# Patient Record
Sex: Female | Born: 1950 | Race: White | Hispanic: No | Smoking: Never smoker
Health system: Southern US, Community
[De-identification: ages and names within clinical notes are randomized; demographics above are authoritative.]

## PROBLEM LIST (undated history)

## (undated) ENCOUNTER — Emergency Department (HOSPITAL_COMMUNITY): Discharge status: Absent without leave | Payer: 59

## (undated) DIAGNOSIS — J705 Respiratory conditions due to smoke inhalation: Secondary | ICD-10-CM

## (undated) DIAGNOSIS — T7422XA Child sexual abuse, confirmed, initial encounter: Secondary | ICD-10-CM

## (undated) DIAGNOSIS — M545 Low back pain, unspecified: Secondary | ICD-10-CM

## (undated) DIAGNOSIS — G43909 Migraine, unspecified, not intractable, without status migrainosus: Secondary | ICD-10-CM

## (undated) DIAGNOSIS — C4490 Unspecified malignant neoplasm of skin, unspecified: Secondary | ICD-10-CM

## (undated) DIAGNOSIS — E785 Hyperlipidemia, unspecified: Secondary | ICD-10-CM

## (undated) DIAGNOSIS — K589 Irritable bowel syndrome without diarrhea: Secondary | ICD-10-CM

## (undated) DIAGNOSIS — M1990 Unspecified osteoarthritis, unspecified site: Secondary | ICD-10-CM

## (undated) DIAGNOSIS — IMO0002 Reserved for concepts with insufficient information to code with codable children: Secondary | ICD-10-CM

## (undated) DIAGNOSIS — M797 Fibromyalgia: Secondary | ICD-10-CM

## (undated) DIAGNOSIS — E039 Hypothyroidism, unspecified: Secondary | ICD-10-CM

## (undated) DIAGNOSIS — I10 Essential (primary) hypertension: Secondary | ICD-10-CM

## (undated) DIAGNOSIS — T39011A Poisoning by aspirin, accidental (unintentional), initial encounter: Secondary | ICD-10-CM

## (undated) DIAGNOSIS — K56609 Unspecified intestinal obstruction, unspecified as to partial versus complete obstruction: Secondary | ICD-10-CM

## (undated) DIAGNOSIS — G610 Guillain-Barre syndrome: Secondary | ICD-10-CM

## (undated) DIAGNOSIS — G8929 Other chronic pain: Secondary | ICD-10-CM

## (undated) DIAGNOSIS — J45909 Unspecified asthma, uncomplicated: Secondary | ICD-10-CM

## (undated) HISTORY — PX: ABDOMINAL HYSTERECTOMY: SHX81

## (undated) HISTORY — PX: APPENDECTOMY: SHX54

## (undated) HISTORY — DX: Other chronic pain: G89.29

## (undated) HISTORY — DX: Unspecified malignant neoplasm of skin, unspecified: C44.90

## (undated) HISTORY — DX: Irritable bowel syndrome, unspecified: K58.9

## (undated) HISTORY — DX: Child sexual abuse, confirmed, initial encounter: T74.22XA

## (undated) HISTORY — PX: OTHER SURGICAL HISTORY: SHX169

## (undated) HISTORY — DX: Unspecified intestinal obstruction, unspecified as to partial versus complete obstruction: K56.609

## (undated) HISTORY — DX: Unspecified osteoarthritis, unspecified site: M19.90

## (undated) HISTORY — DX: Hyperlipidemia, unspecified: E78.5

## (undated) HISTORY — DX: Low back pain, unspecified: M54.50

## (undated) HISTORY — DX: Hypothyroidism, unspecified: E03.9

## (undated) HISTORY — DX: Poisoning by aspirin, accidental (unintentional), initial encounter: T39.011A

## (undated) HISTORY — PX: TONSILLECTOMY: SUR1361

## (undated) HISTORY — DX: Low back pain: M54.5

## (undated) HISTORY — DX: Reserved for concepts with insufficient information to code with codable children: IMO0002

## (undated) HISTORY — DX: Migraine, unspecified, not intractable, without status migrainosus: G43.909

## (undated) HISTORY — DX: Fibromyalgia: M79.7

---

## 1989-04-24 DIAGNOSIS — K56609 Unspecified intestinal obstruction, unspecified as to partial versus complete obstruction: Secondary | ICD-10-CM

## 1989-04-24 HISTORY — DX: Unspecified intestinal obstruction, unspecified as to partial versus complete obstruction: K56.609

## 1997-09-24 ENCOUNTER — Emergency Department (HOSPITAL_COMMUNITY): Admission: EM | Admit: 1997-09-24 | Discharge: 1997-09-24 | Payer: Self-pay

## 1998-07-07 ENCOUNTER — Encounter: Payer: Self-pay | Admitting: *Deleted

## 1998-07-08 ENCOUNTER — Inpatient Hospital Stay (HOSPITAL_COMMUNITY): Admission: EM | Admit: 1998-07-08 | Discharge: 1998-07-09 | Payer: Self-pay | Admitting: *Deleted

## 1998-07-08 ENCOUNTER — Encounter: Payer: Self-pay | Admitting: Internal Medicine

## 1999-10-26 ENCOUNTER — Encounter: Payer: Self-pay | Admitting: Emergency Medicine

## 1999-10-26 ENCOUNTER — Inpatient Hospital Stay (HOSPITAL_COMMUNITY): Admission: EM | Admit: 1999-10-26 | Discharge: 1999-10-31 | Payer: Self-pay | Admitting: Emergency Medicine

## 1999-10-30 ENCOUNTER — Encounter: Payer: Self-pay | Admitting: Pulmonary Disease

## 1999-11-18 ENCOUNTER — Ambulatory Visit (HOSPITAL_COMMUNITY): Admission: RE | Admit: 1999-11-18 | Discharge: 1999-11-18 | Payer: Self-pay | Admitting: Internal Medicine

## 1999-11-20 ENCOUNTER — Encounter (INDEPENDENT_AMBULATORY_CARE_PROVIDER_SITE_OTHER): Payer: Self-pay | Admitting: *Deleted

## 1999-11-22 ENCOUNTER — Encounter: Payer: Self-pay | Admitting: Internal Medicine

## 1999-11-22 ENCOUNTER — Ambulatory Visit (HOSPITAL_COMMUNITY): Admission: RE | Admit: 1999-11-22 | Discharge: 1999-11-22 | Payer: Self-pay | Admitting: Internal Medicine

## 1999-12-19 ENCOUNTER — Emergency Department (HOSPITAL_COMMUNITY): Admission: EM | Admit: 1999-12-19 | Discharge: 1999-12-19 | Payer: Self-pay

## 1999-12-27 ENCOUNTER — Encounter: Payer: Self-pay | Admitting: Internal Medicine

## 1999-12-27 ENCOUNTER — Ambulatory Visit (HOSPITAL_COMMUNITY): Admission: RE | Admit: 1999-12-27 | Discharge: 1999-12-27 | Payer: Self-pay | Admitting: Internal Medicine

## 2000-01-23 ENCOUNTER — Encounter (INDEPENDENT_AMBULATORY_CARE_PROVIDER_SITE_OTHER): Payer: Self-pay | Admitting: *Deleted

## 2000-01-23 ENCOUNTER — Encounter: Payer: Self-pay | Admitting: Surgery

## 2000-01-23 ENCOUNTER — Inpatient Hospital Stay (HOSPITAL_COMMUNITY): Admission: RE | Admit: 2000-01-23 | Discharge: 2000-02-01 | Payer: Self-pay | Admitting: Surgery

## 2000-05-24 ENCOUNTER — Encounter: Admission: RE | Admit: 2000-05-24 | Discharge: 2000-05-24 | Payer: Self-pay | Admitting: Otolaryngology

## 2000-05-24 ENCOUNTER — Encounter: Payer: Self-pay | Admitting: Otolaryngology

## 2000-09-03 ENCOUNTER — Encounter: Payer: Self-pay | Admitting: Emergency Medicine

## 2000-09-03 ENCOUNTER — Emergency Department (HOSPITAL_COMMUNITY): Admission: EM | Admit: 2000-09-03 | Discharge: 2000-09-03 | Payer: Self-pay | Admitting: Emergency Medicine

## 2000-11-21 ENCOUNTER — Other Ambulatory Visit: Admission: RE | Admit: 2000-11-21 | Discharge: 2000-11-21 | Payer: Self-pay | Admitting: Obstetrics and Gynecology

## 2001-02-10 ENCOUNTER — Inpatient Hospital Stay (HOSPITAL_COMMUNITY): Admission: EM | Admit: 2001-02-10 | Discharge: 2001-02-14 | Payer: Self-pay | Admitting: Emergency Medicine

## 2001-02-10 ENCOUNTER — Encounter: Payer: Self-pay | Admitting: Emergency Medicine

## 2001-02-11 ENCOUNTER — Encounter: Payer: Self-pay | Admitting: Family Medicine

## 2001-02-13 ENCOUNTER — Encounter: Payer: Self-pay | Admitting: Internal Medicine

## 2001-09-11 ENCOUNTER — Inpatient Hospital Stay (HOSPITAL_COMMUNITY): Admission: EM | Admit: 2001-09-11 | Discharge: 2001-09-16 | Payer: Self-pay | Admitting: *Deleted

## 2001-09-11 ENCOUNTER — Encounter: Payer: Self-pay | Admitting: Internal Medicine

## 2001-09-14 ENCOUNTER — Encounter: Payer: Self-pay | Admitting: Internal Medicine

## 2001-12-18 ENCOUNTER — Other Ambulatory Visit: Admission: RE | Admit: 2001-12-18 | Discharge: 2001-12-18 | Payer: Self-pay | Admitting: Obstetrics and Gynecology

## 2001-12-24 ENCOUNTER — Ambulatory Visit (HOSPITAL_BASED_OUTPATIENT_CLINIC_OR_DEPARTMENT_OTHER): Admission: RE | Admit: 2001-12-24 | Discharge: 2001-12-24 | Payer: Self-pay | Admitting: Plastic Surgery

## 2001-12-24 ENCOUNTER — Encounter (INDEPENDENT_AMBULATORY_CARE_PROVIDER_SITE_OTHER): Payer: Self-pay | Admitting: Specialist

## 2002-09-10 ENCOUNTER — Encounter: Payer: Self-pay | Admitting: Emergency Medicine

## 2002-09-10 ENCOUNTER — Inpatient Hospital Stay (HOSPITAL_COMMUNITY): Admission: EM | Admit: 2002-09-10 | Discharge: 2002-09-12 | Payer: Self-pay | Admitting: Emergency Medicine

## 2002-09-11 ENCOUNTER — Encounter: Payer: Self-pay | Admitting: *Deleted

## 2003-07-06 ENCOUNTER — Observation Stay (HOSPITAL_COMMUNITY): Admission: EM | Admit: 2003-07-06 | Discharge: 2003-07-07 | Payer: Self-pay

## 2003-07-10 ENCOUNTER — Emergency Department (HOSPITAL_COMMUNITY): Admission: EM | Admit: 2003-07-10 | Discharge: 2003-07-11 | Payer: Self-pay | Admitting: Emergency Medicine

## 2003-08-04 ENCOUNTER — Emergency Department (HOSPITAL_COMMUNITY): Admission: EM | Admit: 2003-08-04 | Discharge: 2003-08-04 | Payer: Self-pay | Admitting: Emergency Medicine

## 2003-08-27 ENCOUNTER — Encounter: Payer: Self-pay | Admitting: Internal Medicine

## 2003-08-28 ENCOUNTER — Encounter (INDEPENDENT_AMBULATORY_CARE_PROVIDER_SITE_OTHER): Payer: Self-pay | Admitting: Specialist

## 2003-08-28 ENCOUNTER — Encounter: Payer: Self-pay | Admitting: Internal Medicine

## 2003-08-28 ENCOUNTER — Ambulatory Visit (HOSPITAL_COMMUNITY): Admission: RE | Admit: 2003-08-28 | Discharge: 2003-08-28 | Payer: Self-pay | Admitting: Internal Medicine

## 2004-01-01 ENCOUNTER — Encounter (INDEPENDENT_AMBULATORY_CARE_PROVIDER_SITE_OTHER): Payer: Self-pay | Admitting: *Deleted

## 2004-01-01 ENCOUNTER — Emergency Department (HOSPITAL_COMMUNITY): Admission: EM | Admit: 2004-01-01 | Discharge: 2004-01-01 | Payer: Self-pay | Admitting: Emergency Medicine

## 2004-01-02 ENCOUNTER — Emergency Department (HOSPITAL_COMMUNITY): Admission: EM | Admit: 2004-01-02 | Discharge: 2004-01-02 | Payer: Self-pay | Admitting: Emergency Medicine

## 2004-01-03 ENCOUNTER — Inpatient Hospital Stay (HOSPITAL_COMMUNITY): Admission: RE | Admit: 2004-01-03 | Discharge: 2004-01-19 | Payer: Self-pay | Admitting: Internal Medicine

## 2004-01-04 ENCOUNTER — Encounter: Payer: Self-pay | Admitting: Gastroenterology

## 2004-01-05 ENCOUNTER — Encounter (INDEPENDENT_AMBULATORY_CARE_PROVIDER_SITE_OTHER): Payer: Self-pay | Admitting: *Deleted

## 2004-01-07 ENCOUNTER — Encounter: Payer: Self-pay | Admitting: Internal Medicine

## 2004-01-07 ENCOUNTER — Encounter (INDEPENDENT_AMBULATORY_CARE_PROVIDER_SITE_OTHER): Payer: Self-pay | Admitting: *Deleted

## 2004-01-12 ENCOUNTER — Encounter (INDEPENDENT_AMBULATORY_CARE_PROVIDER_SITE_OTHER): Payer: Self-pay | Admitting: *Deleted

## 2004-01-19 ENCOUNTER — Encounter (INDEPENDENT_AMBULATORY_CARE_PROVIDER_SITE_OTHER): Payer: Self-pay | Admitting: *Deleted

## 2004-01-27 ENCOUNTER — Emergency Department (HOSPITAL_COMMUNITY): Admission: EM | Admit: 2004-01-27 | Discharge: 2004-01-27 | Payer: Self-pay

## 2004-02-04 ENCOUNTER — Ambulatory Visit: Payer: Self-pay | Admitting: Physician Assistant

## 2004-02-10 ENCOUNTER — Ambulatory Visit (HOSPITAL_COMMUNITY): Admission: RE | Admit: 2004-02-10 | Discharge: 2004-02-10 | Payer: Self-pay | Admitting: Internal Medicine

## 2004-02-10 ENCOUNTER — Encounter: Payer: Self-pay | Admitting: Internal Medicine

## 2004-02-19 ENCOUNTER — Ambulatory Visit: Payer: Self-pay | Admitting: Physician Assistant

## 2004-03-04 ENCOUNTER — Ambulatory Visit: Payer: Self-pay | Admitting: Physician Assistant

## 2004-03-07 ENCOUNTER — Encounter: Admission: RE | Admit: 2004-03-07 | Discharge: 2004-03-07 | Payer: Self-pay

## 2004-03-08 ENCOUNTER — Ambulatory Visit: Payer: Self-pay | Admitting: Physician Assistant

## 2004-04-06 ENCOUNTER — Ambulatory Visit: Payer: Self-pay | Admitting: Physician Assistant

## 2004-05-27 ENCOUNTER — Ambulatory Visit: Payer: Self-pay | Admitting: Physician Assistant

## 2004-06-23 ENCOUNTER — Ambulatory Visit: Payer: Self-pay | Admitting: Physician Assistant

## 2004-07-21 ENCOUNTER — Ambulatory Visit: Payer: Self-pay | Admitting: Physician Assistant

## 2004-09-01 ENCOUNTER — Ambulatory Visit: Payer: Self-pay | Admitting: Physician Assistant

## 2004-10-12 ENCOUNTER — Ambulatory Visit: Payer: Self-pay | Admitting: Physician Assistant

## 2004-11-10 ENCOUNTER — Ambulatory Visit: Payer: Self-pay | Admitting: Physician Assistant

## 2004-12-06 ENCOUNTER — Ambulatory Visit: Payer: Self-pay | Admitting: Internal Medicine

## 2004-12-08 ENCOUNTER — Ambulatory Visit: Payer: Self-pay | Admitting: Physician Assistant

## 2005-01-16 ENCOUNTER — Ambulatory Visit: Payer: Self-pay | Admitting: Physician Assistant

## 2005-01-24 ENCOUNTER — Ambulatory Visit: Payer: Self-pay | Admitting: Physician Assistant

## 2005-02-20 ENCOUNTER — Ambulatory Visit: Payer: Self-pay | Admitting: Internal Medicine

## 2005-02-21 ENCOUNTER — Ambulatory Visit: Payer: Self-pay | Admitting: Physician Assistant

## 2005-03-22 ENCOUNTER — Ambulatory Visit: Payer: Self-pay | Admitting: Physician Assistant

## 2005-04-14 ENCOUNTER — Ambulatory Visit: Payer: Self-pay | Admitting: Physician Assistant

## 2005-04-24 HISTORY — PX: CHOLECYSTECTOMY: SHX55

## 2005-05-22 ENCOUNTER — Ambulatory Visit: Payer: Self-pay | Admitting: Physician Assistant

## 2005-06-02 ENCOUNTER — Ambulatory Visit: Payer: Self-pay | Admitting: Internal Medicine

## 2005-06-23 ENCOUNTER — Ambulatory Visit: Payer: Self-pay | Admitting: Physician Assistant

## 2005-07-12 ENCOUNTER — Inpatient Hospital Stay (HOSPITAL_COMMUNITY): Admission: EM | Admit: 2005-07-12 | Discharge: 2005-07-18 | Payer: Self-pay | Admitting: Emergency Medicine

## 2005-07-12 ENCOUNTER — Ambulatory Visit: Payer: Self-pay | Admitting: Physical Medicine & Rehabilitation

## 2005-08-02 ENCOUNTER — Ambulatory Visit: Payer: Self-pay | Admitting: Physician Assistant

## 2005-08-05 ENCOUNTER — Encounter: Payer: Self-pay | Admitting: Vascular Surgery

## 2005-08-05 ENCOUNTER — Ambulatory Visit: Payer: Self-pay | Admitting: Internal Medicine

## 2005-08-05 ENCOUNTER — Inpatient Hospital Stay (HOSPITAL_COMMUNITY): Admission: EM | Admit: 2005-08-05 | Discharge: 2005-08-08 | Payer: Self-pay | Admitting: Emergency Medicine

## 2005-08-23 ENCOUNTER — Ambulatory Visit: Payer: Self-pay | Admitting: Internal Medicine

## 2005-08-29 ENCOUNTER — Ambulatory Visit: Payer: Self-pay | Admitting: Physician Assistant

## 2005-09-27 ENCOUNTER — Ambulatory Visit: Payer: Self-pay | Admitting: Physician Assistant

## 2005-10-27 ENCOUNTER — Ambulatory Visit: Payer: Self-pay | Admitting: Physician Assistant

## 2005-11-23 ENCOUNTER — Ambulatory Visit: Payer: Self-pay | Admitting: Physician Assistant

## 2006-01-10 ENCOUNTER — Ambulatory Visit: Payer: Self-pay | Admitting: Physician Assistant

## 2006-02-08 ENCOUNTER — Ambulatory Visit: Payer: Self-pay | Admitting: Physician Assistant

## 2006-02-19 ENCOUNTER — Emergency Department (HOSPITAL_COMMUNITY): Admission: EM | Admit: 2006-02-19 | Discharge: 2006-02-19 | Payer: Self-pay | Admitting: Emergency Medicine

## 2006-03-13 ENCOUNTER — Ambulatory Visit: Payer: Self-pay | Admitting: Physician Assistant

## 2006-03-14 ENCOUNTER — Emergency Department: Payer: Self-pay | Admitting: Emergency Medicine

## 2006-03-28 ENCOUNTER — Ambulatory Visit: Payer: Self-pay | Admitting: Internal Medicine

## 2006-04-02 ENCOUNTER — Inpatient Hospital Stay (HOSPITAL_COMMUNITY): Admission: EM | Admit: 2006-04-02 | Discharge: 2006-04-11 | Payer: Self-pay | Admitting: Emergency Medicine

## 2006-04-02 ENCOUNTER — Ambulatory Visit: Payer: Self-pay | Admitting: Internal Medicine

## 2006-04-05 ENCOUNTER — Ambulatory Visit: Payer: Self-pay | Admitting: Physical Medicine & Rehabilitation

## 2006-04-13 ENCOUNTER — Ambulatory Visit: Payer: Self-pay | Admitting: Internal Medicine

## 2006-05-04 ENCOUNTER — Ambulatory Visit: Payer: Self-pay | Admitting: Internal Medicine

## 2006-05-16 ENCOUNTER — Ambulatory Visit: Payer: Self-pay | Admitting: Internal Medicine

## 2006-06-14 ENCOUNTER — Ambulatory Visit: Payer: Self-pay | Admitting: Internal Medicine

## 2006-07-09 ENCOUNTER — Ambulatory Visit: Payer: Self-pay | Admitting: Internal Medicine

## 2006-07-09 LAB — CONVERTED CEMR LAB
Leukocytes, UA: NEGATIVE
Urine Glucose: NEGATIVE mg/dL
Urobilinogen, UA: 0.2 (ref 0.0–1.0)

## 2006-07-18 ENCOUNTER — Ambulatory Visit: Payer: Self-pay | Admitting: Internal Medicine

## 2006-08-27 ENCOUNTER — Ambulatory Visit: Payer: Self-pay | Admitting: Internal Medicine

## 2006-11-15 DIAGNOSIS — F329 Major depressive disorder, single episode, unspecified: Secondary | ICD-10-CM

## 2006-11-15 DIAGNOSIS — Z85828 Personal history of other malignant neoplasm of skin: Secondary | ICD-10-CM

## 2006-11-29 ENCOUNTER — Ambulatory Visit: Payer: Self-pay | Admitting: Internal Medicine

## 2007-03-14 ENCOUNTER — Ambulatory Visit: Payer: Self-pay | Admitting: Internal Medicine

## 2007-03-14 DIAGNOSIS — G9332 Myalgic encephalomyelitis/chronic fatigue syndrome: Secondary | ICD-10-CM | POA: Insufficient documentation

## 2007-03-14 DIAGNOSIS — M797 Fibromyalgia: Secondary | ICD-10-CM

## 2007-03-14 DIAGNOSIS — M51379 Other intervertebral disc degeneration, lumbosacral region without mention of lumbar back pain or lower extremity pain: Secondary | ICD-10-CM | POA: Insufficient documentation

## 2007-03-14 DIAGNOSIS — R5382 Chronic fatigue, unspecified: Secondary | ICD-10-CM

## 2007-03-14 DIAGNOSIS — M5137 Other intervertebral disc degeneration, lumbosacral region: Secondary | ICD-10-CM

## 2007-03-14 DIAGNOSIS — E039 Hypothyroidism, unspecified: Secondary | ICD-10-CM | POA: Insufficient documentation

## 2007-03-14 LAB — CONVERTED CEMR LAB: TSH: 1.55 microintl units/mL (ref 0.35–5.50)

## 2007-04-06 ENCOUNTER — Encounter: Payer: Self-pay | Admitting: Family Medicine

## 2007-04-10 ENCOUNTER — Telehealth: Payer: Self-pay | Admitting: Internal Medicine

## 2007-06-13 ENCOUNTER — Telehealth (INDEPENDENT_AMBULATORY_CARE_PROVIDER_SITE_OTHER): Payer: Self-pay | Admitting: *Deleted

## 2007-06-17 ENCOUNTER — Ambulatory Visit: Payer: Self-pay | Admitting: Internal Medicine

## 2007-06-17 DIAGNOSIS — R635 Abnormal weight gain: Secondary | ICD-10-CM | POA: Insufficient documentation

## 2007-06-17 LAB — CONVERTED CEMR LAB
Amylase: 74 units/L (ref 27–131)
Bilirubin, Direct: 0.2 mg/dL (ref 0.0–0.3)
Lipase: 19 units/L (ref 11.0–59.0)
MCHC: 33.7 g/dL (ref 30.0–36.0)
MCV: 85 fL (ref 78.0–100.0)
RBC: 4.45 M/uL (ref 3.87–5.11)
RDW: 12.9 % (ref 11.5–14.6)
TSH: 3.63 microintl units/mL (ref 0.35–5.50)

## 2007-06-18 ENCOUNTER — Telehealth: Payer: Self-pay | Admitting: Internal Medicine

## 2007-06-18 ENCOUNTER — Encounter: Payer: Self-pay | Admitting: Internal Medicine

## 2007-07-19 ENCOUNTER — Telehealth: Payer: Self-pay | Admitting: Family Medicine

## 2007-07-19 ENCOUNTER — Ambulatory Visit: Payer: Self-pay | Admitting: Internal Medicine

## 2007-07-20 ENCOUNTER — Ambulatory Visit: Payer: Self-pay | Admitting: Family Medicine

## 2007-07-22 ENCOUNTER — Telehealth: Payer: Self-pay | Admitting: Internal Medicine

## 2007-08-23 ENCOUNTER — Ambulatory Visit: Payer: Self-pay | Admitting: Internal Medicine

## 2007-08-23 ENCOUNTER — Inpatient Hospital Stay (HOSPITAL_COMMUNITY): Admission: EM | Admit: 2007-08-23 | Discharge: 2007-08-27 | Payer: Self-pay | Admitting: Emergency Medicine

## 2007-08-23 DIAGNOSIS — R079 Chest pain, unspecified: Secondary | ICD-10-CM

## 2007-08-26 ENCOUNTER — Encounter: Payer: Self-pay | Admitting: Internal Medicine

## 2007-09-12 ENCOUNTER — Encounter: Payer: Self-pay | Admitting: Internal Medicine

## 2007-09-12 ENCOUNTER — Ambulatory Visit: Payer: Self-pay | Admitting: Internal Medicine

## 2007-09-12 ENCOUNTER — Other Ambulatory Visit: Admission: RE | Admit: 2007-09-12 | Discharge: 2007-09-12 | Payer: Self-pay | Admitting: Internal Medicine

## 2007-09-13 ENCOUNTER — Telehealth: Payer: Self-pay | Admitting: Internal Medicine

## 2007-09-17 ENCOUNTER — Ambulatory Visit: Payer: Self-pay | Admitting: Internal Medicine

## 2007-09-26 ENCOUNTER — Encounter: Payer: Self-pay | Admitting: Internal Medicine

## 2007-11-20 ENCOUNTER — Telehealth: Payer: Self-pay | Admitting: Internal Medicine

## 2007-11-22 ENCOUNTER — Ambulatory Visit: Payer: Self-pay | Admitting: Internal Medicine

## 2007-11-22 LAB — CONVERTED CEMR LAB
Calcium: 9.2 mg/dL (ref 8.4–10.5)
GFR calc Af Amer: 84 mL/min
Iron: 66 ug/dL (ref 42–145)
Monocytes Relative: 8 % (ref 3.0–12.0)
Neutrophils Relative %: 74 % (ref 43.0–77.0)
Platelets: 255 10*3/uL (ref 150–400)
Sodium: 137 meq/L (ref 135–145)
TSH: 4.03 microintl units/mL (ref 0.35–5.50)
Transferrin: 252.5 mg/dL (ref >=212.0)
WBC: 6.8 10*3/uL (ref 4.5–10.5)

## 2007-11-25 ENCOUNTER — Encounter: Payer: Self-pay | Admitting: Internal Medicine

## 2007-12-31 ENCOUNTER — Ambulatory Visit: Payer: Self-pay | Admitting: Internal Medicine

## 2008-01-08 ENCOUNTER — Ambulatory Visit (HOSPITAL_COMMUNITY): Admission: RE | Admit: 2008-01-08 | Discharge: 2008-01-08 | Payer: Self-pay | Admitting: Internal Medicine

## 2008-01-09 ENCOUNTER — Encounter: Payer: Self-pay | Admitting: Internal Medicine

## 2008-02-03 ENCOUNTER — Ambulatory Visit: Payer: Self-pay | Admitting: Internal Medicine

## 2008-02-03 DIAGNOSIS — B029 Zoster without complications: Secondary | ICD-10-CM | POA: Insufficient documentation

## 2008-05-13 ENCOUNTER — Ambulatory Visit: Payer: Self-pay | Admitting: Internal Medicine

## 2008-08-25 ENCOUNTER — Telehealth: Payer: Self-pay | Admitting: Internal Medicine

## 2008-09-22 ENCOUNTER — Ambulatory Visit: Payer: Self-pay | Admitting: Internal Medicine

## 2008-09-22 ENCOUNTER — Telehealth (INDEPENDENT_AMBULATORY_CARE_PROVIDER_SITE_OTHER): Payer: Self-pay | Admitting: *Deleted

## 2008-09-22 DIAGNOSIS — R498 Other voice and resonance disorders: Secondary | ICD-10-CM

## 2008-09-22 DIAGNOSIS — L500 Allergic urticaria: Secondary | ICD-10-CM

## 2008-09-22 LAB — CONVERTED CEMR LAB
ALT: 66 units/L — ABNORMAL HIGH (ref 0–35)
Albumin: 3.8 g/dL (ref 3.5–5.2)
BUN: 13 mg/dL (ref 6–23)
Basophils Relative: 0 % (ref 0.0–3.0)
Chloride: 107 meq/L (ref 96–112)
Cholesterol: 444 mg/dL — ABNORMAL HIGH (ref 0–200)
Creatinine, Ser: 0.8 mg/dL (ref 0.4–1.2)
Direct LDL: 136.1 mg/dL
Eosinophils Relative: 2.5 % (ref 0.0–5.0)
Glucose, Bld: 102 mg/dL — ABNORMAL HIGH (ref 70–99)
HDL: 35.7 mg/dL — ABNORMAL LOW (ref >39.00)
Lymphocytes Relative: 40.9 % (ref 12.0–46.0)
MCV: 84.6 fL (ref 78.0–100.0)
Monocytes Relative: 8.7 % (ref 3.0–12.0)
Neutrophils Relative %: 47.9 % (ref 43.0–77.0)
RBC: 4.1 M/uL (ref 3.87–5.11)
Total Bilirubin: 1.2 mg/dL (ref 0.3–1.2)
Total CHOL/HDL Ratio: 12
Total Protein: 7.6 g/dL (ref 6.0–8.3)
Triglycerides: 751 mg/dL — ABNORMAL HIGH (ref 0.0–149.0)
Vitamin B-12: 1305 pg/mL — ABNORMAL HIGH (ref 211–911)
WBC: 16.7 10*3/uL — ABNORMAL HIGH (ref 4.5–10.5)

## 2008-09-23 ENCOUNTER — Telehealth: Payer: Self-pay | Admitting: Internal Medicine

## 2008-09-24 ENCOUNTER — Ambulatory Visit: Payer: Self-pay | Admitting: Internal Medicine

## 2008-09-24 LAB — CONVERTED CEMR LAB
Amphetamine Screen, Ur: NEGATIVE
Marijuana Metabolite: NEGATIVE
Methadone: POSITIVE — AB
Nitrite: NEGATIVE
Opiates: NEGATIVE
Propoxyphene: NEGATIVE
Sed Rate: 35 mm/hr — ABNORMAL HIGH (ref 0–22)
Specific Gravity, Urine: <=1.005 (ref 1.000–1.030)
Urobilinogen, UA: 0.2 (ref 0.0–1.0)
pH: 6 (ref 5.0–8.0)

## 2008-09-25 ENCOUNTER — Telehealth (INDEPENDENT_AMBULATORY_CARE_PROVIDER_SITE_OTHER): Payer: Self-pay | Admitting: *Deleted

## 2008-10-01 ENCOUNTER — Ambulatory Visit: Payer: Self-pay | Admitting: Internal Medicine

## 2008-10-01 DIAGNOSIS — R740 Nonspecific elevation of levels of transaminase and lactic acid dehydrogenase [LDH]: Secondary | ICD-10-CM

## 2008-10-05 ENCOUNTER — Telehealth: Payer: Self-pay | Admitting: Internal Medicine

## 2008-10-13 ENCOUNTER — Telehealth: Payer: Self-pay | Admitting: Internal Medicine

## 2008-10-30 ENCOUNTER — Telehealth: Payer: Self-pay | Admitting: Internal Medicine

## 2008-11-26 ENCOUNTER — Ambulatory Visit: Payer: Self-pay | Admitting: Internal Medicine

## 2008-11-26 DIAGNOSIS — J40 Bronchitis, not specified as acute or chronic: Secondary | ICD-10-CM | POA: Insufficient documentation

## 2008-11-27 ENCOUNTER — Ambulatory Visit: Payer: Self-pay | Admitting: Internal Medicine

## 2008-11-27 ENCOUNTER — Telehealth: Payer: Self-pay | Admitting: Internal Medicine

## 2008-11-27 LAB — CONVERTED CEMR LAB
Anti Nuclear Antibody(ANA): NEGATIVE
Basophils Relative: 0 % (ref 0.0–3.0)
Hemoglobin: 12.4 g/dL (ref 12.0–15.0)
INR: 1 (ref 0.8–1.0)
Lymphocytes Relative: 34.1 % (ref 12.0–46.0)
MCHC: 34.9 g/dL (ref 30.0–36.0)
MCV: 84.7 fL (ref 78.0–100.0)
Neutrophils Relative %: 58.5 % (ref 43.0–77.0)
RBC: 4.21 M/uL (ref 3.87–5.11)
RDW: 13.9 % (ref 11.5–14.6)
aPTT: 28.5 s (ref 21.7–28.8)

## 2008-12-07 ENCOUNTER — Telehealth: Payer: Self-pay | Admitting: Internal Medicine

## 2008-12-10 ENCOUNTER — Ambulatory Visit: Payer: Self-pay | Admitting: Internal Medicine

## 2008-12-16 ENCOUNTER — Ambulatory Visit: Payer: Self-pay | Admitting: Pulmonary Disease

## 2008-12-17 ENCOUNTER — Ambulatory Visit: Payer: Self-pay | Admitting: Cardiovascular Disease

## 2008-12-25 ENCOUNTER — Encounter: Payer: Self-pay | Admitting: Pulmonary Disease

## 2008-12-25 ENCOUNTER — Ambulatory Visit (HOSPITAL_COMMUNITY): Admission: RE | Admit: 2008-12-25 | Discharge: 2008-12-25 | Payer: Self-pay | Admitting: Neurology

## 2008-12-29 ENCOUNTER — Ambulatory Visit: Payer: Self-pay | Admitting: Pulmonary Disease

## 2008-12-31 ENCOUNTER — Ambulatory Visit: Payer: Self-pay | Admitting: Internal Medicine

## 2009-01-01 ENCOUNTER — Telehealth: Payer: Self-pay | Admitting: Internal Medicine

## 2009-03-23 ENCOUNTER — Ambulatory Visit: Payer: Self-pay | Admitting: Internal Medicine

## 2009-03-23 DIAGNOSIS — R1013 Epigastric pain: Secondary | ICD-10-CM

## 2009-03-23 DIAGNOSIS — K3189 Other diseases of stomach and duodenum: Secondary | ICD-10-CM

## 2009-03-23 LAB — CONVERTED CEMR LAB
ALT: 33 units/L (ref 0–35)
AST: 34 units/L (ref 0–37)
Alkaline Phosphatase: 94 units/L (ref 39–117)
Basophils Absolute: 0 10*3/uL (ref 0.0–0.1)
Basophils Relative: 0.5 % (ref 0.0–3.0)
Eosinophils Absolute: 0.2 10*3/uL (ref 0.0–0.7)
HCT: 40.6 % (ref 36.0–46.0)
Hemoglobin: 13.5 g/dL (ref 12.0–15.0)
Lymphocytes Relative: 27 % (ref 12.0–46.0)
Lymphs Abs: 2.3 10*3/uL (ref 0.7–4.0)
MCHC: 33.2 g/dL (ref 30.0–36.0)
MCV: 87.4 fL (ref 78.0–100.0)
Monocytes Absolute: 0.5 10*3/uL (ref 0.1–1.0)
Neutro Abs: 5.7 10*3/uL (ref 1.4–7.7)
RBC: 4.65 M/uL (ref 3.87–5.11)
RDW: 13.2 % (ref 11.5–14.6)
Total Bilirubin: 0.5 mg/dL (ref 0.3–1.2)
Total CHOL/HDL Ratio: 9
Triglycerides: 976 mg/dL — ABNORMAL HIGH (ref 0.0–149.0)

## 2009-03-28 ENCOUNTER — Encounter: Payer: Self-pay | Admitting: Internal Medicine

## 2009-04-02 ENCOUNTER — Telehealth: Payer: Self-pay | Admitting: Internal Medicine

## 2009-04-27 ENCOUNTER — Encounter (INDEPENDENT_AMBULATORY_CARE_PROVIDER_SITE_OTHER): Payer: Self-pay | Admitting: *Deleted

## 2009-04-30 ENCOUNTER — Telehealth: Payer: Self-pay | Admitting: Internal Medicine

## 2009-05-01 ENCOUNTER — Ambulatory Visit: Payer: Self-pay | Admitting: Internal Medicine

## 2009-05-01 ENCOUNTER — Encounter (INDEPENDENT_AMBULATORY_CARE_PROVIDER_SITE_OTHER): Payer: Self-pay | Admitting: *Deleted

## 2009-05-01 ENCOUNTER — Telehealth: Payer: Self-pay | Admitting: Internal Medicine

## 2009-05-02 ENCOUNTER — Ambulatory Visit: Payer: Self-pay | Admitting: Internal Medicine

## 2009-05-02 ENCOUNTER — Inpatient Hospital Stay (HOSPITAL_COMMUNITY): Admission: EM | Admit: 2009-05-02 | Discharge: 2009-05-03 | Payer: Self-pay | Admitting: Emergency Medicine

## 2009-05-03 ENCOUNTER — Encounter: Payer: Self-pay | Admitting: Internal Medicine

## 2009-05-10 ENCOUNTER — Telehealth: Payer: Self-pay | Admitting: Internal Medicine

## 2009-05-11 ENCOUNTER — Ambulatory Visit: Payer: Self-pay | Admitting: Gastroenterology

## 2009-05-11 DIAGNOSIS — F411 Generalized anxiety disorder: Secondary | ICD-10-CM | POA: Insufficient documentation

## 2009-05-11 DIAGNOSIS — K219 Gastro-esophageal reflux disease without esophagitis: Secondary | ICD-10-CM

## 2009-05-11 DIAGNOSIS — R159 Full incontinence of feces: Secondary | ICD-10-CM | POA: Insufficient documentation

## 2009-05-11 DIAGNOSIS — K589 Irritable bowel syndrome without diarrhea: Secondary | ICD-10-CM | POA: Insufficient documentation

## 2009-05-12 ENCOUNTER — Telehealth: Payer: Self-pay | Admitting: Physician Assistant

## 2009-05-13 ENCOUNTER — Telehealth: Payer: Self-pay | Admitting: Physician Assistant

## 2009-05-31 ENCOUNTER — Telehealth: Payer: Self-pay | Admitting: Internal Medicine

## 2009-06-01 ENCOUNTER — Ambulatory Visit: Payer: Self-pay | Admitting: Gastroenterology

## 2009-06-14 ENCOUNTER — Encounter (INDEPENDENT_AMBULATORY_CARE_PROVIDER_SITE_OTHER): Payer: Self-pay | Admitting: *Deleted

## 2009-06-21 ENCOUNTER — Telehealth: Payer: Self-pay | Admitting: Internal Medicine

## 2009-06-21 ENCOUNTER — Ambulatory Visit: Payer: Self-pay | Admitting: Internal Medicine

## 2009-06-22 DIAGNOSIS — T39011A Poisoning by aspirin, accidental (unintentional), initial encounter: Secondary | ICD-10-CM

## 2009-06-22 HISTORY — DX: Poisoning by aspirin, accidental (unintentional), initial encounter: T39.011A

## 2009-06-30 ENCOUNTER — Emergency Department (HOSPITAL_COMMUNITY): Admission: EM | Admit: 2009-06-30 | Discharge: 2009-06-30 | Payer: Self-pay | Admitting: Emergency Medicine

## 2009-06-30 ENCOUNTER — Telehealth: Payer: Self-pay | Admitting: Internal Medicine

## 2009-06-30 ENCOUNTER — Encounter (INDEPENDENT_AMBULATORY_CARE_PROVIDER_SITE_OTHER): Payer: Self-pay | Admitting: *Deleted

## 2009-07-01 ENCOUNTER — Telehealth: Payer: Self-pay | Admitting: Internal Medicine

## 2009-07-01 ENCOUNTER — Encounter (INDEPENDENT_AMBULATORY_CARE_PROVIDER_SITE_OTHER): Payer: Self-pay | Admitting: *Deleted

## 2009-07-01 ENCOUNTER — Emergency Department (HOSPITAL_COMMUNITY): Admission: EM | Admit: 2009-07-01 | Discharge: 2009-07-01 | Payer: Self-pay | Admitting: Emergency Medicine

## 2009-07-02 ENCOUNTER — Telehealth: Payer: Self-pay | Admitting: Internal Medicine

## 2009-07-05 ENCOUNTER — Telehealth: Payer: Self-pay | Admitting: Internal Medicine

## 2009-07-06 ENCOUNTER — Telehealth: Payer: Self-pay | Admitting: Internal Medicine

## 2009-07-08 ENCOUNTER — Other Ambulatory Visit: Payer: Self-pay | Admitting: Emergency Medicine

## 2009-07-08 ENCOUNTER — Ambulatory Visit: Payer: Self-pay | Admitting: Internal Medicine

## 2009-07-08 ENCOUNTER — Ambulatory Visit: Payer: Self-pay | Admitting: Critical Care Medicine

## 2009-07-08 ENCOUNTER — Other Ambulatory Visit: Payer: Self-pay

## 2009-07-08 ENCOUNTER — Encounter (INDEPENDENT_AMBULATORY_CARE_PROVIDER_SITE_OTHER): Payer: Self-pay | Admitting: *Deleted

## 2009-07-08 ENCOUNTER — Inpatient Hospital Stay (HOSPITAL_COMMUNITY): Admission: EM | Admit: 2009-07-08 | Discharge: 2009-07-11 | Payer: Self-pay | Admitting: Critical Care Medicine

## 2009-07-08 ENCOUNTER — Telehealth: Payer: Self-pay | Admitting: Internal Medicine

## 2009-07-09 ENCOUNTER — Ambulatory Visit: Payer: Self-pay | Admitting: Internal Medicine

## 2009-07-10 ENCOUNTER — Encounter (INDEPENDENT_AMBULATORY_CARE_PROVIDER_SITE_OTHER): Payer: Self-pay | Admitting: *Deleted

## 2009-07-11 ENCOUNTER — Ambulatory Visit: Payer: Self-pay | Admitting: Psychiatry

## 2009-07-11 ENCOUNTER — Inpatient Hospital Stay (HOSPITAL_COMMUNITY): Admission: RE | Admit: 2009-07-11 | Discharge: 2009-07-13 | Payer: Self-pay | Admitting: Psychiatry

## 2009-07-13 ENCOUNTER — Encounter (INDEPENDENT_AMBULATORY_CARE_PROVIDER_SITE_OTHER): Payer: Self-pay | Admitting: *Deleted

## 2009-07-27 ENCOUNTER — Ambulatory Visit: Payer: Self-pay | Admitting: Internal Medicine

## 2009-08-09 ENCOUNTER — Encounter: Payer: Self-pay | Admitting: Internal Medicine

## 2009-08-09 ENCOUNTER — Ambulatory Visit: Payer: Self-pay | Admitting: Internal Medicine

## 2009-11-05 ENCOUNTER — Ambulatory Visit: Payer: Self-pay | Admitting: Internal Medicine

## 2009-12-01 ENCOUNTER — Telehealth: Payer: Self-pay | Admitting: Internal Medicine

## 2009-12-02 ENCOUNTER — Ambulatory Visit: Payer: Self-pay | Admitting: Internal Medicine

## 2009-12-02 LAB — CONVERTED CEMR LAB
BUN: 15 mg/dL (ref 6–23)
Basophils Absolute: 0 10*3/uL (ref 0.0–0.1)
CO2: 31 meq/L (ref 19–32)
Chloride: 101 meq/L (ref 96–112)
Eosinophils Relative: 1.6 % (ref 0.0–5.0)
HCT: 35.6 % — ABNORMAL LOW (ref 36.0–46.0)
Hemoglobin: 11.9 g/dL — ABNORMAL LOW (ref 12.0–15.0)
Lymphs Abs: 2 10*3/uL (ref 0.7–4.0)
MCV: 86.8 fL (ref 78.0–100.0)
Monocytes Absolute: 0.6 10*3/uL (ref 0.1–1.0)
Monocytes Relative: 8.3 % (ref 3.0–12.0)
Neutro Abs: 4.4 10*3/uL (ref 1.4–7.7)
Potassium: 4.4 meq/L (ref 3.5–5.1)
RDW: 14.1 % (ref 11.5–14.6)
TSH: 2.87 microintl units/mL (ref 0.35–5.50)
Vitamin B-12: 1328 pg/mL — ABNORMAL HIGH (ref 211–911)

## 2009-12-06 ENCOUNTER — Telehealth (INDEPENDENT_AMBULATORY_CARE_PROVIDER_SITE_OTHER): Payer: Self-pay | Admitting: *Deleted

## 2009-12-09 ENCOUNTER — Telehealth: Payer: Self-pay | Admitting: Internal Medicine

## 2009-12-21 ENCOUNTER — Encounter: Payer: Self-pay | Admitting: Internal Medicine

## 2009-12-24 ENCOUNTER — Ambulatory Visit (HOSPITAL_COMMUNITY): Admission: RE | Admit: 2009-12-24 | Discharge: 2009-12-24 | Payer: Self-pay | Admitting: Neurology

## 2009-12-27 ENCOUNTER — Emergency Department (HOSPITAL_COMMUNITY): Admission: EM | Admit: 2009-12-27 | Discharge: 2009-12-27 | Payer: Self-pay | Admitting: Emergency Medicine

## 2010-02-03 ENCOUNTER — Telehealth: Payer: Self-pay | Admitting: Internal Medicine

## 2010-02-04 ENCOUNTER — Ambulatory Visit: Payer: Self-pay | Admitting: Internal Medicine

## 2010-02-04 LAB — CONVERTED CEMR LAB
BUN: 12 mg/dL (ref 6–23)
Basophils Absolute: 0 10*3/uL (ref 0.0–0.1)
Bilirubin, Direct: 0 mg/dL (ref 0.0–0.3)
CO2: 29 meq/L (ref 19–32)
Chloride: 101 meq/L (ref 96–112)
Creatinine, Ser: 1 mg/dL (ref 0.4–1.2)
Eosinophils Absolute: 0.1 10*3/uL (ref 0.0–0.7)
MCHC: 33.7 g/dL (ref 30.0–36.0)
MCV: 86.6 fL (ref 78.0–100.0)
Monocytes Absolute: 0.5 10*3/uL (ref 0.1–1.0)
Neutrophils Relative %: 59.4 % (ref 43.0–77.0)
Platelets: 232 10*3/uL (ref 150.0–400.0)
Specific Gravity, Urine: 1.015 (ref 1.000–1.030)
Total Bilirubin: 0.5 mg/dL (ref 0.3–1.2)
Total Protein, Urine: NEGATIVE mg/dL
Urine Glucose: NEGATIVE mg/dL
Urobilinogen, UA: 0.2 (ref 0.0–1.0)

## 2010-02-06 ENCOUNTER — Telehealth: Payer: Self-pay | Admitting: Internal Medicine

## 2010-02-08 ENCOUNTER — Ambulatory Visit: Payer: Self-pay | Admitting: Internal Medicine

## 2010-02-09 ENCOUNTER — Encounter: Payer: Self-pay | Admitting: Internal Medicine

## 2010-02-09 LAB — CONVERTED CEMR LAB: Hep B C IgM: NEGATIVE

## 2010-02-11 ENCOUNTER — Telehealth: Payer: Self-pay | Admitting: Internal Medicine

## 2010-02-14 LAB — CONVERTED CEMR LAB: Hep B Core Total Ab: NEGATIVE

## 2010-04-01 ENCOUNTER — Telehealth: Payer: Self-pay | Admitting: Internal Medicine

## 2010-04-15 ENCOUNTER — Telehealth: Payer: Self-pay | Admitting: Internal Medicine

## 2010-05-14 ENCOUNTER — Encounter: Payer: Self-pay | Admitting: Family Medicine

## 2010-05-24 NOTE — Procedures (Signed)
Summary: [facility]GD[facility]  [facility]GD/[facility]   Imported By: Sherian Rein 05/13/2009 14:50:42  _____________________________________________________________________  External Attachment:    Type:   Image     Comment:   External Document

## 2010-05-24 NOTE — Letter (Signed)
Summary: [facility]  [facility]   Imported By: Lester Altavista 12/28/2009 10:00:33  _____________________________________________________________________  External Attachment:    Type:   Image     Comment:   External Document

## 2010-05-24 NOTE — Procedures (Signed)
Summary: Colonoscopy/[facility]  Colonoscopy/[facility]   Imported By: Sherian Rein 05/13/2009 14:49:31  _____________________________________________________________________  External Attachment:    Type:   Image     Comment:   External Document
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
## 2010-05-24 NOTE — Progress Notes (Signed)
Summary: FALL  Phone Note Call from Patient Call back at [facility] Phone [phone]   Summary of Call: Pt slipped & fell on wet kitchen floor 4 days ago. She did have some LOC per her report. Has knot on her head and some back pain. C/o some dizzyness & h/a after fall but better today. No vision complaints. Today she "feels good". She did have friends sit up w/her and watch over her for 3 days following the injury.  Initial call taken by: Lamar Sprinkles, CMA,  April 01, 2010 2:29 PM  Follow-up for Phone Call        MD informed, ok to monitor and ER if change in symptoms, Pt informed  Follow-up by: Lamar Sprinkles, CMA,  April 01, 2010 2:31 PM

## 2010-05-24 NOTE — Discharge Summary (Signed)
Summary: Inpatient Psychiatric Admission    NAME:  [name], [name]                 ACCOUNT NO.:  [id number]      MEDICAL RECORD NO.:  [id number]          PATIENT TYPE:  IPS      LOCATION:  0400                          FACILITY:  [facility]      PHYSICIAN:  Anselm Jungling, MD  DATE OF BIRTH:  Dec 21, 1950      DATE OF ADMISSION:  07/11/2009   DATE OF DISCHARGE:  07/13/2009                                  DISCHARGE SUMMARY      IDENTIFYING DATA AND REASON FOR ADMISSION:  This was an inpatient   psychiatric admission for [name], a 60 year old unmarried Caucasian   female who was referred by her primary care physician, Dr. Arthur Holms.  She   was referred because of salicylate overdose.  Please refer to the   admission note for further details pertaining to the symptoms,   circumstances and history that led to her hospitalization.  She was   given an initial Axis I diagnosis of major depressive disorder,   recurrent.      MEDICAL AND LABORATORY:  The patient had a significant aspirin overdose,   and she was treated for this in the medical center.  She came to [place] with   a history of multiple medical problems, many of them gastrointestinal in   nature related to previous bowel surgery.  Patient also claimed a   history of fibromyalgia and migraine headaches which she had during her   stay.  She was medically and physically cleared at the medical center   before transfer to inpatient psychiatry.  Her care was then reviewed by   the psychiatric nurse practitioner.  She was continued on her usual non-   psychotropic regimen of cholestyramine, multivitamin, Co-Q, fish oil,   gemfibrozil, levothyroxine, Prilosec, sumatriptan, and, for pain,   Ultram.  Patient had also been on methadone for pain control prescribed   by Dr. Arthur Holms.      The undersigned discussed the patient's situation and care with Dr.   Arthur Holms on July 12, 2009.  He indicated that in his opinion, the   patient had not been misusing  her prescription methadone or other   medications.  He explained that he had placed her on methadone for pain   relief because OxyContin and other preparations were becoming   unaffordable for her.      Aside from her migraine headache which she claimed to have during her   entire 3-day stay with [place], there were no significant medical issues.      HOSPITAL COURSE:  The patient was admitted to the adult inpatient   psychiatric service.  She presented as a well-nourished, normally-   developed adult female who appeared tired and depressed.  She initially   denied that her overdose was either intentional or a suicide attempt.   However, she could not account for it.  Referral information indicated   that she had written a suicide note as well.  She denied that this was a   suicide note, and, instead, stated that  it was just a note to myself.   However, this was not the impression that her note left in the minds of   others who had seen it.      The patient indicated right away that she wished for discharge from the   hospital, stating that did not feel it was necessary to be hospitalized   psychiatrically.  We chose to have her remain in the hospital on the   night of July 12, 2009, while we gathered more information.      After speaking with Dr. Arthur Holms on July 12, 2009, the undersigned   determined that it was in the best interest of the patient's safety to   have her remain further.  In addition, on July 12, 2009, our case   manager spoke with the patient's mother who was en route from out of   state to assist her daughter in her crisis.      On the morning of July 13, 2009, the patient appeared much better.  She   was calm, relaxed, and appeared well rested.  She indicated that   although she did not have any clear intention of living or dying at the   time she took the aspirin, she did take the aspirin impulsively in an   act of desperation.  This followed her reaching a point  of peak   frustration over her ongoing chronic medical problems and what she   describes as an unpleasant interaction with one of the staff at her   gastroenterologist's office on the phone.      The patient also indicated that although her mother was to arrive later   that evening, she had arranged for a friend to stay with her   continuously until her mother arrived.  She requested discharge.      At this time, the patient was maintaining that she was still having   severe migraine headache, and she did not feel that the migraine   headache would remit in the psychiatric hospital.  She also felt that   she could not achieve any control over her bowel activity in the   psychiatric hospital, and she needed to be at home to do this.  Because   of these physical issues, the patient did not participate meaningfully   in any part of the treatment program.  She was indicating that with   these conditions continuing, she would probably not be able to.      Given the fact that the patient had arranged for a close friend to   remain with her in the hours between discharge and her mother's arrival   from out of town, we felt it was reasonable for her to be discharged.   She indicated that she would comply with follow-up recommendations,   including for psychotropic medication and individual psychotherapy.  She   agreed to the following aftercare plan.   AFTERCARE:  Patient will follow up at [facility] with   an appointment on July 15, 2009, at 11:00 a.m., and at [facility] on July 23, 2009, at 1:30 p.m.      DISCHARGE MEDICATIONS:   1. Amitriptyline 100 mg at bedtime.   2. Valium 5 mg t.i.d. as needed for anxiety.   3. Neurontin 300 mg at bedtime.   4. Centrum multivitamin daily.   5. Cholestyramine 4 grams p.o. b.i.d.   6. Co-Q10 daily.   7. Fish oil 3  caps daily.   8. Gemfibrozil 600 mg b.i.d.   9. Levothyroxine 50 mcg daily.   10.Prilosec 40 mg  daily.   11.Sumatriptan 100 mg daily.   12.Ultram 50 mg t.i.d. p.r.n. pain.      DISCHARGE DIAGNOSES:  AXIS I:  Mood disorder, not otherwise specified.   AXIS II:  Personality disorder, not otherwise specified.   AXIS III:  History of fibromyalgia, multiple gastrointestinal disorders,   migraine, reflux, hypothyroidism.   AXIS IV:  Stressors:  Severe.   AXIS V:  Global Assessment of Functioning on discharge 50.               Anselm Jungling, MD            SPB/MEDQ  D:  07/13/2009  T:  07/13/2009  Job:  [id number]      Electronically Signed by Geralyn Flash MD on 07/14/2009 10:47:38 AM
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
## 2010-05-24 NOTE — Procedures (Signed)
Summary: EGD   EGD  Procedure date:  08/27/2003  Findings:      Location: [facility]   Patient Name: [name], [name] MRN: [id number] Procedure Procedures: Panendoscopy (EGD) CPT: 43235.    with biopsy(s)/brushing(s). CPT: D1846139.  Personnel: Endoscopist: Mechell Girgis L. Juanda Chance, MD.  Exam Location: Exam performed in Outpatient Clinic. Outpatient  Patient Consent: Procedure, Alternatives, Risks and Benefits discussed, consent obtained, from patient. Consent was obtained by the RN.  Indications Symptoms: Weight loss. Vomiting. Abdominal pain, location: epigastric. Hematemesis. Last bleeding episode >48 hours ago,  Surveillance of: 1996.  History  Current Medications: Patient is not currently taking Coumadin.  Pre-Exam Physical: Performed Aug 27, 2003  Cardio-pulmonary exam, HEENT exam, Abdominal exam, Extremity exam, Neurological exam, Mental status exam WNL.  Exam Exam Info: Maximum depth of insertion Duodenum, intended Duodenum. Vocal cords visualized. Gastric retroflexion performed. Images taken. ASA Classification: I. Tolerance: good.  Sedation Meds: Patient assessed and found to be appropriate for moderate (conscious) sedation. Fentanyl 100 mcg. given IV. Versed 10 mg. given IV. Cetacaine Spray 2 sprays given aerosolized.  Monitoring: BP and pulse monitoring done. Oximetry used. Supplemental O2 given  Findings DIAGNOSTIC TEST: from Body. RUT done, results pending  DIAGNOSTIC TEST: Biopsies taken. from Duodenal Apex. Reason: r/o  villous atrophy. Comments: normal appearing.   Assessment Normal examination.  Comments: nothing to account for patients's symptoms Events  Unplanned Intervention: No unplanned interventions were required.  Unplanned Events: There were no complications. Plans Medication(s): Await pathology. PPI: Esomeprazole/Nexium 40 mg QD, starting Aug 27, 2003  Promotility: Metaclopramide 5 TID, starting Aug 27, 2003  Phenergan: 25mg  prn,  starting Aug 27, 2003   Comments: colonoscopy tomorrow, then CT scan of the abdomen tlo assess abdominal distention Disposition: After procedure patient sent to recovery. After recovery patient sent home.   This report was created from the original endoscopy report, which was reviewed and signed by the above listed endoscopist.

## 2010-05-24 NOTE — Letter (Signed)
Summary: New Patient letter  [facility]  [address] [place], [place] [address]   Phone: [phone]  Fax: [phone]       06/14/2009 MRN: [id number]  [facility] [address] [place], [place]  [address]  Dear Ms. Siverson,  Welcome to the Gastroenterology Division at [facility].    You are scheduled to see Dr. Juanda Chance on 08-09-09 at 9:15a.m. on the 3rd floor at [facility], [address].  We ask that you try to arrive at our office 15 minutes prior to your appointment time to allow for check-in.  We would like you to complete the enclosed self-administered evaluation form prior to your visit and bring it with you on the day of your appointment.  We will review it with you.  Also, please bring a complete list of all your medications or, if you prefer, bring the medication bottles and we will list them.  Please bring your insurance card so that we may make a copy of it.  If your insurance requires a referral to see a specialist, please bring your referral form from your primary care physician.  Co-payments are due at the time of your visit and may be paid by cash, check or credit card.     Your office visit will consist of a consult with your physician (includes a physical exam), any laboratory testing he/she may order, scheduling of any necessary diagnostic testing (e.g. x-ray, ultrasound, CT-scan), and scheduling of a procedure (e.g. Endoscopy, Colonoscopy) if required.  Please allow enough time on your schedule to allow for any/all of these possibilities.    If you cannot keep your appointment, please call [phone] to cancel or reschedule prior to your appointment date.  This allows [place] the opportunity to schedule an appointment for another patient in need of care.  If you do not cancel or reschedule by 5 p.m. the business day prior to your appointment date, you will be charged a $50.00 late cancellation/no-show fee.    Thank you for choosing  [facility] for your medical needs.  We appreciate the opportunity to care for you.  Please visit [place] at our website  to learn more about our practice.                     Sincerely,                                                             The Gastroenterology Division

## 2010-05-24 NOTE — Progress Notes (Signed)
  Phone Note Outgoing Call   Reason for Call: Discuss lab or test results Summary of Call: please call patient - U/A no bacteria but blood and WBCs suggestive of cystitis. Rx for cipro 250mg  two times a day x 5 sent to [facility]. Liver functions wtih mild elevation - will need chronic hepatitis panel: Hep B antibody and Hep C antibody  790.6. please have her return to lab.  Thanks Initial call taken by: Jacques Navy MD,  February 06, 2010 2:15 PM  Follow-up for Phone Call        called pt, no answer will try back later Follow-up by: Ami Bullins CMA,  February 07, 2010 9:57 AM  Additional Follow-up for Phone Call Additional follow up Details #1::        informed pt and labs put in IDX Additional Follow-up by: Ami Bullins CMA,  February 07, 2010 2:13 PM
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
## 2010-05-24 NOTE — Progress Notes (Signed)
  Phone Note Other Incoming   Caller: pt Summary of Call: [facility] does not have anything until oct. Pt wants to get in sooner than that. They told her that MD needed to call if he felt like pt needs to be seen before then. Initial call taken by: Ami Bullins CMA,  December 09, 2009 9:35 AM  Follow-up for Phone Call        Do not think that this is an emergency. We can ask that she be put on their cancellation list.  Follow-up by: Jacques Navy MD,  December 09, 2009 9:51 AM  Additional Follow-up for Phone Call Additional follow up Details #1::        informed pt Additional Follow-up by: Ami Bullins CMA,  December 10, 2009 10:16 AM

## 2010-05-24 NOTE — Progress Notes (Signed)
Summary: GI APT  Phone Note Call from Patient   Summary of Call: Pt c/o blood in her stool and at times when she vomits. Pt feels that Dr Debby Bud is aware of this. She is req that Dr Debby Bud get her in to see GI sooner, apt is 2/21. Initial call taken by: Lamar Sprinkles, CMA,  April 30, 2009 1:14 PM  Follow-up for Phone Call        [facility] notified Follow-up by: Jacques Navy MD,  April 30, 2009 5:13 PM

## 2010-05-24 NOTE — Letter (Signed)
Summary: New Patient letter  [facility]  [address] [place], [place] [address]   Phone: [phone]  Fax: [phone]       04/27/2009 MRN: [id number]  [facility] [address] [place], [place]  [address]  Dear Ms. Luevano,  Welcome to the Gastroenterology Division at [facility].    You are scheduled to see Dr. Juanda Chance on 06-14-09 at 2:45p.m. on the 3rd floor at [facility], [address].  We ask that you try to arrive at our office 15 minutes prior to your appointment time to allow for check-in.  We would like you to complete the enclosed self-administered evaluation form prior to your visit and bring it with you on the day of your appointment.  We will review it with you.  Also, please bring a complete list of all your medications or, if you prefer, bring the medication bottles and we will list them.  Please bring your insurance card so that we may make a copy of it.  If your insurance requires a referral to see a specialist, please bring your referral form from your primary care physician.  Co-payments are due at the time of your visit and may be paid by cash, check or credit card.     Your office visit will consist of a consult with your physician (includes a physical exam), any laboratory testing he/she may order, scheduling of any necessary diagnostic testing (e.g. x-ray, ultrasound, CT-scan), and scheduling of a procedure (e.g. Endoscopy, Colonoscopy) if required.  Please allow enough time on your schedule to allow for any/all of these possibilities.    If you cannot keep your appointment, please call [phone] to cancel or reschedule prior to your appointment date.  This allows [place] the opportunity to schedule an appointment for another patient in need of care.  If you do not cancel or reschedule by 5 p.m. the business day prior to your appointment date, you will be charged a $50.00 late cancellation/no-show fee.    Thank you for choosing  [facility] for your medical needs.  We appreciate the opportunity to care for you.  Please visit [place] at our website  to learn more about our practice.                     Sincerely,                                                             The Gastroenterology Division

## 2010-05-24 NOTE — Progress Notes (Signed)
Summary: ON CALL - Phone from ER PA  Phone Note From Other Clinic   Caller: PA at [facility] ER Call For: Dr. Lina Sar Details for Reason: Summary of ER visit Summary of Call: Called last night by ER PA for follow up on patient sent to ER by our office. Patient's chief complaints were rectal bleeding and abdominal pain. The work up was negative including labs, X-rays, and stool was heme NEG. Apparrently had a similar negative work up at [facility] ER the day prior.Patient was requesting to be admitted but there was no justifiable admission criteria per ER PA and her Attending MD. I agreed to pass this information along to Dr Juanda Chance and her nurse. Initial call taken by: Hilarie Fredrickson MD,  July 02, 2009 9:07 AM

## 2010-05-24 NOTE — Consult Note (Signed)
Summary: Salicylate Overdose  NAME:  [name], [name]                      ACCOUNT NO.:      MEDICAL RECORD NO.:  [id number]           PATIENT TYPE:      LOCATION:                                 FACILITY:      PHYSICIAN:  Richard F. Caryn Section, M.D.   DATE OF BIRTH:  1951-04-16      DATE OF CONSULTATION:  07/08/2009   DATE OF DISCHARGE:                                    CONSULTATION      [name] is a 60 year old white woman brought to the ER by EMS.  She   was found at home by her son a note was found.  Salicylate level was   obtained 62.6 and Dr. Weldon Inches noted altered mental status.  He asked   for her to be dialyzed secondary to moderate high salicylate level and   altered mental status.      PAST MEDICAL HISTORY:  She is a patient of Dr. Illene Regulus according   to May 04, 2009, discharge summary.      OTHER PAST MEDICAL HISTORY:   1. Fibromyalgia.   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   3. Also childhood paralysis.   4. Hypothyroidism.   5. Mitral valve prolapse.   6. Multiple laparoscopies for endometriosis/PTAH in 1983.   7. Appendectomy, 1970s.   8. Colectomy, 1991, for redundant colon leaving her with chronic       diarrhea.      MEDICATIONS PRIOR TO ADMISSION:  Unsure of doses.  She thinks   amitriptyline, diazepam, gabapentin, methadone, Ultracet, gemfibrozil,   Synthroid, and Zomig      ALLERGIES:  SULFA, PERIDIUM, ASPIRIN, PENICILLIN, TYLENOL, AND   TOPAMAX.  If she takes Ultracet and aspirin, she is probably not   allergic to these.      REVIEW OF SYSTEMS:  Unable to obtain.      FAMILY HISTORY:  Unable to obtain.      SOCIAL HISTORY:  Unable to obtain.      PHYSICAL EXAM:  She is awake, disoriented, talking about cryoglobulins   and my dog, Lottie.  Temperature 98.9.  Pulse 100.  Respirations 26.   Blood pressure 150/100.   EYES:  Pupils equal, round, reactive to light.   NECK:  Not stiff.   CHEST:  Clear.   HEART:  No rub.   ABDOMEN:  Scars from prior  surgery.  Nontender.  Bowel sounds present.   EXTREMITIES:  No rash.  No arthritis.  No atheroembolic changes on toes.   NEURO:  As above and she is restrained.      LAB:  Hemoglobin 12.9, WBC 14,100, PLT as 284 K.  Sodium 138, potassium   3.3, chloride 102, CO2 of 21, BUN 34, CR 1.37, calcium 8.3, salicylate   62.6.  Chest x-ray not available.  PH 7.55, pO2 of 85, pCO2 of 19.      IMPRESSION:   1. Salicylate intoxication.   2. Altered mental status.   3. S/P colectomy with chronic diarrhea.  4. Fibromyalgia.   5. Multiple drug allergies.   6. Hypothyroidism.      PLAN:   1. Hemodialysis tonight, salicylate level in the a.m.  Check PT/INR.   2. Follow CT brain scan if no improvement.   3. Follow.   4. Call Dr. Debby Bud in the morning for correct up-to-date medication       list.   5. Call Dr. Debby Bud.   6. Call Dr. Debby Bud.               Richard F. Caryn Section, M.D.            RFF/MEDQ  D:  07/08/2009  T:  07/09/2009  Job:  [id number]

## 2010-05-24 NOTE — Progress Notes (Signed)
Summary: TRIAGE  Phone Note Call from Patient Call back at Home Phone [phone]   Caller: Patient Call For: Juanda Chance Reason for Call: Talk to Nurse Summary of Call: Patient states that the supposotories are helping but still has severe diarrhea wants to have surgery done Initial call taken by: Tawni Levy,  July 05, 2009 2:34 PM  Follow-up for Phone Call        Pt. was in the ER X2 last week. Began suppositiories for rectal bleeding, the bleeding has stopped, but the severe diarrhea continues. She has watery,urgent stools, 9-12 times daily. She is taking Immodium 2x daily. Restarted Cholestyramine on 07-01-09 two times a day. Advised to restart Benefiber.  DR.Eman Rynders PLEASE ADVISE  Follow-up by: Laureen Ochs LPN,  July 05, 2009 3:06 PM  Additional Follow-up for Phone Call Additional follow up Details #1::        No indication for surgery at this time. Continue Anusol HC supp at bedtime. Qestran 4gms/day, Imodiun three times a day. She needs to lose weight as we agreed on. Additional Follow-up by: Hart Carwin MD,  July 05, 2009 10:45 PM    Additional Follow-up for Phone Call Additional follow up Details #2::    Above MD orders reviewed with patient. Pt. instructed to call back as needed.  Follow-up by: Laureen Ochs LPN,  July 06, 2009 9:10 AM

## 2010-05-24 NOTE — Progress Notes (Signed)
Summary: FYI  Phone Note Call from Patient   Summary of Call: FYI Pt had 4500 dollars in dental work completed to "scrape" the bacteria off her teeth. She has no more nausea. Initial call taken by: Lamar Sprinkles, CMA,  April 01, 2010 2:27 PM

## 2010-05-24 NOTE — Procedures (Signed)
Summary: Colonoscopy  Patient: [name] Note: All result statuses are Final unless otherwise noted.  Tests: (1) Colonoscopy (COL)   COL Colonoscopy           DONE     [facility]     [address] [place], [place]  [address]           COLONOSCOPY PROCEDURE REPORT           PATIENT:  Virginia, [name]  MR#:  [id number]     BIRTHDATE:  [birth year], 58 yrs. old  GENDER:  female           ENDOSCOPIST:  Wilhemina Bonito. Eda Keys, MD     Referred by:  Rosalyn Gess. Norins, M.D.           PROCEDURE DATE:  05/03/2009     PROCEDURE:  Colonoscopy, Diagnostic     ASA CLASS:  Class II     INDICATIONS:  rectal bleeding           MEDICATIONS:   Fentanyl 100 mcg IV, Versed 10 mg IV, Benadryl 50     mg IV           DESCRIPTION OF PROCEDURE:   After the risks benefits and     alternatives of the procedure were thoroughly explained, informed     consent was obtained.  Digital rectal exam was performed and     revealed no abnormalities.   The EC-[id number] ([id number]) endoscope was     introduced through the anus and advanced to the ileum, without     limitations.  The quality of the prep was good, using Colyte.  The     instrument was then slowly withdrawn as the colon was fully     examined.     <<PROCEDUREIMAGES>>           FINDINGS:  There was evidence of a prior subtotal colectomy with     rectum and sigmoid remaining to 25cm.  A normal appearing     remainder of sigmoid colon and rectum appeared unremarkable as     well. The anastomosis was healthy and widely patent. The ileum     appeared normal.   Retroflexed views in the rectum revealed no     abnormalities.    The scope was then withdrawn from the patient     and the procedure completed.           COMPLICATIONS:  None           ENDOSCOPIC IMPRESSION:     1) Prior subtotal colectomy     2) Normal colon     3) Normal  ileum     RECOMMENDATIONS:     1) Return to the care of your primary provider. GI follow up as     needed     2)  EGD today           ______________________________     Wilhemina Bonito. Eda Keys, MD           CC:  Jacques Navy, MD; Lina Sar, MD           n.     [name]:   Wilhemina Bonito. Eda Keys at 05/03/2009 11:13 AM           [place], [id number]  Note: An exclamation mark (!) indicates a result that was not dispersed into the flowsheet. Document Creation Date: 05/03/2009 11:14 AM _______________________________________________________________________  (  1) Order result status: Final Collection or observation date-time: 05/03/2009 11:05 Requested date-time:  Receipt date-time:  Reported date-time:  Referring Physician:   Ordering Physician: Fransico Setters [phone]) Specimen Source:  Source: Launa Grill Order Number: [id number] Lab site:

## 2010-05-24 NOTE — Progress Notes (Signed)
Summary: Niacin  Phone Note Call from Patient Call back at [facility] Phone [phone]   Summary of Call: Patient left message on triage that Niacin is causing large welps and she will be seeing Dr. Juanda Chance today. Patient c/o bleeding and diarrhea and will be worked in with [employer]. Initial call taken by: Lucious Groves,  June 21, 2009 4:28 PM  Follow-up for Phone Call        She can stop the niacin. Other problems deferred to Dr. Juanda Chance Follow-up by: Jacques Navy MD,  June 21, 2009 4:35 PM  Additional Follow-up for Phone Call Additional follow up Details #1::        Patient notified and saw Dr. Juanda Chance on 06/21/2009.  She wanted to know if MD would restart her on the [facility] power that was given once in 03/2009. She states that she has not been constipated in a long time and thinks that this will her w/ cholesterol and loose stool. Additional Follow-up by: Rock Nephew CMA,  June 22, 2009 1:23 PM    Additional Follow-up for Phone Call Additional follow up Details #2::    OK for cholestyramine bulk cannister: 4g (1 scoop) two times a day for diarrhea and cholesterol. Hold benefiber and miralax. Follow-up by: Jacques Navy MD,  June 22, 2009 4:24 PM  Additional Follow-up for Phone Call Additional follow up Details #3:: Details for Additional Follow-up Action Taken: Patient notified and per patient she does need prescription sent to pharmacy at this time. Additional Follow-up by: Lucious Groves,  June 23, 2009 9:55 AM  New/Updated Medications: CHOLESTYRAMINE  POWD (CHOLESTYRAMINE) 1 scoop - 4 g - once daily or two times a day for diarrhea and cholesterol

## 2010-05-24 NOTE — Progress Notes (Signed)
Summary: LABS  Phone Note Call from Patient   Summary of Call: Patient is requesting results of the additional labs.  Initial call taken by: Lamar Sprinkles, CMA,  February 11, 2010 10:47 AM  Follow-up for Phone Call        labs negative for acute or chronic hepatitis B and C Follow-up by: Jacques Navy MD,  February 14, 2010 8:50 AM  Additional Follow-up for Phone Call Additional follow up Details #1::        Pt continues to have vomiting, please advise. Additional Follow-up by: Lamar Sprinkles, CMA,  February 14, 2010 5:10 PM    Additional Follow-up for Phone Call Additional follow up Details #2::    promethazine 12.5 q 6 as needed. [place] [facility].  Follow-up by: Jacques Navy MD,  February 14, 2010 5:23 PM  Additional Follow-up for Phone Call Additional follow up Details #3:: Details for Additional Follow-up Action Taken: Patient informed, she wants to know what the cause of the elevated liver enzymes is? She already has promethazine and takes it on a daily basis. Does pt need f/u labs for recheck of liver enzymes? ..................Marland KitchenLamar Sprinkles, CMA  February 15, 2010 11:39 AM   Liver enzyme elevation is very, very modest and such an elevation can be caused be viral illness, dehydration and many other causes, none serious. Checking for hepatitis was being thorough and compulsive on my part. Marland KitchenMarland KitchenMarland KitchenJacques Navy MD,  February 15, 2010 12:45 PM  NO answer, no vm.....................Marland KitchenLamar Sprinkles, CMA  February 16, 2010 6:25 PM   Pt informed ...............Marland KitchenLamar Sprinkles, CMA  February 17, 2010 4:10 PM   New/Updated Medications: PROMETHAZINE HCL 12.5 MG TABS (PROMETHAZINE HCL) 1 by mouth q6 as needed nausea Prescriptions: PROMETHAZINE HCL 12.5 MG TABS (PROMETHAZINE HCL) 1 by mouth q6 as needed nausea  #30 x 1   Entered and Authorized by:   Jacques Navy MD   Signed by:   Jacques Navy MD on 02/14/2010   Method used:   Electronically to        [facility]*  (retail)       [address]       [place], [place]  [id number]       Ph: [phone]       Fax: [phone]   RxID:   [id number]

## 2010-05-24 NOTE — Progress Notes (Signed)
Summary: severe abdominal pain  Phone Note Call from Patient   Caller: Patient Summary of Call: Had significant acute worsening of abdominal pain and bloody stools. She is waiting for ambulance to take her to the hopsital. I told her I will aait call from ED if I am needed. Initial call taken by: Iva Boop MD, Clementeen Graham,  May 01, 2009 4:11 PM

## 2010-05-24 NOTE — Discharge Summary (Signed)
Summary: Recurrent Abdomial Pain  NAME:  MALYN, AYTES                 ACCOUNT NO.:  [id number]   MEDICAL RECORD NO.:  [id number]          PATIENT TYPE:  INP   LOCATION:  0469                         FACILITY:  [facility]   PHYSICIAN:  Thornton Park. Daphine Deutscher, M.D.DATE OF BIRTH:  [birth year]   DATE OF ADMISSION:  01/03/2004  DATE OF DISCHARGE:  01/19/2004                                 DISCHARGE SUMMARY   ADMISSION DIAGNOSIS:  Recurrent abdominal pain.   PROCEDURES:  1.  Upper endoscopy or colonoscopy negative to the ileorectosigmoid      anastomosis.  2.  Abdominal ultrasound thought possibly to have small stones  3.  HIDA scan, slightly delayed emptying.  4.  Laparotomy.  5.  Enterolysis.  6.  Open cholecystectomy.  7.  Intraoperative cholangiogram which revealed a slightly dilated common      bile duct.   HOSPITAL COURSE:  Ayen Viviano is a 60 year old lady with chronic problems  with defecation and probably pelvic floor dysfunction leading to same who  presented with upper abdominal pain.  After the above mentioned 0workup she  underwent laparotomy and cholecystectomy.  It seemed that her pain was  resolved.  Her path report showed chronic cholecystitis, but no evidence of  stones.  She was ready for discharge on postoperative day #7, at which time  her staples were removed and Steri-strips were applied.  Her wound was  healing nicely.  A prescription for Percocet 7.5/325 (30) were given.   CONDITION ON DISCHARGE:  Stable.   FOLLOW UP:  She was asked to return to the office in three weeks for follow-  up.      MBM/MEDQ  D:  01/19/2004  T:  01/19/2004  Job:  [id number]   cc:   Lina Sar, M.D. [facility]   Rosalyn Gess. Norins, M.D. [facility]
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
## 2010-05-24 NOTE — Procedures (Signed)
Summary: COLON   Colonoscopy  Procedure date:  08/28/2003  Findings:      Location:  [facility].   Patient Name: [name], [name] MRN: [id number] Procedure Procedures: Colonoscopy CPT: 86578.    with biopsy. CPT: Q5068410.  Personnel: Endoscopist: Dora L. Juanda Chance, MD.  Exam Location: Exam performed in Endoscopy Suite.  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  Symptoms: Abdominal pain / bloating. Change in bowel habits.  Surveillance of: 1998.  History  Current Medications: Patient is not currently taking Coumadin.  Pre-Exam Physical: Performed Aug 28, 2003. Cardio-pulmonary exam, Rectal exam, HEENT exam , Abdominal exam, Extremity exam, Neurological exam, Mental status exam WNL.  Exam Exam: Extent of exam reached: Hepatic Flexure, extent intended: Cecum.  Incomplete due to inability to intubate. Colon retroflexion performed. Images taken. ASA Classification: I. Tolerance: good.  Monitoring: Pulse and BP monitoring, Oximetry used. Supplemental O2 given.  Colon Prep Used Miralax for colon prep. Prep results: good.  Fluoroscopy: Fluoroscopy was not used.  Sedation Meds: Fentanyl 125 mcg. given IV. Versed 13 given IV.  Findings - OTHER FINDING: dilated, atonic tortuous colon found in Ascending Colon. Biopsy/Other Finding taken. Comments: it is not clear the extent of the previous colon resections, biopsies of the mucosa taken from 80cm to see if Large or small intestine.  - PRIOR SURGERY: Sigmoid Colon. Segmental Colectomy. Anastamosis present. Comments: wide open rectosigmoid anastomosis.    Comments: unable to advence beyond about 80 cm due to extreme coiting and pressure necessary to advance the scope, I suspect her colon is displaced in  the pelvis Assessment Abnormal examination, see findings above.    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  Unplanned Events: There were no complications. Plans Medication Plan: Await pathology. Zelnorm: 6 QD, starting Aug 28, 2003   Patient Education: Patient given standard instructions for: Yearly hemoccult testing recommended.  Comments: follow up in the office, may need BE to visualize the full extent of her colon Disposition: After procedure patient sent to recovery.   This report was created from the original endoscopy report, which was reviewed and signed by the above listed endoscopist.    FINAL DIAGNOSIS    ***MICROSCOPIC EXAMINATION AND DIAGNOSIS***    COLON, 80 CM, BIOPSY: SMALL INTESTINAL-TYPE MUCOSA WITH NO   EVIDENCE OF ACTIVE MUCOSAL INFLAMMATION, DYSPLASIA OR MALIGNANCY.     kv   Date Reported: 08/31/2003 Alden Server A. Delila Spence, MD   *** Electronically Signed Out By EAA ***    Clinical information   History of 2 colon resections, not sure how much colon resected.   Colon vs. small bowel? No previous biopsy. (mw)    specimen(s) obtained   Colon, biopsy, @ 80cm    Gross Description   Received in formalin are tan, soft tissue fragments that are   submitted in toto. Number: multiple   Size: each 0.3cm (GP:gt) 08/31/03    gdt/

## 2010-05-24 NOTE — Progress Notes (Signed)
Summary: TRIAGE  Phone Note Call from Patient Call back at [phone]   Caller: Son Burley Saver. Christopher Call For: Juanda Chance Summary of Call: Son wants to know what is going on with his mother wants to speak to Dr Juanda Chance "if possible" Initial call taken by: Tawni Levy,  July 08, 2009 10:37 AM  Follow-up for Phone Call        DR.Sade Hollon PLEASE ADVISE  Follow-up by: Laureen Ochs LPN,  July 08, 2009 10:50 AM  Additional Follow-up for Phone Call Additional follow up Details #1::        spoke with son Cristal Deer at length, he completely understands the problem his mother has incuding her drug seeking behavior. She needs to follow our recommendations as stated before. Additional Follow-up by: Hart Carwin MD,  July 08, 2009 1:27 PM    Additional Follow-up for Phone Call Additional follow up Details #2::    Per Dr Regino Schultze request, I have sent son, Cristal Deer and Ms. Willow a high fiber diet (They both understand Ms Cahoon should use only foods low in fiber content). Hortense Ramal CMA Duncan Dull)  July 08, 2009 1:29 PM

## 2010-05-24 NOTE — Procedures (Signed)
Summary: COLON   Colonoscopy  Procedure date:  01/04/2004  Findings:      Location:  [facility].   Patient Name: [name], [name] MRN: [id number] Procedure Procedures: Colonoscopy CPT: 40981.  Personnel: Endoscopist: Barbette Hair. Arlyce Dice, MD.  Indications Symptoms: Hematochezia.  Current Medications: Patient is not currently taking Coumadin.  Pre-Exam Physical: Performed Jan 04, 2004. Entire physical exam was normal. Cardio- pulmonary exam, Rectal exam, HEENT exam , Abdominal exam, Extremity exam, Neurological exam, Mental status exam WNL.  Exam Exam: Extent of exam reached: Terminal Ileum, extent intended: Terminal Ileum.  The cecum was identified by IC valve. Colon retroflexion performed. ASA Classification: II. Tolerance: good.  Monitoring: Pulse and BP monitoring, Oximetry used. Supplemental O2 given. at 2 Liters.  Colon Prep Used Miralax for colon prep. Prep results: excellent.  Sedation Meds: Fentanyl 175 mcg. given IV. Versed 16 given IV. Promethazine 25 mg given IV.  Findings - NORMAL EXAM: Splenic Flexure to Sigmoid Colon. Comments: Bowel appearance most c/w small bowel.  Scope was passed 100cm.  No blood seen.  PRIOR SURGERY: Sigmoid Colon. Segmental Colectomy.   Assessment Normal examination.  Events  Unplanned Interventions: No intervention was required.  Unplanned Events: There were no complications. Plans Patient Education: Patient given standard instructions for: a normal exam.   This report was created from the original endoscopy report, which was reviewed and signed by the above listed endoscopist.

## 2010-05-24 NOTE — Progress Notes (Signed)
Summary: triage  Phone Note Call from Patient Call back at Home Phone [phone]   Caller: Patient Call For: Juanda Chance Reason for Call: Talk to Nurse Summary of Call: Patient has severe diarrhea and rectal bleeding x 2 weeks wants to be seen asap Initial call taken by: Tawni Levy,  June 21, 2009 8:23 AM  Follow-up for Phone Call        Pt. will see Dr.Jasdeep Dejarnett today at 3:15pm. Follow-up by: Laureen Ochs LPN,  June 21, 2009 9:10 AM

## 2010-05-24 NOTE — Progress Notes (Signed)
Summary: Triage  Phone Note Call from Patient   Caller: mother Dossie Arbour  [phone] Call For: Dr. Juanda Chance Reason for Call: Talk to Nurse Summary of Call: Pt.'s mother she said Dr. Juanda Chance recommended 2 weeks ago a colostomy and to lose weight. She was told today to f/u w/ her PCP and "not to call this office back". She is upset and said she would like to discuss her daughter's condition. Her daughter is a "very sick patient" Initial call taken by: Karna Christmas,  July 06, 2009 3:09 PM  Follow-up for Phone Call        Pt. was absolutely NOT told not to call back. She said that to me and I clarified to her that that is NOT what I said. She was advised to follow Dr.Brodies instructions and call back as needed and to follow with her PCP for other concerns.   DR.Tvisha Schwoerer PLEASE ADVISE  Follow-up by: Laureen Ochs LPN,  July 06, 2009 3:16 PM  Additional Follow-up for Phone Call Additional follow up Details #1::        Pt is 60 years old, I don't need to talk to her mother. I will see her every 3 months  for GI update. No surgery recommended at this time. Her PCP may  follow  the day to day problems. Additional Follow-up by: Hart Carwin MD,  July 06, 2009 10:04 PM    Additional Follow-up for Phone Call Additional follow up Details #2::    Above MD orders reviewed with patient. Pt. instructed to call back as needed.  Follow-up by: Laureen Ochs LPN,  July 07, 2009 8:11 AM

## 2010-05-24 NOTE — Progress Notes (Signed)
Summary: TRIAGE  Phone Note Call from Patient Call back at Home Phone [phone]   Call For: Dr Juanda Chance Summary of Call:  3days ago started bleeding rectally, is concerned. Initial call taken by: Leanor Kail [facility],  May 31, 2009 3:15 PM  Follow-up for Phone Call        Pt. saw more blood in her stool last night, although she is still using the Anusol Supp. for bleeding last week. Pt. is very concernedand would like to be seen ASAP. She will see Mike Gip [facility] on 06-01-09 at 10am. If symptoms become worse call back immediately or go to ER.  Follow-up by: Laureen Ochs LPN,  May 31, 2009 3:40 PM

## 2010-05-24 NOTE — Progress Notes (Signed)
Summary: TRIAGE--SEVERE PAIN  Phone Note Call from Patient Call back at Home Phone [phone]   Caller: Patient Call For: Dr. Juanda Chance Reason for Call: Talk to Nurse Summary of Call: pt reporting that during the last two weeks her back is hurting, particularly on the left side and while having a BM and after... pt says pain is very intense and she has to lay down to get through it. Initial call taken by: Vallarie Mare,  June 30, 2009 1:51 PM  Follow-up for Phone Call        Last OV with Dr.Graham Hyun was on 06-21-09. Since then she c/o continued diarrhea, despite following measures Dr.Jonessa Triplett recommended. Pt. now has severe back/waist pain after every BM. She hasn't been able to walk her dog in 5 days. She has vomited today and states the left side pain is the worst, she states it swells when she eats. She states the pain is so severe she feels she needs to go to the ER. "In the last hour I feel like something is stuck and stabbing me, the pain is so bad it made me throw-up. It is HORRIBLE!" Pt. sounds very uncomfortable, pt. advised to go to the ER for eval. ASAP.  Pt. states, "I will try to wait for awhile, I need to get somethings together and contact some people"  DR.Jenevie Casstevens PLEASE REVIEW AND ADVISE  Follow-up by: Laureen Ochs LPN,  June 30, 2009 2:22 PM  Additional Follow-up for Phone Call Additional follow up Details #1::        OK to go to ER. Anther suggestion is to take Phenergan 12.5 mg q 6 hrs as needed nausea, it will calm her stomach too. Additional Follow-up by: Hart Carwin MD,  June 30, 2009 3:35 PM    Additional Follow-up for Phone Call Additional follow up Details #2::    Per pt. neighbor, pt. has gone to [facility] ER. Follow-up by: Laureen Ochs LPN,  June 30, 2009 3:42 PM

## 2010-05-24 NOTE — Progress Notes (Signed)
Summary: results request  Phone Note Call from Patient Call back at Home Phone [phone]   Caller: Patient Call For: Dr. Juanda Chance Reason for Call: Lab or Test Results Summary of Call: would like someone to go over EGD results  Initial call taken by: Vallarie Mare,  December 01, 2009 10:33 AM  Follow-up for Phone Call        talked with pt.  She has appt with Dr. Arthur Holms tomorrow re several issues.  Pt had wanted to make sure Dr. Arthur Holms has results of EGD that was done earlier this year.  Pt informed that Dr. Arthur Holms shares EMR with Gi and he can see all GI reports. Follow-up by: Ashok Cordia RN,  December 01, 2009 11:44 AM

## 2010-05-24 NOTE — Progress Notes (Signed)
Summary: URGENT TRIAGE: Rectal bleeding  Phone Note Call from Patient Call back at Home Phone [phone]   Call For: Dr Juanda Chance Summary of Call: Micah Flesher to [facility] last night is still in alot of pain. Is doubled over with the pain. Was there 7hrs and only got of attention. There was a girl in there that was constantly pacing and screaming that she was going to throw up and needed a thrash can. Running around the room.  She said she had just left Dr Regino Schultze office. When she left the room because she couldnt take the girl anymore she was discharged and sent home. They didnt even take labs or anything.  Started to have severe bleeding gushing out  that took 4 bath towels soaked up. It was like a faucette that was turned on. Was diarrhea and blood and she could not stop it. Only has 6inches of Colon so she is extremely scared. Doesnt want to go back to Emergency Room. Feels like there is a knife in the right side of her back. Her stomach is swollen on the left side.  Initial call taken by: Leanor Kail [facility],  July 01, 2009 1:02 PM  Follow-up for Phone Call        Pt. states she had severe rectal bleeding at 10pm last night, continues bleeding at this time, though not as severe. She continues with same severe abd. pain she was having yesterday when she was instructed to go to the ER.   1) Call 911 or have someone take you to the ER ASAP  Follow-up by: Laureen Ochs LPN,  July 01, 2009 1:17 PM     Appended Document: URGENT TRIAGE: Rectal bleeding she just had a colonoscopy by Dr Marina Goodell. Please start and continue Anusol HC supp two times a day, 2 refills.  Appended Document: med Update Above MD orders reviewed with patient.Pt. instructed to call back as needed.    Clinical Lists Changes  Medications: Changed medication from ANUSOL-HC 25 MG SUPP (HYDROCORTISONE ACETATE) Use 1 suppository at bedtime to ANUSOL-HC 25 MG SUPP (HYDROCORTISONE ACETATE) Put 1 in rectum 2 times daily.  - Signed Rx of ANUSOL-HC 25 MG SUPP (HYDROCORTISONE ACETATE) Put 1 in rectum 2 times daily.;  #60 x 2;  Signed;  Entered by: Laureen Ochs LPN;  Authorized by: Hart Carwin MD;  Method used: Electronically to [facility] [address], [place], [place]  [id number], Ph: [phone], Fax: [phone]    Prescriptions: ANUSOL-HC 25 MG SUPP (HYDROCORTISONE ACETATE) Put 1 in rectum 2 times daily.  #60 x 2   Entered by:   Laureen Ochs LPN   Authorized by:   Hart Carwin MD   Signed by:   Laureen Ochs LPN on 24/40/1027   Method used:   Electronically to        [facility]* (retail)       [address]       [place], [place]  [id number]       Ph: [phone]       Fax: [phone]   RxID:   [id number]

## 2010-05-24 NOTE — Consult Note (Signed)
Summary: [facility]  [facility]   Imported By: Lester Antelope 08/12/2009 08:29:18  _____________________________________________________________________  External Attachment:    Type:   Image     Comment:   External Document

## 2010-05-24 NOTE — Op Note (Signed)
Summary: Exploratory Laparotomy/Cholecystectomy   NAME:  [name], [name]                 ACCOUNT NO.:  [id number]   MEDICAL RECORD NO.:  [id number]          PATIENT TYPE:  INP   LOCATION:  0469                         FACILITY:  [facility]   PHYSICIAN:  Thornton Park. Daphine Deutscher, M.D.DATE OF BIRTH:  12-12-1950   DATE OF PROCEDURE:  01/12/2004  DATE OF DISCHARGE:                                 OPERATIVE REPORT   PREOPERATIVE DIAGNOSIS:  Chronic upper abdominal pain.   POSTOPERATIVE DIAGNOSIS:  Dilated common bile duct with questionable distal  common bile duct stricture, multiple small bowel adhesions from her ileal  rectosigmoid anastomosis proximal to the ligament of Treitz.   PROCEDURE:  1.  Exploratory laparotomy.  2.  Complete enterolysis.  3.  Open cholecystectomy.  4.  Intraoperative cholangiogram.   SURGEON:  Luretha Murphy, MD   ASSISTANT:  Marcy Panning, MD   ANESTHESIA:  General endotracheal.   DRAINS:  None.   DESCRIPTION OF PROCEDURE:  A 59 year old patient brought to OR 10, given  general anesthesia.  The abdomen was prepped with Betadine and draped  sterilely.  An upper midline incision was used and carried down below the  umbilicus.  The abdomen was entered without difficulty.  She had numerous  loops of small bowel hung and stuck to the anterior incision.  These were  taken down with sharp dissection as well as with the Bovie.  Once this was  free, I began by running her small bowel, eventually from the ligament of  Treitz all the way distally and lysing several loops of bowel that were  stuck both to the right adnexa as well as the left adnexa in the pelvis as  well as to themselves.  Once this was complete, we then turned our attention  to the gallbladder.  In my previous operative note, I had described a  phrygian cap.  Gallbladder was a nice, robin's egg blue color, but it looked  like she had a very large common bile duct.  The gallbladder was grasped,  and I dissected  free the cystic duct, incised this, and put in a Cook  catheter and took a dynamic cholangiogram which showed filling of a very  large common bile duct with intrahepatic filling and flow into the duodenum  but with a possible stricture distally or certainly hypertonic sphincter  apparatus.  The cystic duct was tied off with a 2-0 silk tie, and the  gallbladder was removed without difficulty.  The gallbladder bed was without  bleeding or bile leaks at the end of this phase of the operation.  The  sponge and needle counts were reported as correct.  An NG tube was placed  into the stomach, was in good position.  The fascia was  then closed with #1 PDS from above and below, tied in the middle.  The wound  was irrigated.  I also injected the fascia with some Marcaine 0.5% plain.  The skin was approximated with staples.  The patient was taken to the  recovery room in satisfactory condition.      MBM/MEDQ  D:  01/12/2004  T:  01/12/2004  Job:  [id number]   cc:   Iva Boop, M.D. [facility]

## 2010-05-24 NOTE — Progress Notes (Signed)
Summary: Vomiting  Phone Note Call from Patient   Summary of Call: Pt has apt for this pm - she called to cancel due to vomiting, does not feel she can make it into the office. Pt will try the phenergan suppositories she has on hand and call office back later w/update.  Initial call taken by: Lamar Sprinkles, CMA,  February 03, 2010 10:51 AM  Follow-up for Phone Call        Pt called back. She still has some vomiting and rescheduled office visit eval to tomorrow.  Follow-up by: Lamar Sprinkles, CMA,  February 03, 2010 3:24 PM

## 2010-05-24 NOTE — Progress Notes (Signed)
Summary: TRIAGE  Phone Note Call from Patient Call back at Home Phone [phone]   Caller: Patient Call For: Juanda Chance Reason for Call: Talk to Nurse Summary of Call: Patient states the she has severe abd pain and has no control of her bowels, wants to [facility] what to do. Initial call taken by: Tawni Levy,  May 10, 2009 1:46 PM  Follow-up for Phone Call        Pt. was discharged from the [facility]  05-03-09, had a Colon and Endo. while there.  Pt. continues with urgent,watery, uncontrollable stools. Some black in stool this morning.  Lower back pain, pain in entire abd. and severe bloating. Denies blood, fever, vomiting.   1) See Amy Esterwood PAC on 05-11-09 at 9:30am 2) Full liquid diet 3) Immodium as needed for diarrhea. 4) If symptoms become worse call back immediately or go to ER.   DR.Ivah Girardot (DOD) PLEASE REVIEW.  Follow-up by: Laureen Ochs LPN,  May 10, 2009 2:40 PM

## 2010-05-24 NOTE — Procedures (Signed)
Summary: Upper Endoscopy  Patient: [name] Note: All result statuses are Final unless otherwise noted.  Tests: (1) Upper Endoscopy (EGD)   EGD Upper Endoscopy       DONE     [facility]     [address] [place], [place]  [address]           ENDOSCOPY PROCEDURE REPORT           PATIENT:  [name], [name]  MR#:  [id number]     BIRTHDATE:  31-Aug-1950, 58 yrs. old  GENDER:  female           ENDOSCOPIST:  Wilhemina Bonito. Eda Keys, MD     Referred by:  Rosalyn Gess. Norins, M.D.           PROCEDURE DATE:  05/03/2009     PROCEDURE:  EGD, diagnostic     ASA CLASS:  Class II     INDICATIONS:  nausea and vomiting, GERD           MEDICATIONS:   There was residual sedation effect present from     prior procedure., Fentanyl 50 mcg IV, Versed 5 mg IV     TOPICAL ANESTHETIC:  Cetacaine Spray           DESCRIPTION OF PROCEDURE:   After the risks benefits and     alternatives of the procedure were thoroughly explained, informed     consent was obtained.  The EG-[id number] ([id number]) endoscope was     introduced through the mouth and advanced to the third portion of     the duodenum, without limitations.  The instrument was slowly     withdrawn as the mucosa was fully examined.     <<PROCEDUREIMAGES>>           The upper, middle, and distal third of the esophagus were     carefully inspected and no abnormalities were noted. The z-line     was well seen at the GEJ. The endoscope was pushed into the fundus     which was normal including a retroflexed view. The antrum,gastric     body, first and second part of the duodenum were unremarkable.     Retroflexed views revealed no abnormalities.    The scope was then     withdrawn from the patient and the procedure completed.           COMPLICATIONS:  None           ENDOSCOPIC IMPRESSION:     1) Normal EGD     2) Gerd     3) Suspect N,V, due to GERD and likely suboptimal gastric emptying     due to chronic narcotics     RECOMMENDATIONS:     1)  Protonix bid     2) Advance diet as tolerated           _____________________________     Wilhemina Bonito. Eda Keys, MD           CC:  Jacques Navy, MD, Lina Sar, MD           n.     [name]:   Wilhemina Bonito. Eda Keys at 05/03/2009 11:29 AM           [place], [phone]  Note: An exclamation mark (!) indicates a result that was not dispersed into the flowsheet. Document Creation Date: 05/03/2009 11:30 AM _______________________________________________________________________  (1) Order result status: Final Collection or  observation date-time: 05/03/2009 11:23 Requested date-time:  Receipt date-time:  Reported date-time:  Referring Physician:   Ordering Physician: Fransico Setters [phone]) Specimen Source:  Source: Launa Grill Order Number: [id number] Lab site:

## 2010-05-24 NOTE — Progress Notes (Signed)
Summary: OV TOMORROW  Phone Note Call from Patient   Summary of Call: Pt left vm c/o knot on leg x 3 days, throat tightness, IBS-diarrhea flare, difficulty holding objects, numbess in hand & feet. She is not able to come in today due to diarrhea. Patient will contact office or go to ER if symptoms become severe. Otherwise, she is scheduled for tomorrow at 9 am for 30 min office visit for eval.  Initial call taken by: Lamar Sprinkles, CMA,  December 01, 2009 10:26 AM  Follow-up for Phone Call        k Follow-up by: Jacques Navy MD,  December 01, 2009 12:55 PM

## 2010-05-24 NOTE — Op Note (Signed)
Summary: Subtotal Colectomy                    [place]  Patient:    [name], WIEDERHOLT                        MRN: [id number] Proc. Date: 01/23/00 Adm. Date:  [id number] Attending:  Katha Cabal CC:         Hedwig Morton. Juanda Chance, M.D. [facility]   Operative Report  PREOPERATIVE DIAGNOSES: 1. Chronic obstipation. 2. Fibromyalgia with recurrent migraine headaches.  POSTOPERATIVE DIAGNOSES: 1. Chronic obstipation. 2. Fibromyalgia and recurrent migraine headaches.  OPERATION:  Subtotal colectomy with ileosigmoid anastomosis.  SURGEON:  Thornton Park. Daphine Deutscher, M.D.  ASSISTANT:  Zigmund Daniel, M.D.  ANESTHESIA:  General endotracheal with postoperative epidural.  DESCRIPTION OF PROCEDURE:   Latondra Gebhart is a 60 year old lady who after informed consent had been taken regarding colectomy for this problem, was brought to OR 9 on January 23, 2000 and after general anesthesia was administered, the abdomen was prepped with Betadine and draped sterilely.  I excised her old scar.  I went down the midline and incised the fascia and opened and entered the abdomen without difficulty.  She had some filmy adhesions about the old midline incision where she had had her previous partial colectomy for chronic constipation. I entered the abdomen and then found her previous anastomosis with the mid sigmoid and I had previously mobilized her splenic flexure and found fairly redundant twisted transverse colon and I did have to mobilize her right colon.  I identified her cecum which was somewhat floppy, mobilized it out of the pelvis and mobilized the right colon using Bovie.  This was brought up into the wound and I then divided it proximally in the terminal ileum with a GIA and distally just distal to the previous anastomosis.  I then went through the mesentery with a combination of Kelly clamps with 2-0 silk ties as well as the harmonic scalpel and divided the mesentery all the way  around. I inspected the mesentery and there was no bleeding seen.  I then fashioned functional end to end anastomosis using the GIA stapling the two antimesenteric borders of the terminal ileum to the sigmoid colon.  The common defect was closed with a single application of the TA 55.  The anastomosis looked good and the colon appeared healthy and viable.  Mesenteric defect was closed with several interrupted 4-0 silks and then the small bowel was traced backward from the terminal ileum.  Prior to doing this; however, I did place a crotch stitch down distally so that the anastomosis would not get kinked.  I then ran the small bowel back to the ligament of Treitz. She had some very thin mesentery and the small bowel caliber looked unremarkable.  No bleeding was seen.  I irrigated with saline. I changed my gloves.  I then closed the lower portion of the wound with running 2-0 Vicryl and then closed the fascia with a running #1 PDS.  The area was irrigated.  The skin was approximated with vertical mattress sutures of 3-0 nylon and staples.  The patient seemed to tolerate the procedure well and was taken to the recovery room in satisfactory condition. An epidural was placed.  Because of her chronic pain problems anesthesia consult was obtained and we decided to put in an epidural and to start her on oral OxyContin postoperatively. DD:  01/23/00  TD:  01/24/00 Job: [id number]
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
## 2010-05-24 NOTE — Progress Notes (Signed)
Summary: Follow up from ER X2  Phone Note Call from Patient Call back at Home Phone [phone]   Caller: Patient Summary of Call: patient states that since she left the ER last night she is stable and was told to call today to find out what the next step is for her to get to see someone in surgery.  Initial call taken by: Harlow Mares CMA Duncan Dull),  July 02, 2009 10:10 AM  Follow-up for Phone Call        pt. states she is feeling better today. Dr.Marquasia Schmieder please further advise.  Follow-up by: Laureen Ochs LPN,  July 02, 2009 10:55 AM  Additional Follow-up for Phone Call Additional follow up Details #1::        There is nothing different  that needs to be done.I would not recommend surgery at this time although we talked about it as a last resort. Please follow the same recommendations given to her during her last visit. on 06/21/2009 Additional Follow-up by: Hart Carwin MD,  July 02, 2009 12:22 PM    Additional Follow-up for Phone Call Additional follow up Details #2::    Above MD orders reviewed with patient. Pt. instructed to call back as needed.  Follow-up by: Laureen Ochs LPN,  July 02, 2009 12:32 PM
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
## 2010-05-24 NOTE — Progress Notes (Signed)
Summary: rectal bleeding  Phone Note Call from Patient Call back at Home Phone [phone]   Caller: Patient Call For: Mike Gip Reason for Call: Talk to Nurse Summary of Call: pt reports severe bleeding from her rectum again... started about 4:15pm this afternoon... pt very worried and would like to know what to do Initial call taken by: Vallarie Mare,  May 12, 2009 4:45 PM    New/Updated Medications: HYDROCORTISONE ACETATE 25 MG SUPP (HYDROCORTISONE ACETATE) Use 1suppository 1-2 times daily,  use one at bedtime Prescriptions: HYDROCORTISONE ACETATE 25 MG SUPP (HYDROCORTISONE ACETATE) Use 1suppository 1-2 times daily,  use one at bedtime  #10 x 0   Entered by:   Lowry Ram NCMA   Authorized by:   Sammuel Cooper PA-c   Signed by:   Lowry Ram NCMA on 05/12/2009   Method used:   Electronically to        [facility]* (retail)       [address]       [place], [place]  [id number]       Ph: [phone]       Fax: [phone]   RxID:   [id number]

## 2010-05-24 NOTE — Progress Notes (Signed)
Summary: appt  Phone Note Call from Patient Call back at Home Phone [phone]   Caller: Patient Summary of Call: Patient called [facility] needing to check staus of neurology referral. Please advise Initial call taken by: Rock Nephew CMA,  December 06, 2009 1:50 PM  Follow-up for Phone Call        Patient is requesting status of referral, she is willing to see Dr Sandria Manly or Thad Ranger. Please inform patient.  Follow-up by: Lamar Sprinkles, CMA,  December 07, 2009 2:41 PM  Additional Follow-up for Phone Call Additional follow up Details #1::        called pt to informed that the dr has to review the records and they will contact our office once appt has been made Shelbie Proctor  December 09, 2009 8:14 AM

## 2010-05-24 NOTE — Progress Notes (Signed)
Summary: TRIAGE  Phone Note Call from Patient Call back at Home Phone [phone]   Caller: Patient Call For: Mike Gip Reason for Call: Talk to Nurse Summary of Call: Pt is still have rectal bleeding. Is in alot of pain Initial call taken by: Karna Christmas,  May 13, 2009 11:46 AM  Follow-up for Phone Call        Patient called back and states that her whole body was covered with what she states looked like a bad sunburn she said that she calmed it down with wet towels and ice but she's very concern, wants to speak to a nurse asap she also states that she cant move much because she has rectal bleeding. Follow-up by: [name],  May 13, 2009 12:50 PM  Additional Follow-up for Phone Call Additional follow up Details #1::        Spoke to pt and is aware she has a tear in her colon and her bleeding is small amt now and then during the day. It is worse if she moves around.  The bleeding is no worse today . She now started today her face and torso turned bright red and she couldn't stand to have clothes on , very painful.  She has a few spots of a rash on her face.  I asked if she ever had shingles and she said she has had them several times before.  She has been putting cold towels on her face and torso.    Additional Follow-up by: [name],  May 13, 2009 2:29 PM    Additional Follow-up for Phone Call Additional follow up Details #2::    Per Netta Fodge, tell pt to keep on top of how much she is bleeding. [name] is not worried since it hasn't increased since yesterday.  As far as the skin redness on face, neck arms, torso, Alyn Riedinger thinks she should call Dr. Debby Bud office and let them know.  They may want to see her.  I told her I would call her tomorrow the 21st and check on her GI status. Follow-up by: [name],  May 13, 2009 2:41 PM  Additional Follow-up for Phone Call Additional follow up Details #3:: Details for Additional Follow-up Action Taken: LM for  pt to call if she is concerned and has contiuned bleeding or it has increased.
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
## 2010-05-24 NOTE — Procedures (Signed)
Summary: COLON   Colonoscopy  Procedure date:  11/20/1999  Findings:                               [facility]  Patient:    [name], [name]                        MRN: [id number] Adm. Date:  [id number] Disc. Date: [id number] Attending:  Mervin Hack CC:         [place]. Daphine Deutscher, M.D.             Rosalyn Gess Norins, M.D. [facility]             [facility] [employer] [place], [place]             QVZ #[phone]                           Procedure Report  PROCEDURE:  Colonoscopy.  INDICATIONS FOR PROCEDURE:  This 60 year old white female has had chronic obstipation and pseudo obstruction initially evaluated approximately 12 years ago.  She has required segmental colon resection for hypomotility of her colon and did okay for a while, but then never really resumed normal bowel habits. She has been diagnosed with fibromyalgia and has been on OxyContin for ______ generalized pain.  She has been on multiple laxatives, but has not been able to evacuate stool to the point that she gets severely distended.  She also has nausea and vomiting as a result of her constipation.  She was evaluated in the office on November 04, 1999 and confirmed abdominal distention with possible pseudo obstruction status.  She has been on Miralax 17 g b.i.d. with only poor results.  She is now undergoing colonoscopy to assess the generalized colonic anatomy.  ENDOSCOPE:  Olympus single channel video endoscope.  SEDATION:  Versed 12 mg IV, fentanyl 125 mcg IV.  FINDINGS:  The Olympus single channel videoscope was passed under direct vision from the rectum through the sigmoid colon.  The patient was monitored by pulse oximeter.  Her oxygen saturations were normal.  The patient remained alert through the procedure despite a large amount of pain medications.  The anal canal and rectal ampulla were normal.  There was no evidence of dilatation in the rectum.  The entire  colon was rather mildly dilated.  There was no evidence of spasm or diverticulosis.  There was retained stool throughout the entire colon despite the standard laxative regimen which included one gallon of GoLYTELY and one Fleets Phospho-soda 3 oz solution. The mucosa of the colon appeared normal.  There was no acute inflammation. There was only mild melanosis coli throughout the colon.  The hepatic flexure, transverse colon and right colon mucosa were normal, but the lumen was somewhat dilated, indicating chronic laxative use.  The cecal pouch was also large with a normal appearing ileocecal valve.  The colonoscope was then retracted and the colon decompressed.  The patient tolerated the procedure well.  IMPRESSION: 1. 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 2. Mild melanosis coli. 3. Status post remote segmental resection of the colon with no visible    anastomotic site.  PLAN: 1. Small bowel follow through to assess the motility of the small bowel.  2. Increase Miralax to 17 g t.i.d. 3. Discuss decreasing her OxyContin with Dr. Judeth Cornfield in the pain clinic. 4. The patient has an appointment with Dr. Luretha Murphy to assess for    additional colon resection. DD:  11/18/99 TD:  11/20/99 Job: [id number]

## 2010-05-24 NOTE — Assessment & Plan Note (Signed)
Summary: VOMITING--[facility]  [facility]   Vital Signs:  Patient profile:   60 year old female Height:      72 inches Weight:      154 pounds BMI:     20.96 O2 Sat:      97 % on Room air Temp:     98.6 degrees F oral Pulse rate:   104 / minute BP supine:   132 / 78  (right arm) BP sitting:   120 / 80  (right arm) BP standing:   124 / 70  (right arm)  Vitals Entered By: Bill Salinas CMA (February 04, 2010 10:32 AM)  O2 Flow:  Room air CC: pt here with 3 day history of nausea, vomitting and diarrhea/ ab   Primary Care Provider:  Micheal Norins,MD  CC:  pt here with 3 day history of nausea and vomitting and diarrhea/ ab.  History of Present Illness: Patients for a history of N/V with report of blood after several episodes of emesis. The blood was bright red. she had concurrent diarrhea without hematochezia.l She had no fever.  No new foods or new restaurants.  She has chronic Nausea and IBS along with chronic pain syndrome from fibromyalgia, back pain. She has been able to eat and drink.  She reports that she has had "bright yellow" urine and brought stained under-garments to substantiate her complaint. She has had no hematuria, no dysuria, no fever, no lower abdominal pain other than her chronic discomfort.   Current Medications (verified): 1)  Dolophine 10 Mg  Tabs (Methadone Hcl) .... 3 Tabs By Mouth Four Times A Day. Fill On or After 01/06/2010 2)  Diazepam 2 Mg Tabs (Diazepam) .Marland Kitchen.. 1 By Mouth Three Times A Day For Muscle Spasm; 2 At Bedtime For Sleep As Needed. 3)  Zomig 5 Mg Tabs (Zolmitriptan) .... As Needed 4)  Levothyroxine Sodium 50 Mcg  Tabs (Levothyroxine Sodium) .... Once Daily 5)  Centurm .... Take 1 Tablet By Mouth Once A Day 6)  Ca+d .... Take 1 Tablet By Mouth Once A Day 7)  Prevnara .... Take 1 Tablet By Mouth Once A Day 8)  Pepcid Ac 10 Mg Tabs (Famotidine) .... Take 1 Tab Twice Daily 9)  Benefiber  Powd (Wheat Dextrin) .... Take 1 Tablespoon By Mouth Dissolved in Water  Once Daily 10)  Align  Caps (Probiotic Product) .... Take 1 Capsule By Mouth Once A Day 11)  Miralax  Powd (Polyethylene Glycol 3350) .... Take 1 Scoop (17 Grams) Dissolved in Water/juice Once Daily 12)  Anusol-Hc 25 Mg Supp (Hydrocortisone Acetate) .... Put 1 in Rectum 2 Times Daily. 13)  Cholestyramine  Powd (Cholestyramine) .Marland Kitchen.. 1 Scoop - 4 G - Once Daily or Two Times A Day For Diarrhea and Cholesterol 14)  Premarin 0.3 Mg Tabs (Estrogens Conjugated) .Marland Kitchen.. 1 By Mouth Once Daily 15)  Amitriptyline Hcl 50 Mg Tabs (Amitriptyline Hcl) .... At Bedtime  Allergies (verified): 1)  ! Sulfa 2)  ! Depakote 3)  ! * Topramax 4)  ! Pyridium 5)  ! Hydrocodone 6)  ! Fluvirin (Influenza Vac Typ A&b Surf Ant) 7)  ! Tylenol 8)  Hydrocodone-Homatropine (Hydrocodone-Homatropine)  Past History:  Past Medical History: Last updated: 07/27/2009 fibromalygia IBS chronic pain-lumbar disk disease migranes Depression childhood paralysisthyrosic hnp h.zoster x 4 Skin cancer, hx of Hyperlipidemia Thrown from horse 1987-back injury ASA overdose requiring HD x 1 (March '11)   physician Roster:       GI- D. Juanda Chance  Gyn- Eyvonne Mechanic       GS- Sheron Nightingale       Psychiatrist - Janace Hoard  Past Surgical History: Last updated: 05/11/2009 SUB TOTALCOLECTOMY x2 for redundant colon-incidental cholEcystecomy laproscopic surgery or endometriosis x 5 Appendectomy Hysterectomy Oophorectomy Tonsillectomy PSH reviewed for relevance, FH reviewed for relevance  Review of Systems       The patient complains of anorexia and abnormal bleeding.  The patient denies fever, weight loss, chest pain, dyspnea on exertion, prolonged cough, abdominal pain, melena, hematochezia, hematuria, enlarged lymph nodes, and angioedema.    Physical Exam  General:  WNWD white female in no acute distress Head:  normocephalic and atraumatic.   Eyes:  C&S without icterus Neck:  supple.   Chest Wall:  no deformities.  diffusely  tender Lungs:  normal respiratory effort and normal breath sounds.   Heart:  normal rate and regular rhythm.   Abdomen:  soft, normal bowel sounds, and no guarding.   Msk:  no joint tenderness, no joint swelling, and no joint warmth.   Pulses:  2+ radial Neurologic:  alert & oriented X3, cranial nerves II-XII intact, gait normal, and DTRs symmetrical and normal.   Skin:  turgor normal, color normal, no suspicious lesions, and no ecchymoses.   Cervical Nodes:  no anterior cervical adenopathy and no posterior cervical adenopathy.   Psych:  Oriented X3, normally interactive, and good eye contact.     Impression & Recommendations:  Problem # 1:  NAUSEA (ICD-787.02) Patient with hemetemesis following several forceful episodes of emesis c/w mallory-weiss tear of a minor nature. She has had no repeat episodes and no dysphagia.  Plan - will check CBC           control nausea  Orders: TLB-CBC Platelet - w/Differential (85025-CBCD)  Addendum - CBC normal  Problem # 2:  TRANSAMINASES, SERUM, ELEVATED (ICD-790.4) Will recheck LFTs to r/o cause of emesis.  Orders: TLB-Hepatic/Liver Function Pnl (80076-HEPATIC)  Addendum - mild elevation                                                            02/05/10        03/23/09         6/'10                                   AST                   42.                   34                56                                   ALT                   48                    33                66  Alk Phos          140                   94               160  Question of intermittent bouts of hepatitis vs medication related vs other  Plan - chronic hep screen                                                                Problem # 3:  ACUTE CYSTITIS (ICD-595.0)  Patient with discolored urine and minimal symptoms but positive U/A for LE, WBC and blood  Plan - cipro 250 mg two times a day x 5  Her updated medication list  for this problem includes:    Ciprofloxacin Hcl 250 Mg Tabs (Ciprofloxacin hcl) .Marland Kitchen... 1 by mouth two times a day x 5 days  Complete Medication List: 1)  Dolophine 10 Mg Tabs (Methadone hcl) .... 3 tabs by mouth four times a day. fill on or after 04/07/2010 2)  Diazepam 2 Mg Tabs (Diazepam) .Marland Kitchen.. 1 by mouth three times a day for muscle spasm; 2 at bedtime for sleep as needed. 3)  Zomig 5 Mg Tabs (Zolmitriptan) .... As needed 4)  Levothyroxine Sodium 50 Mcg Tabs (Levothyroxine sodium) .... Once daily 5)  Centurm  .... Take 1 tablet by mouth once a day 6)  Ca+d  .... Take 1 tablet by mouth once a day 7)  Prevnara  .... Take 1 tablet by mouth once a day 8)  Pepcid Ac 10 Mg Tabs (Famotidine) .... Take 1 tab twice daily 9)  Benefiber Powd (Wheat dextrin) .... Take 1 tablespoon by mouth dissolved in water once daily 10)  Align Caps (Probiotic product) .... Take 1 capsule by mouth once a day 11)  Miralax Powd (Polyethylene glycol 3350) .... Take 1 scoop (17 grams) dissolved in water/juice once daily 12)  Anusol-hc 25 Mg Supp (Hydrocortisone acetate) .... Put 1 in rectum 2 times daily. 13)  Cholestyramine Powd (Cholestyramine) .Marland Kitchen.. 1 scoop - 4 g - once daily or two times a day for diarrhea and cholesterol 14)  Premarin 0.3 Mg Tabs (Estrogens conjugated) .Marland Kitchen.. 1 by mouth once daily 15)  Amitriptyline Hcl 50 Mg Tabs (Amitriptyline hcl) .... At bedtime 16)  Ciprofloxacin Hcl 250 Mg Tabs (Ciprofloxacin hcl) .Marland Kitchen.. 1 by mouth two times a day x 5 days  Other Orders: TLB-BMP (Basic Metabolic Panel-BMET) (80048-METABOL) TLB-Udip w/ Micro (81001-URINE) Prescriptions: CIPROFLOXACIN HCL 250 MG TABS (CIPROFLOXACIN HCL) 1 by mouth two times a day x 5 days  #10 x 0   Entered and Authorized by:   Jacques Navy MD   Signed by:   Jacques Navy MD on 02/06/2010   Method used:   Electronically to        [facility]* (retail)       [address]       [place], [place]  [address]       Ph:  [phone]       Fax: [phone]   RxID:   [id number] CHOLESTYRAMINE  POWD (CHOLESTYRAMINE) 1 scoop - 4 g - once daily or two times a day for diarrhea and cholesterol  #1 month supp x  3   Entered by:   Bill Salinas CMA   Authorized by:   Jacques Navy MD   Signed by:   Bill Salinas CMA on 02/04/2010   Method used:   Faxed to ...       [facility]* (retail)       [address]       [place], [place]  [address]       Ph: [phone]       Fax: [phone]   RxID:   [id number] MIRALAX  POWD (POLYETHYLENE GLYCOL 3350) Take 1 scoop (17 grams) dissolved in water/juice once daily  #527 grams x 10   Entered by:   Ami Bullins CMA   Authorized by:   Jacques Navy MD   Signed by:   Bill Salinas CMA on 02/04/2010   Method used:   Faxed to ...       [facility]* (retail)       [address]       [place], [place]  [address]       Ph: [phone]       Fax: [phone]   RxID:   [id number] PEPCID AC 10 MG TABS (FAMOTIDINE) Take 1 tab twice daily  #60 x 3   Entered by:   Ami Bullins CMA   Authorized by:   Jacques Navy MD   Signed by:   Bill Salinas CMA on 02/04/2010   Method used:   Faxed to ...       [facility]* (retail)       [address]       [place], [place]  [address]       Ph: [phone]       Fax: [phone]   RxID:   [id number] LEVOTHYROXINE SODIUM 50 MCG  TABS (LEVOTHYROXINE SODIUM) once daily  #100 Each x 5   Entered by:   Bill Salinas CMA   Authorized by:   Jacques Navy MD   Signed by:   Bill Salinas CMA on 02/04/2010   Method used:   Faxed to ...       [facility]* (retail)       [address]       [place], [place]  [address]       Ph: [phone]       Fax: [phone]   RxID:   [id number] DOLOPHINE 10 MG  TABS (METHADONE HCL) 3 tabs by mouth four times a day. Fill on or after 04/07/2010  #360 x 0   Entered by:   Ami Bullins CMA   Authorized by:   Jacques Navy  MD   Signed by:   Bill Salinas CMA on 02/04/2010   Method used:   Print then Give to Patient   RxID:   [id number] DOLOPHINE 10 MG  TABS (METHADONE HCL) 3 tabs by mouth four times a day. Fill on or after 03/08/2010  #360 x 0   Entered by:   Ami Bullins CMA   Authorized by:   Jacques Navy MD   Signed by:   Bill Salinas CMA on 02/04/2010   Method used:   Print then Give to Patient   RxID:   [id number] DOLOPHINE 10 MG  TABS (METHADONE HCL) 3 tabs by mouth four times a day. Fill on or after 02/05/2010  #360 x 0   Entered by:   Ami Bullins CMA   Authorized by:   Rosalyn Gess  Norins MD   Signed by:   Bill Salinas CMA on 02/04/2010   Method used:   Print then Give to Patient   RxID:   [id number]
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
## 2010-05-26 NOTE — Progress Notes (Signed)
  Phone Note Refill Request Message from:  Fax from Pharmacy on April 15, 2010 10:07 AM  Refills Requested: Medication #1:  DIAZEPAM 2 MG TABS 1 by mouth three times a day for muscle spasm; 2 at bedtime for sleep as needed. please Advise refill in Dr Debby Bud absence  Initial call taken by: Ami Bullins CMA,  April 15, 2010 10:07 AM  Follow-up for Phone Call        printed out signed by Dr Jonny Ruiz and faxed to pharm ([facility]) Follow-up by: Ami Bullins CMA,  April 15, 2010 10:58 AM    New/Updated Medications: DIAZEPAM 2 MG TABS (DIAZEPAM) 1 by mouth three times a day for muscle spasm; 2 at bedtime for sleep as needed. Prescriptions: DIAZEPAM 2 MG TABS (DIAZEPAM) 1 by mouth three times a day for muscle spasm; 2 at bedtime for sleep as needed.  #150 x 2   Entered and Authorized by:   Corwin Levins MD   Signed by:   Corwin Levins MD on 04/15/2010   Method used:   Print then Give to Patient   RxID:   [id number]  done hardcopy to [facility] side B - dahlia Corwin Levins MD  April 15, 2010 10:49 AM

## 2010-06-10 ENCOUNTER — Telehealth: Payer: Self-pay | Admitting: Internal Medicine

## 2010-06-10 ENCOUNTER — Ambulatory Visit: Payer: Self-pay | Admitting: Internal Medicine

## 2010-06-13 ENCOUNTER — Encounter (INDEPENDENT_AMBULATORY_CARE_PROVIDER_SITE_OTHER): Payer: Self-pay | Admitting: *Deleted

## 2010-06-13 ENCOUNTER — Other Ambulatory Visit: Payer: Self-pay

## 2010-06-13 ENCOUNTER — Encounter: Payer: Self-pay | Admitting: Internal Medicine

## 2010-06-13 ENCOUNTER — Ambulatory Visit (INDEPENDENT_AMBULATORY_CARE_PROVIDER_SITE_OTHER): Payer: Self-pay | Admitting: Internal Medicine

## 2010-06-13 DIAGNOSIS — K219 Gastro-esophageal reflux disease without esophagitis: Secondary | ICD-10-CM

## 2010-06-13 DIAGNOSIS — R079 Chest pain, unspecified: Secondary | ICD-10-CM

## 2010-06-13 DIAGNOSIS — K5900 Constipation, unspecified: Secondary | ICD-10-CM

## 2010-06-13 DIAGNOSIS — E781 Pure hyperglyceridemia: Secondary | ICD-10-CM | POA: Insufficient documentation

## 2010-06-13 DIAGNOSIS — L719 Rosacea, unspecified: Secondary | ICD-10-CM | POA: Insufficient documentation

## 2010-06-13 DIAGNOSIS — N951 Menopausal and female climacteric states: Secondary | ICD-10-CM | POA: Insufficient documentation

## 2010-06-13 LAB — CONVERTED CEMR LAB: Tissue Transglutaminase Ab, IgA: 6.9 units (ref <20)

## 2010-06-15 NOTE — Progress Notes (Signed)
Summary: Methadone  Phone Note Refill Request   Refills Requested: Medication #1:  DOLOPHINE 10 MG  TABS 3 tabs by mouth four times a day. Fill on or after 04/07/2010 REQ refill for 3 doses or one month - pt will be out of med today. Has migraine w/vomiting, needs to go to sleep. She is willing to come in this pm if apt opens otherwise is scheduled for f/u office visit Monday.   Initial call taken by: Lamar Sprinkles, CMA,  June 10, 2010 9:14 AM  Follow-up for Phone Call        ok for scripts x 3. Does she have someone who can pick them up? Follow-up by: Jacques Navy MD,  June 10, 2010 10:53 AM  Additional Follow-up for Phone Call Additional follow up Details #1::        Pt was made aware by scheduler that rxs were ready for pick up and to keep her apt next week Additional Follow-up by: Lamar Sprinkles, CMA,  June 10, 2010 2:55 PM    New/Updated Medications: DOLOPHINE 10 MG  TABS (METHADONE HCL) 3 tabs by mouth four times a day. Fill on or after 06/10/2010 DOLOPHINE 10 MG  TABS (METHADONE HCL) 3 tabs by mouth four times a day. Fill on or after 07/09/2010 DOLOPHINE 10 MG  TABS (METHADONE HCL) 3 tabs by mouth four times a day. Fill on or after 08/09/2010 Prescriptions: DOLOPHINE 10 MG  TABS (METHADONE HCL) 3 tabs by mouth four times a day. Fill on or after 08/09/2010  #360 x 0   Entered by:   Lamar Sprinkles, CMA   Authorized by:   Jacques Navy MD   Signed by:   Lamar Sprinkles, CMA on 06/10/2010   Method used:   Print then Give to Patient   RxID:   [id number] DOLOPHINE 10 MG  TABS (METHADONE HCL) 3 tabs by mouth four times a day. Fill on or after 07/09/2010  #360 x 0   Entered by:   Lamar Sprinkles, CMA   Authorized by:   Jacques Navy MD   Signed by:   Lamar Sprinkles, CMA on 06/10/2010   Method used:   Print then Give to Patient   RxID:   [id number] DOLOPHINE 10 MG  TABS (METHADONE HCL) 3 tabs by mouth four times a day. Fill on or after 06/10/2010  #360 x 0  Entered by:   Lamar Sprinkles, CMA   Authorized by:   Jacques Navy MD   Signed by:   Lamar Sprinkles, CMA on 06/10/2010   Method used:   Print then Give to Patient   RxID:   [id number]

## 2010-06-19 ENCOUNTER — Telehealth: Payer: Self-pay | Admitting: Internal Medicine
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
## 2010-06-30 NOTE — Progress Notes (Signed)
  Phone Note Outgoing Call   Reason for Call: Discuss lab or test results Summary of Call: Please call patinet - normal celiac panel.  thanks Initial call taken by: Jacques Navy MD,  June 19, 2010 11:32 PM  Follow-up for Phone Call        informed pt  Follow-up by: Ami Bullins CMA,  June 20, 2010 11:35 AM

## 2010-07-09 LAB — COMPREHENSIVE METABOLIC PANEL
AST: 93 U/L — ABNORMAL HIGH (ref 0–37)
Albumin: 3.9 g/dL (ref 3.5–5.2)
Alkaline Phosphatase: 138 U/L — ABNORMAL HIGH (ref 39–117)
Chloride: 99 mEq/L (ref 96–112)
Creatinine, Ser: 0.86 mg/dL (ref 0.4–1.2)
GFR calc Af Amer: >60 mL/min (ref >60)
Potassium: 3.5 mEq/L (ref 3.5–5.1)
Total Bilirubin: 0.3 mg/dL (ref 0.3–1.2)
Total Protein: 7.8 g/dL (ref 6.0–8.3)

## 2010-07-09 LAB — CBC
HCT: 33.8 % — ABNORMAL LOW (ref 36.0–46.0)
HCT: 37.6 % (ref 36.0–46.0)
Hemoglobin: 12.5 g/dL (ref 12.0–15.0)
MCHC: 33.1 g/dL (ref 30.0–36.0)
MCHC: 33.3 g/dL (ref 30.0–36.0)
MCV: 85.7 fL (ref 78.0–100.0)
MCV: 87.3 fL (ref 78.0–100.0)
Platelets: 244 10*3/uL (ref 150–400)
Platelets: 286 10*3/uL (ref 150–400)
RBC: 4.39 MIL/uL (ref 3.87–5.11)
RDW: 13.9 % (ref 11.5–15.5)
RDW: 14.4 % (ref 11.5–15.5)
WBC: 5.7 10*3/uL (ref 4.0–10.5)
WBC: 5.7 10*3/uL (ref 4.0–10.5)

## 2010-07-09 LAB — DIFFERENTIAL
Basophils Absolute: 0 10*3/uL (ref 0.0–0.1)
Basophils Relative: 1 % (ref 0–1)
Eosinophils Absolute: 0.1 K/uL (ref 0.0–0.7)
Eosinophils Relative: 2 % (ref 0–5)
Lymphocytes Relative: 32 % (ref 12–46)
Lymphs Abs: 1.8 K/uL (ref 0.7–4.0)
Monocytes Absolute: 0.5 10*3/uL (ref 0.1–1.0)
Monocytes Relative: 8 % (ref 3–12)
Neutro Abs: 3.3 K/uL (ref 1.7–7.7)
Neutrophils Relative %: 58 % (ref 43–77)

## 2010-07-09 LAB — PROTIME-INR
INR: 0.95 (ref 0.00–1.49)
Prothrombin Time: 12.6 s (ref 11.6–15.2)

## 2010-07-09 LAB — STOOL CULTURE

## 2010-07-09 LAB — BASIC METABOLIC PANEL
BUN: 8 mg/dL (ref 6–23)
CO2: 28 mEq/L (ref 19–32)
Chloride: 102 mEq/L (ref 96–112)
Creatinine, Ser: 0.88 mg/dL (ref 0.4–1.2)
Glucose, Bld: 96 mg/dL (ref 70–99)
Potassium: 3.7 mEq/L (ref 3.5–5.1)

## 2010-07-09 LAB — CLOSTRIDIUM DIFFICILE EIA: C difficile Toxins A+B, EIA: NEGATIVE

## 2010-07-09 LAB — TYPE AND SCREEN
ABO/RH(D): A NEG
Antibody Screen: NEGATIVE

## 2010-07-09 LAB — COMPREHENSIVE METABOLIC PANEL WITH GFR
ALT: 85 U/L — ABNORMAL HIGH (ref 0–35)
BUN: 12 mg/dL (ref 6–23)
CO2: 29 meq/L (ref 19–32)
Calcium: 9.3 mg/dL (ref 8.4–10.5)
GFR calc non Af Amer: >60 mL/min (ref >60)
Glucose, Bld: 109 mg/dL — ABNORMAL HIGH (ref 70–99)
Sodium: 137 meq/L (ref 135–145)

## 2010-07-09 LAB — URINE MICROSCOPIC-ADD ON

## 2010-07-09 LAB — URINALYSIS, ROUTINE W REFLEX MICROSCOPIC
Bilirubin Urine: NEGATIVE
Glucose, UA: NEGATIVE mg/dL
Hgb urine dipstick: NEGATIVE
Ketones, ur: NEGATIVE mg/dL
Nitrite: NEGATIVE
Protein, ur: NEGATIVE mg/dL
Specific Gravity, Urine: 1.018 (ref 1.005–1.030)
Urobilinogen, UA: 0.2 mg/dL (ref 0.0–1.0)
pH: 8 (ref 5.0–8.0)

## 2010-07-09 LAB — HEMOCCULT GUIAC POC 1CARD (OFFICE): Fecal Occult Bld: POSITIVE

## 2010-07-09 LAB — HEMOGLOBIN AND HEMATOCRIT, BLOOD
HCT: 33.6 % — ABNORMAL LOW (ref 36.0–46.0)
HCT: 33.6 % — ABNORMAL LOW (ref 36.0–46.0)
Hemoglobin: 11 g/dL — ABNORMAL LOW (ref 12.0–15.0)
Hemoglobin: 11.1 g/dL — ABNORMAL LOW (ref 12.0–15.0)
Hemoglobin: 11.2 g/dL — ABNORMAL LOW (ref 12.0–15.0)

## 2010-07-09 LAB — LIPASE, BLOOD: Lipase: 15 U/L (ref 11–59)

## 2010-07-09 LAB — ABO/RH: ABO/RH(D): A NEG

## 2010-07-09 LAB — LACTIC ACID, PLASMA: Lactic Acid, Venous: 0.8 mmol/L (ref 0.5–2.2)

## 2010-07-09 LAB — APTT: aPTT: 28 s (ref 24–37)

## 2010-07-15 LAB — URINALYSIS, ROUTINE W REFLEX MICROSCOPIC
Bilirubin Urine: NEGATIVE
Bilirubin Urine: NEGATIVE
Glucose, UA: NEGATIVE mg/dL
Ketones, ur: NEGATIVE mg/dL
Nitrite: NEGATIVE
Nitrite: NEGATIVE
Protein, ur: 30 mg/dL — AB
Protein, ur: NEGATIVE mg/dL
Specific Gravity, Urine: 1.026 (ref 1.005–1.030)
Urobilinogen, UA: 0.2 mg/dL (ref 0.0–1.0)
Urobilinogen, UA: 0.2 mg/dL (ref 0.0–1.0)
pH: 7 (ref 5.0–8.0)
pH: 7 (ref 5.0–8.0)

## 2010-07-15 LAB — DIFFERENTIAL
Basophils Relative: 2 % — ABNORMAL HIGH (ref 0–1)
Eosinophils Absolute: 0 10*3/uL (ref 0.0–0.7)
Eosinophils Absolute: 0 10*3/uL (ref 0.0–0.7)
Eosinophils Absolute: 0.1 10*3/uL (ref 0.0–0.7)
Eosinophils Relative: 0 % (ref 0–5)
Lymphocytes Relative: 24 % (ref 12–46)
Lymphs Abs: 1.2 10*3/uL (ref 0.7–4.0)
Lymphs Abs: 1.3 10*3/uL (ref 0.7–4.0)
Lymphs Abs: 1.8 10*3/uL (ref 0.7–4.0)
Monocytes Absolute: 0.8 10*3/uL (ref 0.1–1.0)
Monocytes Relative: 12 % (ref 3–12)
Monocytes Relative: 6 % (ref 3–12)
Neutro Abs: 3.8 10*3/uL (ref 1.7–7.7)
Neutrophils Relative %: 61 % (ref 43–77)
Neutrophils Relative %: 62 % (ref 43–77)
Neutrophils Relative %: 86 % — ABNORMAL HIGH (ref 43–77)

## 2010-07-15 LAB — BLOOD GAS, ARTERIAL
O2 Saturation: 97.5 %
Patient temperature: 98.6
TCO2: 15.4 mmol/L (ref 0–100)
pH, Arterial: 7.554 — ABNORMAL HIGH (ref 7.350–7.400)

## 2010-07-15 LAB — HEPATIC FUNCTION PANEL
Albumin: 3.6 g/dL (ref 3.5–5.2)
Total Protein: 7.7 g/dL (ref 6.0–8.3)

## 2010-07-15 LAB — COMPREHENSIVE METABOLIC PANEL
ALT: 200 U/L — ABNORMAL HIGH (ref 0–35)
Alkaline Phosphatase: 174 U/L — ABNORMAL HIGH (ref 39–117)
BUN: 8 mg/dL (ref 6–23)
CO2: 27 mEq/L (ref 19–32)
Calcium: 8.9 mg/dL (ref 8.4–10.5)
GFR calc non Af Amer: 60 mL/min — ABNORMAL LOW (ref >60)
Glucose, Bld: 80 mg/dL (ref 70–99)
Sodium: 140 mEq/L (ref 135–145)
Total Protein: 7.1 g/dL (ref 6.0–8.3)

## 2010-07-15 LAB — POCT I-STAT, CHEM 8
BUN: 15 mg/dL (ref 6–23)
Creatinine, Ser: 0.9 mg/dL (ref 0.4–1.2)
Glucose, Bld: 114 mg/dL — ABNORMAL HIGH (ref 70–99)
Potassium: 3.6 mEq/L (ref 3.5–5.1)
Sodium: 139 mEq/L (ref 135–145)

## 2010-07-15 LAB — BASIC METABOLIC PANEL
BUN: 8 mg/dL (ref 6–23)
CO2: 21 mEq/L (ref 19–32)
CO2: 28 mEq/L (ref 19–32)
CO2: 31 mEq/L (ref 19–32)
Calcium: 8.2 mg/dL — ABNORMAL LOW (ref 8.4–10.5)
Chloride: 102 mEq/L (ref 96–112)
Chloride: 102 mEq/L (ref 96–112)
Chloride: 99 mEq/L (ref 96–112)
Creatinine, Ser: 0.75 mg/dL (ref 0.4–1.2)
GFR calc Af Amer: 48 mL/min — ABNORMAL LOW (ref >60)
GFR calc Af Amer: >60 mL/min (ref >60)
Glucose, Bld: 102 mg/dL — ABNORMAL HIGH (ref 70–99)
Potassium: 3.3 mEq/L — ABNORMAL LOW (ref 3.5–5.1)
Potassium: 3.6 mEq/L (ref 3.5–5.1)
Potassium: 3.7 mEq/L (ref 3.5–5.1)
Sodium: 138 mEq/L (ref 135–145)
Sodium: 142 mEq/L (ref 135–145)

## 2010-07-15 LAB — CBC
HCT: 31.9 % — ABNORMAL LOW (ref 36.0–46.0)
HCT: 33.9 % — ABNORMAL LOW (ref 36.0–46.0)
HCT: 36.1 % (ref 36.0–46.0)
HCT: 38.3 % (ref 36.0–46.0)
HCT: 38.9 % (ref 36.0–46.0)
Hemoglobin: 10.9 g/dL — ABNORMAL LOW (ref 12.0–15.0)
Hemoglobin: 11.5 g/dL — ABNORMAL LOW (ref 12.0–15.0)
Hemoglobin: 11.9 g/dL — ABNORMAL LOW (ref 12.0–15.0)
Hemoglobin: 12.9 g/dL (ref 12.0–15.0)
MCHC: 33 g/dL (ref 30.0–36.0)
MCHC: 33.6 g/dL (ref 30.0–36.0)
MCHC: 34 g/dL (ref 30.0–36.0)
MCV: 86.1 fL (ref 78.0–100.0)
MCV: 86.4 fL (ref 78.0–100.0)
MCV: 87.1 fL (ref 78.0–100.0)
MCV: 88.3 fL (ref 78.0–100.0)
Platelets: 227 10*3/uL (ref 150–400)
RBC: 3.9 MIL/uL (ref 3.87–5.11)
RBC: 4.1 MIL/uL (ref 3.87–5.11)
RBC: 4.4 MIL/uL (ref 3.87–5.11)
RBC: 4.45 MIL/uL (ref 3.87–5.11)
RDW: 15 % (ref 11.5–15.5)
RDW: 15.2 % (ref 11.5–15.5)
WBC: 14.1 10*3/uL — ABNORMAL HIGH (ref 4.0–10.5)
WBC: 5.2 10*3/uL (ref 4.0–10.5)
WBC: 7.6 10*3/uL (ref 4.0–10.5)

## 2010-07-15 LAB — URINE MICROSCOPIC-ADD ON

## 2010-07-15 LAB — RENAL FUNCTION PANEL
BUN: 8 mg/dL (ref 6–23)
CO2: 29 mEq/L (ref 19–32)
Calcium: 7.6 mg/dL — ABNORMAL LOW (ref 8.4–10.5)
Creatinine, Ser: 0.63 mg/dL (ref 0.4–1.2)
Glucose, Bld: 136 mg/dL — ABNORMAL HIGH (ref 70–99)
Sodium: 141 mEq/L (ref 135–145)

## 2010-07-15 LAB — TSH: TSH: 0.361 u[IU]/mL (ref 0.350–4.500)

## 2010-07-15 LAB — POCT I-STAT 3, ART BLOOD GAS (G3+)
Bicarbonate: 30.5 mEq/L — ABNORMAL HIGH (ref 20.0–24.0)
Patient temperature: 98.1
TCO2: 32 mmol/L (ref 0–100)
pH, Arterial: 7.577 — ABNORMAL HIGH (ref 7.350–7.400)

## 2010-07-15 LAB — HEPATITIS B CORE ANTIBODY, TOTAL: Hep B Core Total Ab: NEGATIVE

## 2010-07-15 LAB — URINE CULTURE: Colony Count: NO GROWTH

## 2010-07-15 LAB — AMMONIA: Ammonia: 18 umol/L (ref 11–35)

## 2010-07-15 LAB — ETHANOL: Alcohol, Ethyl (B): <5 mg/dL (ref 0–10)

## 2010-07-15 LAB — RAPID URINE DRUG SCREEN, HOSP PERFORMED
Amphetamines: NOT DETECTED
Barbiturates: NOT DETECTED
Benzodiazepines: POSITIVE — AB
Tetrahydrocannabinol: NOT DETECTED

## 2010-07-15 LAB — ALT: ALT: 134 U/L — ABNORMAL HIGH (ref 0–35)

## 2010-07-15 LAB — T3: T3, Total: 36.7 ng/dl — ABNORMAL LOW (ref 80.0–204.0)

## 2010-07-15 LAB — MRSA PCR SCREENING: MRSA by PCR: NEGATIVE

## 2010-07-15 LAB — PROTIME-INR: INR: 1.06 (ref 0.00–1.49)

## 2010-07-15 LAB — POCT CARDIAC MARKERS: Myoglobin, poc: 125 ng/mL (ref 12–200)

## 2010-07-15 LAB — LIPASE, BLOOD: Lipase: 13 U/L (ref 11–59)

## 2010-07-15 LAB — POCT PREGNANCY, URINE: Preg Test, Ur: NEGATIVE

## 2010-07-15 LAB — HEPATITIS B SURFACE ANTIGEN: Hepatitis B Surface Ag: NEGATIVE

## 2010-07-18 LAB — HEPATIC FUNCTION PANEL
ALT: 81 U/L — ABNORMAL HIGH (ref 0–35)
Alkaline Phosphatase: 112 U/L (ref 39–117)
Bilirubin, Direct: 0.1 mg/dL (ref 0.0–0.3)
Indirect Bilirubin: 0.7 mg/dL (ref 0.3–0.9)

## 2010-07-24 DIAGNOSIS — T59811A Toxic effect of smoke, accidental (unintentional), initial encounter: Secondary | ICD-10-CM

## 2010-07-24 HISTORY — DX: Toxic effect of smoke, accidental (unintentional), initial encounter: T59.811A

## 2010-08-05 ENCOUNTER — Emergency Department (HOSPITAL_COMMUNITY): Payer: Self-pay

## 2010-08-05 ENCOUNTER — Observation Stay (HOSPITAL_COMMUNITY)
Admission: EM | Admit: 2010-08-05 | Discharge: 2010-08-06 | Disposition: A | Discharge status: Home / Self Care | Payer: Self-pay | Attending: Internal Medicine | Admitting: Internal Medicine

## 2010-08-05 DIAGNOSIS — Z79899 Other long term (current) drug therapy: Secondary | ICD-10-CM | POA: Insufficient documentation

## 2010-08-05 DIAGNOSIS — E039 Hypothyroidism, unspecified: Secondary | ICD-10-CM | POA: Insufficient documentation

## 2010-08-05 DIAGNOSIS — K219 Gastro-esophageal reflux disease without esophagitis: Secondary | ICD-10-CM | POA: Insufficient documentation

## 2010-08-05 DIAGNOSIS — R55 Syncope and collapse: Principal | ICD-10-CM | POA: Insufficient documentation

## 2010-08-05 DIAGNOSIS — X001XXA Exposure to smoke in uncontrolled fire in building or structure, initial encounter: Secondary | ICD-10-CM | POA: Insufficient documentation

## 2010-08-05 DIAGNOSIS — J705 Respiratory conditions due to smoke inhalation: Secondary | ICD-10-CM | POA: Insufficient documentation

## 2010-08-05 DIAGNOSIS — R05 Cough: Secondary | ICD-10-CM | POA: Insufficient documentation

## 2010-08-05 DIAGNOSIS — G43909 Migraine, unspecified, not intractable, without status migrainosus: Secondary | ICD-10-CM | POA: Insufficient documentation

## 2010-08-05 DIAGNOSIS — IMO0001 Reserved for inherently not codable concepts without codable children: Secondary | ICD-10-CM | POA: Insufficient documentation

## 2010-08-05 DIAGNOSIS — R059 Cough, unspecified: Secondary | ICD-10-CM | POA: Insufficient documentation

## 2010-08-05 LAB — DIFFERENTIAL
Basophils Absolute: 0 10*3/uL (ref 0.0–0.1)
Basophils Relative: 0 % (ref 0–1)
Eosinophils Relative: 1 % (ref 0–5)
Lymphocytes Relative: 18 % (ref 12–46)
Monocytes Absolute: 0.8 10*3/uL (ref 0.1–1.0)
Monocytes Relative: 9 % (ref 3–12)

## 2010-08-05 LAB — BLOOD GAS, ARTERIAL
Acid-Base Excess: 2.7 mmol/L — ABNORMAL HIGH (ref 0.0–2.0)
Bicarbonate: 28.6 mEq/L — ABNORMAL HIGH (ref 20.0–24.0)
TCO2: 26.4 mmol/L (ref 0–100)
pCO2 arterial: 53 mmHg — ABNORMAL HIGH (ref 35.0–45.0)
pH, Arterial: 7.352 (ref 7.350–7.400)
pO2, Arterial: 329 mmHg — ABNORMAL HIGH (ref 80.0–100.0)

## 2010-08-05 LAB — POCT CARDIAC MARKERS
Myoglobin, poc: >500 ng/mL (ref 12–200)
Troponin i, poc: <0.05 ng/mL (ref 0.00–0.09)

## 2010-08-05 LAB — CBC
HCT: 38.4 % (ref 36.0–46.0)
MCH: 27.8 pg (ref 26.0–34.0)
MCHC: 32.6 g/dL (ref 30.0–36.0)
RDW: 14 % (ref 11.5–15.5)

## 2010-08-05 LAB — COMPREHENSIVE METABOLIC PANEL
ALT: 67 U/L — ABNORMAL HIGH (ref 0–35)
Alkaline Phosphatase: 111 U/L (ref 39–117)
CO2: 27 mEq/L (ref 19–32)
Calcium: 9.1 mg/dL (ref 8.4–10.5)
Chloride: 98 mEq/L (ref 96–112)
GFR calc non Af Amer: >60 mL/min (ref >60)
Glucose, Bld: 99 mg/dL (ref 70–99)
Potassium: 4 mEq/L (ref 3.5–5.1)
Sodium: 135 mEq/L (ref 135–145)
Total Bilirubin: 0.5 mg/dL (ref 0.3–1.2)

## 2010-08-05 LAB — CARBOXYHEMOGLOBIN: O2 Saturation: 69.3 %

## 2010-08-06 DIAGNOSIS — J705 Respiratory conditions due to smoke inhalation: Secondary | ICD-10-CM

## 2010-08-06 DIAGNOSIS — R55 Syncope and collapse: Secondary | ICD-10-CM

## 2010-08-06 LAB — TSH: TSH: 2.409 u[IU]/mL (ref 0.350–4.500)

## 2010-08-11 ENCOUNTER — Telehealth: Payer: Self-pay | Admitting: Internal Medicine

## 2010-08-11 NOTE — Telephone Encounter (Signed)
Ok for early refill on diazepam.
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
## 2010-08-11 NOTE — Telephone Encounter (Signed)
Pt called back stating that she is going [place] town to the beach for a [place] days and the pharmacy is filling her Methadone - she dropped off her Rx for Diazepam refill that she states "could be refilled through June '12" - but pharmacy is needing authorization for what appears to be an early refill. [last Rx in EMR was 04/15/10 #150x2] She has to meet her Insurance adjusters at her home but is hoping to be back to the pharmacy by 03:30pm if we could get to this by then. Please advise.
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
## 2010-08-12 MED ORDER — DIAZEPAM 2 MG PO TABS: 2.0000 mg | ORAL_TABLET | Freq: Three times a day (TID) | ORAL | Status: DC

## 2010-08-12 NOTE — Telephone Encounter (Signed)
Patient Name:    [name], [name]    Patient DOB:     August 13, 1950    Medication:      DIAZEPAM 2 MG TABLET   NDCNUM:          [id number]   Instructions:    TAKE 1 TABLET 3 TIMES A DAY FOR MUSCLE SPASMS, 2AT BEDTIME FOR SLEEP AS NEEDED.   Quantity:        150 Each   Refills:         0    Date of Request: 08/11/2009     Pharmacy:        [facility]   Last Fill Date:  04/15/2010   Pharmacy Phone:  [phone]
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
## 2010-08-30 ENCOUNTER — Encounter: Payer: Self-pay | Admitting: Internal Medicine

## 2010-08-30 ENCOUNTER — Ambulatory Visit: Payer: Self-pay | Admitting: Internal Medicine

## 2010-08-31 ENCOUNTER — Ambulatory Visit: Payer: Self-pay | Admitting: Internal Medicine

## 2010-09-01 ENCOUNTER — Ambulatory Visit: Payer: Self-pay | Admitting: Internal Medicine

## 2010-09-01 ENCOUNTER — Ambulatory Visit (INDEPENDENT_AMBULATORY_CARE_PROVIDER_SITE_OTHER): Payer: Self-pay | Admitting: Internal Medicine

## 2010-09-01 DIAGNOSIS — R05 Cough: Secondary | ICD-10-CM

## 2010-09-01 DIAGNOSIS — IMO0001 Reserved for inherently not codable concepts without codable children: Secondary | ICD-10-CM

## 2010-09-01 DIAGNOSIS — K589 Irritable bowel syndrome without diarrhea: Secondary | ICD-10-CM

## 2010-09-01 MED ORDER — CLOTRIMAZOLE-BETAMETHASONE 1-0.05 % EX CREA: TOPICAL_CREAM | CUTANEOUS | Status: DC

## 2010-09-01 MED ORDER — BENZONATATE 100 MG PO CAPS: 100.0000 mg | ORAL_CAPSULE | Freq: Three times a day (TID) | ORAL | Status: DC | PRN

## 2010-09-01 MED ORDER — METHADONE HCL 10 MG PO TABS: 10.0000 mg | ORAL_TABLET | Freq: Three times a day (TID) | ORAL | Status: DC
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
## 2010-09-01 NOTE — Patient Instructions (Signed)
Bowel regimen - ok to continue with the  Benefiber. For acute constipation - milk of magnesia is simple and safe to use: 30 cc every 4 hrs until BM or 4 doses in 24 hrs.  Cough - lungs are clear. Sounds like an irritative cough. Plan - trial of benzonatate 100mg  three times a day.   Chronic pain management - refills to day for the next 3 months.  Hormone replace - deleted medications from list.

## 2010-09-02 NOTE — Progress Notes (Signed)
  Subjective:    Patient ID: [name], female    DOB: 1951-01-10, 60 y.o.   MRN: [id number]  HPI    Review of Systems     Objective:   Physical Exam        Assessment & Plan:  4. Rash on elbow - may be fungal  Plan - lotrisone apply bid to rash

## 2010-09-06 NOTE — Assessment & Plan Note (Signed)
[facility]                                 ON-CALL NOTE   DONASIA, WIMES                        MRN:          [id number]  DATE:07/19/2007                            DOB:          01-Nov-1950    The pharmacy called from [phone] at 6:53 p.m. on July 19, 2007 stating  that Dr. Debby Bud wrote a prescription for methadone, had the wrong date  on it.  He wrote Sep 01, 2007 on the prescription, and the patient  stated it was supposed to be a carry-over script.  I did look at the  note on EMR, which did talk about in the note that the patient was using  too much methadone and she had been specifically told not to do that and  that he would write a carry-over script, but the prescription did say  not to fill until May 10th.  Patient was given a prescription on what  looks like the end of February that was not supposed to be filled until  April 10th.   I spoke with the pharmacist.  I said I did not feel comfortable filling  the methadone prescription over the phone but would leave a message for  Dr. Debby Bud asking if he could fill the prescription on Monday, if that  was a mistake.     Lelon Perla, DO  Electronically Signed    Shawnie Dapper  DD: 07/19/2007  DT: 07/19/2007  Job #: [id number]   cc:   Rosalyn Gess. Norins, MD

## 2010-09-06 NOTE — Consult Note (Signed)
[name], PAONE                 ACCOUNT NO.:  [id number]   MEDICAL RECORD NO.:  [id number]          PATIENT TYPE:  INP   LOCATION:  2916                         FACILITY:  [facility]   PHYSICIAN:  Marlan Palau, M.D.  DATE OF BIRTH:  10-11-1950   DATE OF CONSULTATION:  08/23/2007  DATE OF DISCHARGE:                                 CONSULTATION   HISTORY OF PRESENT ILLNESS:  [name] is a 60 year old right-handed  white female born 29-May-1950, with a history of psychogenic  deficits.  This patient has been seen on various occasions for episodes  of right-sided weakness with negative neurologic exams and generalized  weakness.  The patient did have an episode of Guillain-Barre syndrome in  December 2007, but even in the recovery, he demonstrated evidence of  psychogenic overlay.  The patient comes in today for chest pain and was  being admitted for evaluation.  The patient began having onset of  headache and numbness in the left side that began around 5:20 p.m.  today.  Code stroke was called.  The CT scan of the head was done and  shows no acute changes.  The patient has NIH Stroke Scale score of 5 but  TPA is not being given due to minimal objective deficit.  The patient  will be admitted for further stroke evaluation.   PAST MEDICAL HISTORY:  Significant for  1. History of left-sided numbness today.  2. 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  3. History of depression.  4. 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  5. Fibromyalgia.  6. Mitral valve prolapse.  7. Hypothyroidism.  8. Colectomy x2 for constipation and redundant colon.  9. Migraine headaches.  10.Appendectomy.  11.Gallbladder resection.  12.Hysterectomy.  13.Tonsillectomy.  14.Fibromyalgia.   CURRENT MEDICATIONS:  Include  1. Elavil 50 mg daily.  2. Zomig if needed.  3. Methadone for chronic pain 60 mg daily.  4. Valium 5 mg if needed.  5. Levothyroxine 0.05 mg daily.   The patient has allergies to SULFA DRUGS,  PYRIDIUM, DEPAKOTE, and  TOPAMAX.   SOCIAL HISTORY:  The patient does not smoke or drink.  This patient is  divorced, [place], has two children  one of which has traumatic brain injury and is HIV positive.  The  patient does not work.   FAMILY MEDICAL HISTORY:  Notable that mother is alive with history of  coronary artery disease.  Has a history of MI, CABG procedure.  Father  died with heart disease.  The patient has one brother with hypertension  and dyslipidemia.   REVIEW OF SYSTEMS:  Notable for some feelings of being flushed today,  chest pain today, feels she has trouble with her speech, some left-sided  numbness as above, headache all over head, nausea today.  Denies any  problems controlling bowels or bladder.  He has not any recent  blackouts.  He has had syncope in the past.   PHYSICAL EXAMINATION:  VITALS:  Blood pressure is 118/71, heart rate 72,  respiratory rate  20, temperature afebrile.  GENERAL:  This patient is a minimally obese white female who is alert  and cooperative at the time of examination.  HEENT:  Head is atraumatic.  Eyes, pupils equal, round and reactive to  light.  Disks are flat bilaterally.  NECK:  Supple.  No carotid bruits noted.  RESPIRATORY:  Clear.  CARDIOVASCULAR:  Reveals a regular rate and rhythm.  No obvious murmurs,  rubs are noted.  EXTREMITIES:  Without significant edema.  NEUROLOGIC:  Cranial nerves as above.  May be very minimal decrease in  the left nasolabial fold, symmetric smile, however seen.  Visual fields  are full.  The patient notes decreased pinprick sensation, the left face  does not split midline, vibratory sensation in the forehead.  Speech is  somewhat hesitant at times with no aphasia or dysarthria noted.  Motor  testing reveals some variable effort with the left arm and leg but no  true weakness is seen.  The patient however does demonstrate  some drift  on the left arm and left leg.  No drift seen on the right.  Deep tendon  reflexes are depressed but symmetric throughout.  Toes are neutral  bilaterally.  The patient has good finger-nose-finger and heel-to-shin  bilaterally.  Gait was not tested.  The patient notes decreased pinprick  sensation, and vibratory sensation  to left arm and left legs.  The  right extincts on the left leg as compared to the right.  No extinction  on the face or arm.   Laboratory values notable for sodium 136, potassium 3.4, chloride of  101, CO2 26, glucose of 90, BUN of 9, creatinine 0.71, calcium 8.8.  White count of 6.5, hemoglobin 11.6, hematocrit 34.1, MCV of 83.9,  platelets of 267, CK-MB fraction is 2.9, troponin-I  less than 0.05.  Chest x-ray shows no acute process, some evidence COPD.  CT of the head  is unremarkable.   IMPRESSION:  1. New onset of left-sided numbness.  2. History of psychogenic weakness on the right side in the past.  3. History of Guillain-Barre syndrome.   This patient has NIH Stroke Scale score of 5 but objectively, the  patient has very minimal symptoms.  The patient has some giveaway type  weakness on the left side, but uses the left arm very normally and in  the course of talking and pointing the things may playing things.  I  suspect that this too represents a psychogenic deficit.  Due to minimal  deficit and the history of somatoform disorder, we will not give TPA at  this point.   PLAN:  1. We will obtain MRI scan of the brain.  2. MRI angiogram of the intracranial and extracranial vessels.  There      had been some questionable left middle cerebral aneurysm in the      past.  We will follow the patient's course while in-house.  The      patient will be placed on antiplatelet agents.      Marlan Palau, M.D.  Electronically Signed     CKW/MEDQ  D:  08/23/2007  T:  08/24/2007  Job:  [id number]   cc:   [facility]  Valetta Mole.  Swords, MD

## 2010-09-06 NOTE — Discharge Summary (Signed)
NAMEMARQUERITE, [name]                 ACCOUNT NO.:  [id number]   MEDICAL RECORD NO.:  [id number]          PATIENT TYPE:  INP   LOCATION:  5156                         FACILITY:  [facility]   PHYSICIAN:  Sandford Craze, NP DATE OF BIRTH:  Aug 21, 1950   DATE OF ADMISSION:  08/23/2007  DATE OF DISCHARGE:                               DISCHARGE SUMMARY   ADDENDUM   As the patient is uninsured, case management is assisting in getting the  patient some medications from pharmacy on a need basis.  Prescriptions  has been provided for 3-day supply of all medications on discharge  summary including methadone.      Sandford Craze, NP     MO/MEDQ  D:  08/27/2007  T:  08/27/2007  Job:  [id number]
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
## 2010-09-09 ENCOUNTER — Other Ambulatory Visit: Payer: Self-pay | Admitting: Internal Medicine

## 2010-09-09 NOTE — Discharge Summary (Signed)
[facility]  Patient:    [name], [name]                        MRN: [id number] Adm. Date:  [id number] Disc. Date: 10/31/99 Attending:  Heber [place] CC:         Rosalyn Gess. Norins, M.D. [facility]             Dora M. Juanda Chance, M.D. [facility]                           Discharge Summary  HISTORY OF PRESENT ILLNESS:  [name] is a 60 year old white female with chronic pain including fibromyalgia and migraines.  She is on huge quantities of OxyContin for her chronic pain.  She presented with complaints of abdominal pain and vomiting.  Radiologic evaluation revealed colonic ileus.  She was admitted for further evaluation.  Please see the dictated History and Physical.  HOSPITAL COURSE:  GASTROINTESTINAL:  The patient was given IV fluids.  OxyContin was held overnight.  She did require restart the following day due to her pain.  She did have several bowel movements.  There was no radiographic evidence for ileus on hospital day #1.  However, the patient continued to have pain.  She underwent CT scan of the abdomen and pelvis.  There was no evidence for bowel obstruction.  There was a mild ileus pattern with evidence for partial colectomy.  The patient continued to have pain and required increase in her pain medicines back to her usual level.  She was seen in consultation by Dr. Claudette Head who agreed with the treatment plan.  On the day of discharge, the patient is still complaining of some right-sided pain.  We did discuss the fact that she would continue to have pain and that her problems with constipation would continue to be a problem as long as she required the amount of pain medication that she was on.  She will be placed on MiraLax to be used once a week as needed.  She was placed on Levsin for her cramping as well.  KUB on October 30, 1999, revealed on evidence for fecal impaction and no dilated bowel to suggest obstruction.  FINAL DIAGNOSES: 1.  Obstipation with colonic ileus. 2. Chronic narcotic use for chronic pain syndrome. 3. Fibromyalgia. 4. Migraines.  DISCHARGE MEDICATIONS: 1. MiraLax 17 g in 10 ounces of water once daily x 2 days, then once weekly    as needed. 2. Levsin 0.375 mg p.o. b.i.d. 3. OxyContin 40 mg p.o. b.i.d. 4. Elavil 200 mg q.h.s. 5. Claritin 10 mg p.o. q.d. 6. Nasonex 1 spray to each nostril q.d. 7. Magic Mouth Wash 5 cc 4 times daily as needed.  ACTIVITY:  As tolerated.  DIET:  She is to add bran cereal at least once daily.  SPECIAL INSTRUCTIONS:  Minimizing OxyContin use would help to alleviate constipation problems.  FOLLOWUP:  With Dr. Debby Bud in 10 to 14 days.  Follow up with Dr. Lina Sar as needed. DD:  10/31/99 TD:  10/31/99 Job: [id number]

## 2010-09-09 NOTE — Discharge Summary (Signed)
NAME:  [name], [name]                           ACCOUNT NO.:  [id number]   MEDICAL RECORD NO.:  [id number]                   PATIENT TYPE:  INP   LOCATION:  3731                                 FACILITY:  [facility]   PHYSICIAN:  Veneda Melter, M.D.                   DATE OF BIRTH:  27-May-1950   DATE OF ADMISSION:  09/10/2002  DATE OF DISCHARGE:  09/11/2002                                 DISCHARGE SUMMARY   DISCHARGE DIAGNOSES:  1. Chest pain, etiology unclear.  2. Questionable syncopal episode occurring three days prior to admission.  3. Possible  gastroesophageal reflux disease.  4. Fibromyalgia.  5. Chronic pain syndrome.  6. 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  7. History of mitral valve prolapse.  8. History of migraine headaches.  9. History of chronic constipation, status post colectomy for redundant     colon time two.  10.      Status post abdominal hysterectomy secondary to endometriosis.  11.      History of appendectomy.  12.      History of gait disorder, unknown etiology.   PROCEDURES PERFORMED THIS ADMISSION:  1. Cardiac catheterization by DrMarland Kitchen Veneda Melter on Sep 11, 2002 revealing     noncritical coronary artery disease, normal left ventricular function,     ejection fraction greater than 55% and no mitral regurgitation.  2. Chest computerized tomography revealed pulmonary embolism; negative.  3. Head computerized tomography without contrast; negative.   HOSPITAL COURSE:  Please see the dictated admission history and physical  from Sep 10, 2002 for complete details.   Briefly, this 60 year old female with a known history of fibromyalgia and  migraine headaches, but no known history of CAD was referred to the  emergency room at [facility] on Sep 10, 2002 for chest pain that  developed after having left ear surgery.  The ear surgery did require  general anesthesia.  The patient's EKG was some nonspecific  with T-wave  inversion in lead 3 and T-wave flattening in aVF, and poor R wave  progression.  The patient also described a syncopal episode occurring at  home after getting out of the shower.  She states she hit her head on the  bathtub.  She reported no recurrence after this episode.   The patient was admitted and placed on aspirin, Lopressor and Protonix.  Nitroglycerin had given her a migraine headache and this was held.  She was  placed on telemetry.  She subsequently ruled out for myocardial infarction  by enzymes.  Her telemetry showed no arrhythmias to account for her presumed  syncope.  The patient was originally set up for an outpatient Cardiolite and  2-D echocardiogram to evaluate her symptoms.  However, on the morning of Sep 11, 2002 she continued to complain of chest pain.  She consistently  asked  for IV morphine to help control her pain.   On rounds on Sep 11, 2002 it was decided that with her ongoing symptoms and  significant family history for coronary artery disease that she should  undergo cardiac catheterization to rule out coronary artery disease as a  cause of her symptoms.  She was agreeable to this.  She underwent cardiac  catheterization the same day by Dr. Chales Abrahams.  This revealed a normal left  main, an LAD with two diagonals and mild disease, a left circumflex with a  large OM-1 with mild disease and a small OM-2, and RCA with luminal disease.  Left ventriculogram revealed an EF greater than 55% and no MR.  The patient  also had a mildly elevated D-dimer at 0.59.  She was sent for a chest CT The  patient rule out pulmonary embolism and this was reportedly negative.  The  patient was also sent for head CT without contrast secondary to her history  of a fall and trauma to her head.  This was also reportedly negative.  Therefore, it was felt the patient was stable enough for discharge to home.  She would need follow up as an outpatient for a reported history of  syncope.  It was recommended she do no driving at this time until she follows up with  her primary care physician. We will set her up in our office for an event  monitor.   At the time of this dictation the patient is in the process of completing  her post catheterization bedrest.  Once this is complete she will be  discharged to home as long as she is stable after ambulating.   The patient was visited by Dr. Dorma Russell, the ear, nose and throat surgeon, who  performed her left ear surgery on May 19th.  He placed her on Augmentin for  chondritis of the left ear.   The patient did note increasing symptoms of indigestion and chest burning,  especially after eating.  She will be sent home on proton pump inhibitor  therapy empirically.  She will rhe follow up with her primary care physician  for this issue.   LABORATORY DATA:  Admission chest x-ray was negative.  Sodium 140, potassium  3.8, chloride 105, CO2 29, glucose 123, BUN 7, and creatinine 0.7.  Total  bilirubin 0.6, alkaline phosphatase 64, AST 29, ALT 18, total protein 2.2,  albumin 3.8, and calcium 9.  Cardiac enzymes were negative times three.  Total cholesterol 142, triglycerides 33, HDL 68 and LDL 67.  White count  4,500, hemoglobin 11.3, hematocrit 33.1, platelet count 196,000, and MCV 87.  TSH 0.768.  INR on admission 0.9.  D-dimer 0.59.  No pending tests at the  time of this dictation.   DISCHARGE MEDICATIONS:  1. Protonix 40 mg daily.  2. Amitriptyline 100 mg q.h.s.  3. Lexapro.  4. Zomig 5 mg p.o. p.r.n.  5. Phenergan p.r.n.  6. Diazepam 5 mg p.r.n.  7. Augmentin 1 gram bid as prescribed by Dr. Dorma Russell.   PAIN MANAGEMENT:  Tylenol as needed for pain.   ACTIVITY:  No driving for now.  No heavy lifting, exertion, working or sex  for two days.  She is to slowly advance as tolerated.   DIET:  Low-fat, low-sodium.  WOUND CARE:  The patient should call our office in [facility] for any groin  swelling, bleeding or  bruising.   SPECIAL INSTRUCTIONS:  The patient is to perform no automobile driving  at  this time.  She is to await the results of her event monitor and then  further discuss her symptoms of potential syncope that occurred three days  prior to admission with her primary care physician.    FOLLOW UP:  The patient will be contacted by our office to be set up for an  event monitor.  She will follow up with Dr. Dorma Russell as directed.  She should  seek follow up with Dr. Debby Bud next week and she should call him for an  appointment.       Tereso Newcomer, P.A.                        Veneda Melter, M.D.    SW/MEDQ  D:  09/11/2002  T:  09/11/2002  Job:  [id number]   cc:   Rosalyn Gess. Norins, M.D. [facility]   Carolan Shiver, M.D.  [address]  [place]  [place] [address]  Fax: [phone]
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
## 2010-09-09 NOTE — Assessment & Plan Note (Signed)
[facility]                                 ON-CALL NOTE   RONELLE, MICHIE                        MRN:          [id number]  DATE:04/13/2006                            DOB:          [birth year]    Phone number [phone].  Primary is Dr. Jonny Ruiz.   SUBJECTIVE:  Initial call from [facility] at [facility] regarding a set  of 2 prescriptions supposedly written by Dr. Jonny Ruiz earlier in the day,  not sent in via [facility].  The prescriptions were abx as well as  Fiorinal with codeine.  The patient was contacted at home, stated that  she recently was discharged from the hospital with Guillain-Barre.  She  also has a history of fibromyalgia and is on methadone.  At discharge  from the hospital or previously by Dr. Jonny Ruiz she was given Vicodin, but  it is making her nauseous for breakthrough pain.  She in the past had  been on Fiorinal with codeine, and states that Dr. Jonny Ruiz had called her  in via [facility] a prescription for this medicine.  In addition she  was seen and diagnosed with a urinary tract infection likely from  catheterization in the hospital.  She is not sure what antibiotic was  given, but she is ALLERGIC TO SEPTRA and denies fever and CVA  tenderness.   ASSESSMENT AND PLAN:  The prescription for Cipro 250 mg p.o. b.i.d. x3  days called in.  Discussed pain medication with patient.  Given that  there is a problem with our new electronic prescription processing I  will give her a temporary supply of Fiorinal to last through the  weekend, until the office is open on Monday.  Fiorinal with codeine was  called, 1 tablet p.o. q. 6 hours p.r.n. pain, #15, zero refills.     Kerby Nora, MD  Electronically Signed    AB/MedQ  DD: 04/13/2006  DT: 04/15/2006  Job #: [id number]
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
## 2010-09-09 NOTE — Cardiovascular Report (Signed)
NAME:  [name], [name]                           ACCOUNT NO.:  [id number]   MEDICAL RECORD NO.:  [id number]                   PATIENT TYPE:  INP   LOCATION:  3731                                 FACILITY:  [facility]   PHYSICIAN:  Veneda Melter, M.D.                   DATE OF BIRTH:  23-Dec-1950   DATE OF PROCEDURE:  09/11/2002  DATE OF DISCHARGE:                              CARDIAC CATHETERIZATION   PROCEDURE PERFORMED:   CARDIOLOGIST:  Veneda Melter, M.D.   HISTORY:  The patient is a 60 year old white female with fibromyalgia who  recently underwent left ear surgery.  Postoperatively the patient developed  substernal chest discomfort and was referred to [facility] emergency room.  She  was admitted and ruled out for acute myocardial infarction overnight;  though, she persisted in having chest discomfort and had nonspecific ECG  changes.  She is referred for further cardiac assessment.   TECHNIQUE:  Informed consent was obtained.  The patient was brought to the  catheterization lab.  A 6 French sheath was placed in the right femoral  artery using the modified Seldinger technique.  Six [place] JL-4 and JR-4  catheters were then used to engage the left and right coronary arteries.  Selective angiography was performed in various projections using manual  injection of contrast.  A 6 French pigtail catheter was advanced in the left  ventricle and left ventriculogram performed using power injection of  contrast.   At the termination of the case catheters and sheath were removed, and manual  pressure was applied until adequate hemostasis was achieved.   The patient tolerated the procedure well and was transferred to the floor in  stable condition.   FINDINGS:  1. Left Main Trunk:  Normal.   1. Left Anterior Descending:  LAD is a medium caliber vessel that provides     two small diagonal branches in the midsection.  The LAD has mild     irregularities.   1. Left Circumflex Artery:  The left  circumflex is a medium caliber vessel     that supplies a larger bifurcating first marginal branch in the proximal     segment and a second marginal branch distally.  The left circumflex     system has luminal irregularities.   1. Right Coronary Artery:  The right coronary artery is dominant.  This is a     medium caliber vessel that supplies the posterior descending artery in     its terminal segment.  The right coronary artery has luminal disease.   1. Left Ventricle:  Normal end-systolic and end-diastolic dimensions.     Overall left ventricular function is well-preserved.  Ejection fraction     greater than 55%.  No mitral regurgitation.  LV pressure 120/5, aortic is     120/70 and LVEDP is 15.    ASSESSMENT/PLAN:  The patient is a 60 year old female  who presents with  substernal chest discomfort of noncardiac nature.   Continued medical therapy will be pursued and other causes of chest pain  investigated.                                                 Veneda Melter, M.D.    NG/MEDQ  D:  09/11/2002  T:  09/11/2002  Job:  [id number]   cc:   Rosalyn Gess. Norins, M.D. [facility]

## 2010-09-09 NOTE — H&P (Signed)
[place]. [facility]  Patient:    [name], [name]                         MRN: [id number] Adm. Date:  10/26/99 Attending:  Pricilla Riffle. Trisha Mangle, M.D. [facility] CC:         Rosalyn Gess. Norins, M.D. [facility]                         History and Physical  HISTORY OF PRESENT ILLNESS:  [name] is a 60 year old white female patient of Dr. Illene Regulus who presents tonight with worsening abdominal pain in the past two weeks.  She states that the pain is so severe it is just like labor.  She says that it can occur at 2 minute intervals.  She is taking Motrin, has been walking, trying to pass gas to relieve her symptoms.  The last bowel movement was four days ago.  She states she usually has a bowel movement every two weeks.  She had vomiting on multiple occasions yesterday, including today.  She also had associated nausea.  The patient also had a migraine this morning, for which she took Zomig.   The increased severity of her pain prompted her ER evaluation.  PAST MEDICAL HISTORY:  Migraines, including complex migraine in the past.  She had underlying fibromyalgia, chronic back pain with degenerative joint disease, mitral valve prolapse.  She is followed at the [facility]. She is status post hysterectomy for endometriosis.  She is status post tonsillectomy and appendectomy.  She has also undergone sigmoid colon resection for redundancy in the sigmoid colon in 1991 by Dr. Wenda Low.  ALLERGIES AND INTOLERANCES: 1. SULFA. 2. PERIDIUM.  CURRENT MEDICATIONS: 1. Oxycodone 40 mg p.o. b.i.d. 2. Zomig 5 mg p.o. p.r.n. migraine headache. 3. Elavil 200 mg p.o. q.h.s. 4. Valium p.r.n. 5. Claritin 10 mg p.o. q.d. 6. Nasonex p.r.n.  SOCIAL HISTORY:  She is divorced for the last three months.  She has one son and one daughter.  She is a nonsmoker and does drink alcohol.  She laughs when she talks about the amount of alcohol that she drinks.  FAMILY HISTORY:   Significant for coronary artery disease, as well as alcoholism.  REVIEW OF SYSTEMS:  As above and other systems are negative.  PHYSICAL EXAMINATION:  VITAL SIGNS:  Temperature 98.7, blood pressure 135/78, pulse 89, respiratory rate 20.  GENERAL:  She is a well-appearing white female.  HEENT:  Pupils are equal, round and reactive to light.  Sclerae without icterus.  Conjunctivae are clear.  NECK:  Supple.  Oropharynx is clear.  Thyroid is normal size and nontender. There is no adenopathy.  LUNGS:  Clear to auscultation.  CARDIAC:  Regular rate and rhythm without appreciable murmur.  ABDOMEN:  Hypoactive bowel sounds.  There is no rebound.  There is moderate diffuse tenderness.  There are no masses.  Her naval is very deep with what appears to be a scab versus a small umbilical hernia.  EXTREMITIES:  No edema.  There is no cyanosis.  NEUROLOGIC:  She is alert and somewhat anxious.  She is cooperative.  Strength is normal.  Reflexes are symmetrical.  SKIN:  No rashes.  LABORATORY DATA:  Normal white count of 8900, hemoglobin 12.2.  Electrolytes within normal limits.  LFTs within normal limits.  Amylase and lipase were normal.  Urinalysis is negative with the exception of the microscopic,  which shows 6-10 WBCs.  Abdominal films reveal constipation and colonic ileus.  ASSESSMENT AND PLAN: 1. Chronic pain syndrome with known migraines, fibromyalgia, and chronic narcotic pain medications.  The patients abdominal pain is now increased and signs are consistent with a colonic ileus, as well as constipation.  She will be admitted for hydration and limited pain medications.  Abdominal films will be obtained in the morning.  Also obtain I&O catheter to rule out UTI, etiology of her lower abdominal pain.  2. Have surgery look at her naval in the morning, as Dr. Daphine Deutscher plans to come by.  It is unclear to me whether this is a small umbilical hernia that needs further  evaluation. DD:  10/26/99 TD:  10/26/99 Job: [id number]

## 2010-09-09 NOTE — Discharge Summary (Signed)
[place]. [facility]  Patient:    [name], [name]                        MRN: [id number] Adm. Date:  [id number] Disc. Date: [id number] Attending:  Katha Cabal                           Discharge Summary  ADMISSION DIAGNOSIS:  Fibromyalgia with chronic constipation with delayed colonic transit by markers.  DISCHARGE DIAGNOSIS:  Fibromyalgia with chronic constipation with delayed colonic transit by markers.  PROCEDURES:  On January 23, 2000, subtotal colectomy with ileosigmoidostomy.  HOSPITAL COURSE:  [name] is a 60 year old lady who underwent the above-mentioned operation.  She had pain control issues requiring oxycodone, as well as morphine.  They were unable to get an epidural to work.  She progressed somewhat slowly.  Her staples were removed and she was ready for discharge on February 01, 2000.  She was afebrile, having had bowel movements, and the wound looked good.  The pathology report showed focal fibrosis of the muscularis with disruption of the musculature layer and hypertrophy of the nerve fibers suggesting a neural based motility disorder. DD:  02/28/00 TD:  02/29/00 Job: [id number]
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
## 2010-09-09 NOTE — Op Note (Signed)
[facility]  Patient:    [name], [name]                        MRN: [id number] Proc. Date: 01/23/00 Adm. Date:  [id number] Attending:  Katha Cabal CC:         Hedwig Morton. Juanda Chance, M.D. [facility]   Operative Report  PREOPERATIVE DIAGNOSES: 1. Chronic obstipation. 2. Fibromyalgia with recurrent migraine headaches.  POSTOPERATIVE DIAGNOSES: 1. Chronic obstipation. 2. Fibromyalgia and recurrent migraine headaches.  OPERATION:  Subtotal colectomy with ileosigmoid anastomosis.  SURGEON:  Thornton Park. Daphine Deutscher, M.D.  ASSISTANT:  Zigmund Daniel, M.D.  ANESTHESIA:  General endotracheal with postoperative epidural.  DESCRIPTION OF PROCEDURE:   [name] is a 60 year old lady who after informed consent had been taken regarding colectomy for this problem, was brought to OR 9 on January 23, 2000 and after general anesthesia was administered, the abdomen was prepped with Betadine and draped sterilely.  I excised her old scar.  I went down the midline and incised the fascia and opened and entered the abdomen without difficulty.  She had some filmy adhesions about the old midline incision where she had had her previous partial colectomy for chronic constipation. I entered the abdomen and then found her previous anastomosis with the mid sigmoid and I had previously mobilized her splenic flexure and found fairly redundant twisted transverse colon and I did have to mobilize her right colon.  I identified her cecum which was somewhat floppy, mobilized it out of the pelvis and mobilized the right colon using Bovie.  This was brought up into the wound and I then divided it proximally in the terminal ileum with a GIA and distally just distal to the previous anastomosis.  I then went through the mesentery with a combination of Kelly clamps with 2-0 silk ties as well as the harmonic scalpel and divided the mesentery all the way around. I inspected the mesentery  and there was no bleeding seen.  I then fashioned functional end to end anastomosis using the GIA stapling the two antimesenteric borders of the terminal ileum to the sigmoid colon.  The common defect was closed with a single application of the TA 55.  The anastomosis looked good and the colon appeared healthy and viable.  Mesenteric defect was closed with several interrupted 4-0 silks and then the small bowel was traced backward from the terminal ileum.  Prior to doing this; however, I did place a crotch stitch down distally so that the anastomosis would not get kinked.  I then ran the small bowel back to the ligament of Treitz. She had some very thin mesentery and the small bowel caliber looked unremarkable.  No bleeding was seen.  I irrigated with saline. I changed my gloves.  I then closed the lower portion of the wound with running 2-0 Vicryl and then closed the fascia with a running #1 PDS.  The area was irrigated.  The skin was approximated with vertical mattress sutures of 3-0 nylon and staples.  The patient seemed to tolerate the procedure well and was taken to the recovery room in satisfactory condition. An epidural was placed.  Because of her chronic pain problems anesthesia consult was obtained and we decided to put in an epidural and to start her on oral OxyContin postoperatively. DD:  01/23/00 TD:  01/24/00 Job: [id number]
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
## 2010-09-09 NOTE — Procedures (Signed)
[facility]  Patient:    [name], PROVENCAL                        MRN: [id number] Adm. Date:  [id number] Disc. Date: [id number] Attending:  Mervin Hack CC:         Thornton Park. Daphine Deutscher, M.D.             Rosalyn Gess Norins, M.D. [facility]             [employer] [place], [place]             [facility] #[phone]                           Procedure Report  PROCEDURE:  Colonoscopy.  INDICATIONS FOR PROCEDURE:  This 60 year old white female has had chronic obstipation and pseudo obstruction initially evaluated approximately 12 years ago.  She has required segmental colon resection for hypomotility of her colon and did okay for a while, but then never really resumed normal bowel habits. She has been diagnosed with fibromyalgia and has been on OxyContin for ______ generalized pain.  She has been on multiple laxatives, but has not been able to evacuate stool to the point that she gets severely distended.  She also has nausea and vomiting as a result of her constipation.  She was evaluated in the office on November 04, 1999 and confirmed abdominal distention with possible pseudo obstruction status.  She has been on Miralax 17 g b.i.d. with only poor results.  She is now undergoing colonoscopy to assess the generalized colonic anatomy.  ENDOSCOPE:  Olympus single channel video endoscope.  SEDATION:  Versed 12 mg IV, fentanyl 125 mcg IV.  FINDINGS:  The Olympus single channel videoscope was passed under direct vision from the rectum through the sigmoid colon.  The patient was monitored by pulse oximeter.  Her oxygen saturations were normal.  The patient remained alert through the procedure despite a large amount of pain medications.  The anal canal and rectal ampulla were normal.  There was no evidence of dilatation in the rectum.  The entire colon was rather mildly dilated.  There was no evidence of spasm or diverticulosis.   There was retained stool throughout the entire colon despite the standard laxative regimen which included one gallon of GoLYTELY and one Fleets Phospho-soda 3 oz solution. The mucosa of the colon appeared normal.  There was no acute inflammation. There was only mild melanosis coli throughout the colon.  The hepatic flexure, transverse colon and right colon mucosa were normal, but the lumen was somewhat dilated, indicating chronic laxative use.  The cecal pouch was also large with a normal appearing ileocecal valve.  The colonoscope was then retracted and the colon decompressed.  The patient tolerated the procedure well.  IMPRESSION: 1. 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 2. Mild melanosis coli. 3. Status post remote segmental resection of the colon with no visible    anastomotic site.  PLAN: 1. Small bowel follow through to assess the motility of the small bowel. 2. Increase Miralax to 17 g t.i.d. 3. Discuss decreasing her OxyContin with Dr. Judeth Cornfield in the pain clinic. 4. The patient has an appointment with Dr. Luretha Murphy to assess for    additional colon resection. DD:  11/18/99 TD:  11/20/99 Job: [id number]

## 2010-09-09 NOTE — H&P (Signed)
NAME:  Virginia, [name]                           ACCOUNT NO.:  [id number]   MEDICAL RECORD NO.:  [id number]                   PATIENT TYPE:  INP   LOCATION:  1827                                 FACILITY:  [facility]   PHYSICIAN:  Veneda Melter, M.D.                   DATE OF BIRTH:  01/29/1951   DATE OF ADMISSION:  09/10/2002  DATE OF DISCHARGE:                                HISTORY & PHYSICAL   PRIMARY CARDIOLOGIST:  Unknown.   PRIMARY CARE PHYSICIAN:  Rosalyn Gess. Norins, M.D. [facility].   CHIEF COMPLAINT:  Chest pain.   HISTORY OF PRESENT ILLNESS:  This 60 year old divorced white female is  referred to the [facility] emergency room today by Carolan Shiver,  M.D. for chest pain developing after left ear surgery today.  She received  general anesthesia today.  The patient awoke with substernal chest pain and  difficulty breathing.  She denies any true shortness of breath.  Her pain  was unlike any of her previous pain with fibromyalgia.  She denied any  diaphoresis, but has had episodic diaphoresis at home recently. She has  chronic nausea.  Of note, she has had mold discovered in her home.  Over the  last several weeks, she has had a multitude of symptoms that she contributes  to the discovered mold.  Some of her symptoms have included chest pain,  shortness of breath, temperature of 102 degrees Fahrenheit.  Her chest pain  at home has been occurring at rest as well as with exertion.  It has been  sharp and has been exacerbated with palpation of her chest.  She has noted  some jaw pain recently as well.  She does note sleeping on four pillows.  If  she takes one pillow, she does develop worsening chest pain.  She does  describe what sounds like true paroxysmal nocturnal dyspnea where she  awakens in the middle of the night with shortness of breath that takes  approximately 30 minutes to resolve.  She does note one episode of possible  syncope that occurred last week while getting out  of the shower. She said  she hit her head on the tub when she fell.  Today, she is referred over from  the [facility] for further evaluation of her chest pain.  Her EKG in the emergency room reveals T wave inversion in lead 3 and T wave  flattening in aVF. This is unchanged from a previous tracing in May of 2003.  She does have poor R wave progression.  Initial enzymes are still pending.   PAST MEDICAL HISTORY:  1. Depression.  2. Chronic left auricular chondrodermatitis nodularis.  3. History of mitral valve prolapse.  4. History of migraine headaches.  5. Fibromyalgia.  6. Chronic pain syndrome.  7. Chronic constipation - status post colectomy for redundant colon x2.  8.  Status post total abdominal hysterectomy secondary to endometriosis.  9. History of appendectomy.  10.      History of gait disorder - unknown etiology.   She denies any history of diabetes, hypertension, or thyroid disease.  Her  lipid status is unknown.   ALLERGIES:  SULFA, PYRIDIUM.   MEDICATIONS:  1. OxyContin 40 mg t.i.d.  2. Amitriptyline 100 mg q.h.s.  3. Lexapro daily.  4. Zomig 5 mg p.r.n. headache.  5. Phenergan p.r.n.  6. Diazepam 5 mg p.r.n.   SOCIAL HISTORY:  The patient lives in [place] by herself. She is divorced  with two adult children. Currently she is disabled.  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  She denies any history of tobacco use.  She  denies any alcohol or drug use.   FAMILY HISTORY:  Significant for coronary artery disease.  Her mother is  still alive, but did have an MI at the age of 105. She is status post CABG.  Her father is deceased. He had his first MI at age 67.   REVIEW OF SYSTEMS:  CONSTITUTIONAL:  Positive fevers, sweats.  HEENT:  Positive headaches.  SKIN: No rashes.  CARDIOPULMONARY: Positive chest pain,  shortness of breath, dyspnea with exertion. She does describe this four-  pillow elevation of her head.  If she takes one pillow  away she develops  chest pain. She does describe what sounds like paroxysmal nocturnal dyspnea  as noted above in the HPI.  Positive syncope (?) - as noted in the HPI.  GENITOURINARY:  No frequency, urgency, dysuria, or hematuria.  NEUROPSYCHE:  Positive weakness.  MUSCULOSKELETAL:  Positive myalgias and arthralgias.  GASTROINTESTINAL:  Positive nausea, no vomiting, diarrhea, bright red blood  per rectum, melena.  She does describe increased indigestion.  ENDOCRINE: No  skin or hair changes.  The rest of the review of systems are otherwise  negative.   PHYSICAL EXAMINATION:  VITAL SIGNS:  Temperature 98 degrees Fahrenheit,  pulse 81, respirations 16, blood pressure 121/72.  Oxygen saturation 96% on  room air.  GENERAL:  A well-developed, well-nourished female in no acute distress.  She  is sitting up on the edge of the bed.  She says to me as I walk in the room,  I want to go home!  HEENT:  Normocephalic and atraumatic, left ear dressing intact.  NECK:  Supple without lymphadenopathy or thyromegaly, bruit, or JVD.  HEMATOPOIETIC:  No lymphadenopathy noted.  HEART:  Regular rate and rhythm, normal S1 and S2. There is a 1/6 systolic  ejection murmur over the left sternal border.  Peripheral pulses are 2+ and  equal without bruits.  LUNGS:  Clear to auscultation bilaterally.  SKIN:  No rashes.  ABDOMEN:  Soft and nontender with normal active bowel sounds, no rebound, no  guarding, and no hepatosplenomegaly.  EXTREMITIES:  No cyanosis, clubbing, or edema.  MUSCULOSKELETAL:  No joint deformity. No spine or CVA tenderness.  NEUROLOGY:  Grossly intact.   Chest x-ray; no acute disease.  EKG; rate 69, normal sinus rhythm, normal  axis, poor R wave progression, T wave inversion in lead 3 and T wave  flattening in aVF. This tracing is unchanged from Sep 11, 2001.   LABORATORY DATA:  White count 4500, hemoglobin 11.3, hematocrit 33.1, platelet count 196,000. Sodium 140, potassium 3.9, chloride  102, BUN 8,  creatinine 1, glucose 187.  CK-MB and troponin I pending.   IMPRESSION:  1. Atypical chest pain.  2. Migraine headaches.  3. Fibromyalgia.  4. History of mitral valve prolapse.  5. Probable syncopal episode with trauma to head secondary to fall.  6. Gastroesophageal reflux disease.  7. Chronic constipation, status post colectomy.  8. Status post left ear surgery.  9. Depression.  10.      Questionable fevers.   PLAN:  The patient was seen with Dr. Chales Abrahams today, who formulated the  following plan.  The patient will be admitted.  Nitroglycerin will not be  started as the patient has developed a headache with this.  Her symptoms are  not convincing enough to start anticoagulation.  If her enzymes are  positive, we will start her on Lovenox. For now, we will treat her with  aspirin and Lopressor 12.5 mg b.i.d.  We will also place her on Protonix 40  mg daily for her increased symptoms of indigestion.  We will check serial  cardiac enzymes as well as TSH and fasting lipid panel. She will be watched  on telemetry to rule out arrhythmias as a cause for her probable syncope. If  there are no rhythm abnormalities noted on telemetry, then the patient will  probably need to be sent home with an event monitor.  She has been advised  to do no driving for now until the cause of her syncope has been discovered.  She will also be sent today for a head CT without contrast to rule out bleed  as she reports hitting her head on the side of her tub when she had a  syncopal episode last week.  If she rules out for MI by enzymes, she will  more than likely be discharged to home tomorrow.  She has already been set  up in our office for a stress Cardiolite and two-dimensional echocardiogram  on Sep 12, 2002.  The Cardiolite is scheduled for 8:45 a.m. and the echo is  scheduled for 1:30 p.m.  If her enzymes are positive or if she continues  with intractable pain, then we may need to consider  cardiac catheterization  to definitively rule out coronary artery disease as a cause for her pain.  Of note, she is requesting additional pain medication at this time as she  did miss her usual afternoon dose of OxyContin today. She does have a lot of  ear pain at present secondary to her recent surgery.     Tereso Newcomer, P.A.                        Veneda Melter, M.D.    SW/MEDQ  D:  09/10/2002  T:  09/10/2002  Job:  [id number]   cc:   Rosalyn Gess. Norins, M.D. [facility]   Carolan Shiver, M.D.  [address]  [place]  [place] [address]  Fax: [phone]

## 2010-09-09 NOTE — Discharge Summary (Signed)
[name], [name]                 ACCOUNT NO.:  [id number]   MEDICAL RECORD NO.:  [id number]          PATIENT TYPE:  INP   LOCATION:  0469                         FACILITY:  [facility]   PHYSICIAN:  Thornton Park. Daphine Deutscher, M.D.DATE OF BIRTH:  02-07-1951   DATE OF ADMISSION:  01/03/2004  DATE OF DISCHARGE:  01/19/2004                                 DISCHARGE SUMMARY   ADMISSION DIAGNOSIS:  Recurrent abdominal pain.   PROCEDURES:  1.  Upper endoscopy or colonoscopy negative to the ileorectosigmoid      anastomosis.  2.  Abdominal ultrasound thought possibly to have small stones  3.  HIDA scan, slightly delayed emptying.  4.  Laparotomy.  5.  Enterolysis.  6.  Open cholecystectomy.  7.  Intraoperative cholangiogram which revealed a slightly dilated common      bile duct.   HOSPITAL COURSE:  [name] is a 60 year old lady with chronic problems  with defecation and probably pelvic floor dysfunction leading to same who  presented with upper abdominal pain.  After the above mentioned 0workup she  underwent laparotomy and cholecystectomy.  It seemed that her pain was  resolved.  Her path report showed chronic cholecystitis, but no evidence of  stones.  She was ready for discharge on postoperative day #7, at which time  her staples were removed and Steri-strips were applied.  Her wound was  healing nicely.  A prescription for Percocet 7.5/325 (30) were given.   CONDITION ON DISCHARGE:  Stable.   FOLLOW UP:  She was asked to return to the office in three weeks for follow-  up.      MBM/MEDQ  D:  01/19/2004  T:  01/19/2004  Job:  [id number]   cc:   Lina Sar, M.D. [facility]   Rosalyn Gess. Norins, M.D. [facility]

## 2010-09-09 NOTE — Discharge Summary (Signed)
[name], [name]                 ACCOUNT NO.:  [id number]   MEDICAL RECORD NO.:  [id number]          PATIENT TYPE:  INP   LOCATION:  3029                         FACILITY:  [facility]   PHYSICIAN:  Vikki Ports A. Felicity Coyer, MDDATE OF BIRTH:  05-14-1950   DATE OF ADMISSION:  04/02/2006  DATE OF DISCHARGE:                               DISCHARGE SUMMARY   ADDENDUM:  The patient was noted to have a low-grade temperature on April 10, 2006, which was resolving.  There is no clear source of infection. We  will send a urine culture prior to discharge.  She is instructed to call  Dr. Jonny Ruiz should she develop fever over 101.  She will need followup of  these results at her appointment with Dr. Jonny Ruiz on Friday, April 13, 2006.      Sandford Craze, NP      Raenette Rover. Felicity Coyer, MD  Electronically Signed    MO/MEDQ  D:  04/11/2006  T:  04/11/2006  Job:  [id number]

## 2010-09-09 NOTE — Consult Note (Signed)
[name], [name] [name]                 ACCOUNT NO.:  [id number]   MEDICAL RECORD NO.:  [id number]          PATIENT TYPE:  INP   LOCATION:  1826                         FACILITY:  [facility]   PHYSICIAN:  Marlan Palau, M.D.  DATE OF BIRTH:  01/24/1951   DATE OF CONSULTATION:  04/02/2006  DATE OF DISCHARGE:                                 CONSULTATION   HISTORY OF PRESENT ILLNESS:  [name] [name] is a 60 year old right-  handed white female born 1951-01-07 with a history of somatoform  disorder.  She has been seen by Dr. Kelli Hope in the past for  various sensory complaints and for an episode of right-sided weakness  that was felt to be psychogenic in April of2007.  The patient has  returned to the [facility] emergency room today after complaining of  generalized weakness for 2-3 weeks prior to this evaluation, and onset  of some numbness and lower extremity weakness in the last 3 days.  The  patient claims the numbness began in the toes and has worked up to the  knees, and the patient is now weak in both legs.  The patient denies  problems controlling the bowels or the bladder.  Denies any symptoms of  the arms.  The patient has had several falls recently.  The patient fell  about 5 weeks ago and hurt her left ankle; 2-1/2 weeks ago she fell  again and has hurt her low back, which has been persistently tender  around the waist area.  The patient has undergone MRI studies of the  cervical and thoracic spines that have been unremarkable.  The patient  is being brought in for further evaluation at this time.  Neurology is  asked to see this patient for further evaluation.   PAST MEDICAL HISTORY:  1. New onset numbness and weakness in the legs, rule out psychogenic      event versus Guillain-Barre syndrome.  2. 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  3. Fibromyalgia, chronic pain syndrome.  4. Mitral valve prolapse.  5. Colectomy x2 for constipation and redundant colon.  6.  History of hypothyroidism.  7. History of depression, followed by Dr. Evelene Croon.  8. History of migraine headaches.  9. Appendectomy.  10.Gallbladder resection.  11.Hysterectomy.  12.Tonsillectomy.   MEDICATIONS:  1. Synthroid 0.05 mg daily.  2. Reglan p.r.n.  3. Amitriptyline 100 mg at night.  4. Zomig 5 mg as needed for headache.   ALLERGIES:  The patient states an allergy to:  1. SULFA DRUGS.  2. TYLENOL.  3. PYRIDIUM.   SOCIAL HISTORY:  Does not smoke or drink.  This patient is divorced and  lives in the [place] area.  Has 2 children; one  daughter is bipolar; son has a history of a motor vehicle accident with  traumatic brain injury and is HIV positive.  The patient works as a  Adult nurse, but has not been able to work in  the last several weeks.   FAMILY HISTORY:  Notable that mother is alive with heart disease and  CABG procedure.  Father died with alcoholism.  Patient's one brother has  a history of hernia problem and heart disease.  The patient claims that  there are multiple family members with MS, as well.   REVIEW OF SYSTEMS:  Notable for no recent fevers.  The patient had some  chills last over the last 4 days.  Does note a history of headache,  particular severe the last 3 days, blurred vision, slurred speech.  Does  note some neck stiffness.  Does note some shortness of breath and chest  pain, nausea and vomiting.  Claims her bladder is working fairly well.  Has had some diarrhea recently.  Notes some dizziness.   PHYSICAL EXAMINATION:  VITALS:  Blood pressure is 124/80, heart rate  101, respiratory rate 18, temperature afebrile.  GENERAL:  This patient is a fairly well-developed white female who is  alert, cooperative at the time examination, in no apparent distress.  HEENT:  Head is atraumatic.  Eyes - pupils are equal, round and reactive  to light.  Disks are flat bilaterally.  NECK:  Supple.  No carotid bruits  noted.  RESPIRATORY:  Clear.  CARDIOVASCULAR:  Regular rate and rhythm.  No obvious murmurs or rubs  noted.  EXTREMITIES:  All are without significant edema.  NEUROLOGIC:  Cranial nerves as above.  Facial symmetry is present.  Patient has good sensation of face to pinprick, soft touch bilaterally.  Has good strength to facial muscles and the muscles of shoulder shrug  bilaterally.  Speech is well enunciated, not aphasic.  Extraocular  movements are full.  Visual fields are full.  Speech is, again, not  aphasic.  Motor and sensory reveals 5/5 strength on the  upper  extremities.  The patient has poor motor effort diffusely throughout the  lower extremities.  The patient will not push down into the hand with  gravity when the leg is raised up.  The patient is able to perform  finger-nose-finger, both upper extremities. She was not ambulated.  No  drift was seen with upper extremities.  The patient could not perform  heel-to-shin with the legs.  Deep tendon reflexes are symmetric,  slightly depressed in the arms and legs.  There are reflexes of the  knees, trace reflexes of the ankle on the right greater than left side.  The toes are neutral bilaterally.  No definite sensory level is noted.  The patient reports anesthesia to pinprick up to the knees.  No  vibratory sensation at the feet; present at the knees.  Pinprick, soft  touch, vibratory sensation of her arms is normal.  The patient has no  true sensory level, but has an area of hypesthesia in waist level of the  low back, right greater than left.   LABORATORY VALUES:  Sodium of 138, potassium 4.4, chloride of 108, BUN  of 12, glucose of 110, hematocrit 43, hemoglobin 14.6, white count of  7.9, hemoglobin of 13.3, hematocrit of 39.8, MCV 84.7, platelets of 298.  Urinalysis reveals specific of 1.026, pH of 5.5, 0-2 red cells, 0-2  white cells.   MRI STUDIES:  As above.   IMPRESSION: 1. New onset of numbness and weakness in the  lower extremities, rule      out psychogenic cause versus Guillain-Barre syndrome.  2. History of somatoform disorder with psychogenic right hemiparesis      in the past.  3. Fibromyalgia.  4. History of depression followed by Dr. Evelene Croon.   This patient has  in the past had episodes of neurologic dysfunction that  were felt to be psychogenic.  The patient's examination today suggests  poor effort with motor strength in the lower extremities.  The patient  certainly could have psychogenic deficit at this point.  However, the  patient's history is fully consistent with Guillain-Barre syndrome and  will pursue further workup at this point and treat accordingly.   PLAN:  1. The patient will be admitted to [facility].  2. Will get lumbar puncture.  3. Blood work and urine studies.  4. Consider nerve conduction studies.  5. Physical therapy and occupational therapy evaluation.      Marlan Palau, M.D.  Electronically Signed     CKW/MEDQ  D:  04/02/2006  T:  04/02/2006  Job:  [id number]   cc:   Milagros Evener, M.D.  Corwin Levins, MD

## 2010-09-09 NOTE — Discharge Summary (Signed)
[place]  Patient:    [name], [name] Visit Number: [id number] MRN: [id number]          Service Type: MED Location: 3000 [id number] Attending Physician:  Duke Salvia Dictated by:   Janora Norlander, N.P. Admit Date:  09/11/2001 Discharge Date: 09/16/2001   CC:         Dr. Danie Chandler, [facility] head of neurology   Discharge Summary  ADMISSION DIAGNOSES: 1. Intractable migrain headaches. 2. Low grade fever. 3. Diffuse myalgias. 4. Arthralgias.  PRIMARY M.D.:  Rosalyn Gess. Norins, M.D. [facility]  PROCEDURE:  MRI brain Sep 14, 2001; chronic white matter infarcts bilaterally which could be due to small vessel ischemic change.  Migraine headaches could also cause this appearance.  No acute infarct is identified.  There is some mucosal thickening in the right posterior ethmoid sinus.  On May 21, acute abdomen and chest x-ray showed no acute abnormality, negative for obstruction. Chest x-ray showed apical emphysema and pleural and parenchymal scarring changes bilaterally.  UA on May 21, was normal.  Lipase on May 21 low at 20.  BMET on May 21, normal.  CBC on May 21, normal.  Amylase on May 21, 85 normal.  CK-MB on May 21 normal.  ALLERGIES:  SULFA, PYRIDIUM.  MEDICATIONS: 1. Oxycontin 20 mg one p.o. t.i.d. 2. Amitriptyline 100 mg q.h.s. 3. Zomeg. 4. Phenergan. 5. Valium p.r.n.  She sees Dr. Ether Griffins at the [facility] and she sees Dr. Evelene Croon for psychiatric issues.  HISTORY OF PRESENT ILLNESS:  [name] was admitted on May 21 after stating that she had had a migraine headache for 8 days.  She also stated that she had postprandial abdominal pain, complained of nausea and vomiting two episodes of emesis on May 20 and May 21.  She also reported that she was unable to use the left side of her body, that she had left-sided numbness and muscle spasms.  PAST MEDICAL HISTORY:  Migraine headaches, fibromyalgia, MVP, chronic  pain, chronic constipation, status post colectomy for "redundant colon" x2 procedures.  She is status post hysterectomy secondary to endometriosis. Her ovaries are intact.  She is status post appendectomy.  She stated that her headaches were accompanied by photophobia and phonophobia.  HOSPITAL COURSE:  During admission, the patients labs were reviewed and all returned essentially normal.  We found that her CBC, CMET, acute abdominal series, and EKG were all normal.  Physical therapy was consulted to assess her left-sided weakness and ambulation skills.  She was able to ambulate holding furniture for 50 feet.  Nursing staff was able to observe the patient walking uninhibited in her room, however, in the presence of her son or the nursing staff, the patient appeared to weaken.  She was given a rolling walker to assist her with ambulation.  Relief of her headaches was attempted with Zomeg, Demerol without any improvement.  The patient was then started on IV Depakote on May 25 x4 doses IV and then was converted to p.o. on May 26 at the above dose.  She is being discharged with the Depakote as her only discharging medication.  Today she reports that on a scale of 10 to 10 which was her admitting pain level, she is at a 5 out of 10 level.  The patient also stated that she had had a previous complication with Imitrex, that she had received Imitrex three times in the past with complications of heart racing which required nitroglycerin to  reverse.  This medication was not started during this admission.  On date of discharge, the patient reports that a friend of a friend sees Dr. Danie Chandler at [facility] who has started this other friend on Kepra and she was interested on being tried on this drug as well.  She has seen Dr. Meryl Crutch here at the headache clinic and would prefer not to return to him.  This being a holiday, the patient has been advised to call Dr. Irish Elders office and set up an  appointment that would be mutually convenient for her and Dr. Danie Chandler.  DISCHARGE MEDICATIONS:  In a correction to the above, the patient states that she no longer has any Zomeg at home, therefore, she is being given Zomeg 5 mg one p.o. p.r.n. may repeat x1 dose for a total of 10 mg in a 24-hour period. In addition Depakote ER 500 mg one p.o. q.d.  She is being given one prescription and one refill of each.  DISCHARGE PHYSICAL EXAMINATION:  VITAL SIGNS: Stable.  Temperature 98.1, 77, 20, and 97/56.  She is 97% on O2 sat.  There are no new labs.  She had been given a Valium at 8 a.m. and oxycodone at 10 a.m.  She is awake, alert and oriented.  HEART: Regular rhythm.  LUNGS: Clear to auscultation.  There is no edema.  She is lying in bed currently in the company of her son.  She states that her headaches are improved, that her left arm weakness is improved, and the plan is to have her see Dr. Danie Chandler per her schedule with him. Dictated by:   Janora Norlander, N.P. Attending Physician:  Duke Salvia DD:  09/16/01 TD:  09/18/01 Job: [id number]

## 2010-09-09 NOTE — Op Note (Signed)
NAMEMARIKAY, [name]                 ACCOUNT NO.:  [id number]   MEDICAL RECORD NO.:  [id number]          PATIENT TYPE:  INP   LOCATION:  0469                         [facility]ACILITY:  [facility]   PHYSICIAN:  Thornton Park. Daphine Deutscher, M.D.DATE OF BIRTH:  12-07-1950   DATE OF PROCEDURE:  01/12/2004  DATE OF DISCHARGE:                                 OPERATIVE REPORT   PREOPERATIVE DIAGNOSIS:  Chronic upper abdominal pain.   POSTOPERATIVE DIAGNOSIS:  Dilated common bile duct with questionable distal  common bile duct stricture, multiple small bowel adhesions from her ileal  rectosigmoid anastomosis proximal to the ligament of Treitz.   PROCEDURE:  1.  Exploratory laparotomy.  2.  Complete enterolysis.  3.  Open cholecystectomy.  4.  Intraoperative cholangiogram.   SURGEON:  Luretha Murphy, MD   ASSISTANT:  Marcy Panning, MD   ANESTHESIA:  General endotracheal.   DRAINS:  None.   DESCRIPTION OF PROCEDURE:  A 60 year old patient brought to OR 10, given  general anesthesia.  The abdomen was prepped with Betadine and draped  sterilely.  An upper midline incision was used and carried down below the  umbilicus.  The abdomen was entered without difficulty.  She had numerous  loops of small bowel hung and stuck to the anterior incision.  These were  taken down with sharp dissection as well as with the Bovie.  Once this was  free, I began by running her small bowel, eventually from the ligament of  Treitz all the way distally and lysing several loops of bowel that were  stuck both to the right adnexa as well as the left adnexa in the pelvis as  well as to themselves.  Once this was complete, we then turned our attention  to the gallbladder.  In my previous operative note, I had described a  phrygian cap.  Gallbladder was a nice, robin's egg blue color, but it looked  like she had a very large common bile duct.  The gallbladder was grasped,  and I dissected free the cystic duct, incised this, and put in a  Cook  catheter and took a dynamic cholangiogram which showed filling of a very  large common bile duct with intrahepatic filling and flow into the duodenum  but with a possible stricture distally or certainly hypertonic sphincter  apparatus.  The cystic duct was tied off with a 2-0 silk tie, and the  gallbladder was removed without difficulty.  The gallbladder bed was without  bleeding or bile leaks at the end of this phase of the operation.  The  sponge and needle counts were reported as correct.  An NG tube was placed  into the stomach, was in good position.  The fascia was  then closed with #1 PDS from above and below, tied in the middle.  The wound  was irrigated.  I also injected the fascia with some Marcaine 0.5% plain.  The skin was approximated with staples.  The patient was taken to the  recovery room in satisfactory condition.      MBM/MEDQ  D:  01/12/2004  T:  01/12/2004  Job:  [id number]   cc:   Iva Boop, M.D. [facility]

## 2010-09-09 NOTE — Discharge Summary (Signed)
[name], [name]                 ACCOUNT NO.:  [id number]   MEDICAL RECORD NO.:  [id number]          PATIENT TYPE:  INP   LOCATION:  3018                         FACILITY:  [facility]   PHYSICIAN:  Thomos Lemons, D.O. [facility]   DATE OF BIRTH:  [birth year]   DATE OF ADMISSION:  08/04/2005  DATE OF DISCHARGE:  08/08/2005                                 DISCHARGE SUMMARY   DISCHARGE DIAGNOSES:  1.  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  2.  Migraine headache.  3.  History of fibromyalgia.  4.  History of chronic pain syndrome.  5.  Hypothyroidism.  6.  History of mitral valve prolapse.  7.  History of chronic low back pain.   DISCHARGE MEDICATIONS:  1.  Elavil 100 mg at bedtime.  2.  Methadone 5 mg q.6h.  3.  Neurontin 300 mg three times daily.  4.  Synthroid.  Patient to resume previous dose.  5.  Imitrex 25 mg as needed.   FOLLOW-UP INSTRUCTIONS:  She was told to follow up with Dr. Oliver Barre in  one to two weeks' time.   HOSPITAL COURSE:  Patient is a 60 year old female with past medical history  of fibromyalgia, chronic pain syndrome who presented to the ER with right-  sided weakness.  Patient reported unable to stand without collapsing.  Patient was noted to have been recently discharged from [facility]  after accidental household electrocution.  On examination patient was able  to move all four extremities and consultation with neurologist was obtained.  Dr. Thad Ranger felt that patient likely had somatization disorder.  Patient  had MRI of the brain which showed small bilateral white matter lesions,  nonspecific.  Also MRI of the cervical spine was performed which revealed  mild cervical disk degeneration, no acute abnormality.   HOSPITAL COURSE:  #1 - Patient's right-sided symptoms improved/resolved.  Patient also had evaluation by psychiatry who agreed that patient has  somatization disorder.  Patient also diagnosed with depressive disorder.  She was  instructed to continue Elavil 100 mg q.h.s. and also follow up with  her psychiatrist in two weeks' time.   #2 - MIGRAINE HEADACHE:  Patient had exacerbation of her migraine headache  during her hospitalization which improved with Imitrex and Phenergan.   #3 - CHRONIC PAIN SYNDROME:  This remained stable during her  hospitalization.  She was to continue chronic methadone therapy.   CONDITION ON DISCHARGE:  Improved.      Thomos Lemons, D.O. [facility]  Electronically Signed     RY/MEDQ  D:  09/24/2005  T:  09/25/2005  Job:  [id number]   cc:   Jonny Ruiz, M.D.

## 2010-09-09 NOTE — Discharge Summary (Signed)
[name], [name]                 ACCOUNT NO.:  [id number]   MEDICAL RECORD NO.:  [id number]          PATIENT TYPE:  INP   LOCATION:  3029                         FACILITY:  [facility]   PHYSICIAN:  Vikki Ports A. Felicity Coyer, MDDATE OF BIRTH:  Sep 13, 1950   DATE OF ADMISSION:  04/02/2006  DATE OF DISCHARGE:  04/11/2006                               DISCHARGE SUMMARY   DISCHARGE DIAGNOSIS:  1. 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  2. Chronic migraine.  3. History of somatization disorder.   HISTORY OF PRESENT ILLNESS:  [name] is a 60 year old white female  admitted on April 02, 2006, with chief complaint of lower extremity  weakness and numbness. She noted the weakness to be rising in her lower  extremities. She also reports having fallen down the stairs the week  prior to admission.  She was seen by Dr. Jonny Ruiz at that time.  She noted  that she was dragging one foot at that time.  Also note that the patient  discontinue methadone 3 weeks prior to admission which she had been  suing for chronic pain.   PAST MEDICAL HISTORY:  1. Somatization disorder.  2. Chronic migraines.  3. Fibromyalgia.  4. Chronic pain syndrome.  5. Hypothyroidism.  6. Mitral valve prolapse.  7. Chronic low back pain.  8. Chronic constipation.  9. Endometriosis  10.History of left hemiparesis following electrocution at home which      was attributed to somatization disorder April 2007.   COURSE OF HOSPITALIZATION.:  GUILLAIN-BARRE SYNDROME:  The patient was admitted and was seen in  consultation by Dr. Lesia Sago of neurology. She underwent nerve  conduction studies which were abnormal and, per Dr. Anne Hahn, were classic  of Guillain-Barre syndrome. The patient received IVIG for 5 days. Her  lower extremity weakness and numbness improved.  She was seen by  physical therapy who is recommending home health PT and OT at time of  discharge.   The patient also reports that she is  intolerant of TYLENOL and, as a  result, states that she cannot take Vicodin which was prescribed by Dr.  Jonny Ruiz prior to this admission. She will be sent home with 10 tablets of  hydrocodone 5 mg q.6 h p.r.n. with close outpatient followup with Dr.  Jonny Ruiz. We will defer any further changes to her pain regimen to Dr. Jonny Ruiz.   The patient underwent lumbar puncture this admission  which showed  normal protein and glucose levels.  She also underwent an MRI of the  lumbar and thoracic spine which showed no acute changes.  It did note  some mild degenerative disk disease L4-L5 and L5-S1 as well as some a  convex scoliosis to the left.   PERTINENT LABORATORY DATA:  At time of discharge, Lyme antibody was  negative. B12 530. TSH 3.227.  ESR 13. Hemoglobin 11.9, hematocrit 35.6.   DISCHARGE MEDICATIONS:  1. Hydrocodone 5 mg p.o. q.6 h p.r.n. pain.  2. Synthroid 50 mcg p.o. daily.  3. Elavil 100 mg p.o. daily.  4. Zomig 5 mg p.o. daily.   FOLLOWUP:  The  patient is to follow up with Dr. Oliver Barre on Friday,  December 21, a 2:45 p.m. She is also to follow up with Dr. Anne Hahn and  contact his office for an appointment.  We will arrange home health PT  and OT prior to discharge.  She is to call Dr. Jonny Ruiz should she develop  fever greater than 101 or increased lower extremity weakness.      Sandford Craze, NP      Raenette Rover. Felicity Coyer, MD  Electronically Signed    MO/MEDQ  D:  04/11/2006  T:  04/11/2006  Job:  [id number]   cc:   Corwin Levins, MD

## 2010-09-09 NOTE — Procedures (Signed)
[place]. [facility]  Patient:    [name], [name]                        MRN: [id number] Proc. Date: 01/23/00 Adm. Date:  [id number] Attending:  Katha Cabal CC:         Thornton [place]. Daphine Deutscher, M.D.   Procedure Report  DATE OF BIRTH: March 17, 1951  PROCEDURE: Attempted epidural catheter placement.  ANESTHESIOLOGIST: Judie Petit, M.D.  DIAGNOSIS: Carcinoma.  INDICATIONS FOR PROCEDURE: Ms. Kalina is a 60 year old patient who underwent subtotal colectomy today by Dr. Daphine Deutscher.  Prior to the procedure Dr. Ivin Booty had discussed epidural catheter placement with the patient for postoperative pain management.  Reportedly the procedure was discussed in detail with the patient, particularly the risks and benefits of the procedure.  the patients understood apparently that this procedure would be done following surgery while under general anesthesia.  DESCRIPTION OF PROCEDURE: The patient was turned to the right lateral decubitus position for attempted epidural at L4-5 interspace.  After aseptic prep with Betadine and draping were carried out a 17 gauge Tuohy needle was used with loss of resistance technique with preservative-free normal saline. The catheter would not thread and thus the catheter and needle were removed without difficulty, tip intact.  The L3-4 interspace was attempted under sterile technique and again loss of resistance technique with preservative- free normal saline was used.  The catheter was threaded 3 cm without difficulty and subsequently the needle was withdrawn; however, there was occasional heme aspiration with the catheter and in light of this the procedure was aborted.  The catheter was withdrawn without difficulty and the tip was intact.  The needle had been subsequently withdrawn without difficulty as well.  The patient tolerated the procedure well and was turned to the supine position.  Dr. Daphine Deutscher will be informed of the  above.  The patient was subsequently awakened from the anesthesia without difficulty and taken to the postanesthesia care unit in stable condition.  There was no CSF aspiration on any attempt. DD:  01/23/00 TD:  01/24/00 Job: [id number]

## 2010-09-09 NOTE — Discharge Summary (Signed)
[place]. [facility]  Patient:    BOBBY, RAGAN Visit Number: [id number] MRN: [id number]          Service Type: MED Location: 3000 3024 01 Attending Physician:  Evette Georges Dictated by:   Cornell Barman, P.A. Admit Date:  02/10/2001 Discharge Date: 02/14/2001   CC:         Kelli Hope, M.D.  Eliezer Bottom, M.D.  [facility]   Discharge Summary  DISCHARGE DIAGNOSES: 1. Unsteady gait. 2. Recent falls.  HISTORY OF PRESENT ILLNESS:  Ms. Hemric is a 60 year old female who comes into the hospital for evaluation of a four-day history of inability to walk.  The patient states that she felt well until about four days prior to this admission when she developed dizziness and began bumping into walls.  The patient denied any syncope or seizure activity.  The patient was evaluated by the emergency room physician, who was concerned about possible benzodiazepine overdose.  PAST MEDICAL HISTORY: 1. History of mitral valve prolapse. 2. Migraine headaches. 3. Fibromyalgia. 4. Chronic pain.  HOSPITAL COURSE: #1 - GAIT ATAXIA:  We did ask neurology to see the patient, and Dr. Thad Ranger saw the patient on February 10, 2001.  He noted that the patient had been evaluated by Dr. Sandria Manly in 1996 for right arm and leg symptoms, and then again by Dr. Anne Hahn in March 2000 for headaches.  He noted that her chart frequently alluded a personality disorder.  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  He also noted that she has convinced herself that she has multiple sclerosis on the basis of abnormal MRIs, although it is not clear that those changes are not attributed to her chronic migraines.  Dr. 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  Another component would  be fibromyalgia progressing to an end stage.  He could not exclude multiple sclerosis.  However, he thought it was quite unlikely as she did not have any objective abnormalities and a very functional exam.  He did comment that while she might have white matter disease on MRI, she had migraines, and migraineurs can certainly have white matter intensities on MRI which can imitate MS.  He felt that a diagnosis of a demyelinating disease should only be made in this patient with extreme care.  His recommendations were to work towards detoxification.  He recommended introducing the idea that medications do not work as well as fibromyalgia progresses and the need to be weaned in favor of a graduated course of activity.  He did feel that physical therapy and occupational therapy would be appropriate.  We did have physical therapy see the patient, who agreed with a home health physical therapy safety evaluation. They did note that during their evaluation the patient seemed to lose her balance.  Unfortunately, this was not completely witnessed by the therapist. What the therapist commented seeing is the patient lowering herself in a controlled manner to the floor.  The patient then began to complain of right wrist and right hip pain.  The patient could not verbalize to the therapist or staff her reason for falling.  We did pursue bilateral hip, wrist, and hand films which were negative for fractures or dislocations.  We have resumed the patients OxyContin at a lower dose and have instructed her to  follow up at the pain clinic.  #2 - GASTROINTESTINAL:  The patient had significantly elevated LFTs, specifically, transaminases, on admission.  Her acute hepatitis serologies were negative.  Her ANA antibiotic was positive, and her ANA titer was weakly positive.  I am not sure what the significance of this is.  Dr. Marina Goodell felt that her hepatitis was secondary to acetaminophen.  According to the  patient, she had been taking up to 10 acetaminophen daily.  We will advise the patient to avoid acetaminophen in the future.  LABORATORY DATA:  Stool for blood was negative at 28.7.  Coagulations were normal.  SGOT was 65, SGPT was 387, otherwise LFTs were normal.  Hemoglobin 11.6, hematocrit 35.6.  Microsomal antibiotic was at a ratio of 1:20.  DISCHARGE MEDICATIONS: 1. Claritin 10 mg q.d. 2. Elavil 200 mg at bedtime. 3. Zomig as needed. 4. OxyContin 20 mg t.i.d. 5. She is not to use any other Tylenol or acetaminophen.  FOLLOW-UP:  The patient is to follow up at the pain clinic in the next one to two weeks, and follow up with Dr. Debby Bud in the next month for a follow-up hepatic function profile. Dictated by:   Cornell Barman, P.A. Attending Physician:  Evette Georges DD:  02/14/01 TD:  02/15/01 Job: [id number]

## 2010-09-09 NOTE — H&P (Signed)
NAMEGWENDOLIN, [name]                 ACCOUNT NO.:  [id number]   MEDICAL RECORD NO.:  [id number]          PATIENT TYPE:  INP   LOCATION:  4739                         FACILITY:  [facility]   PHYSICIAN:  Wilmon Arms. Corliss Skains, M.D. DATE OF BIRTH:  02/15/1951   DATE OF ADMISSION:  07/12/2005  DATE OF DISCHARGE:                                HISTORY & PHYSICAL   CHIEF COMPLAINT:  Electrocution with right-sided weakness.   HISTORY OF PRESENT ILLNESS:  The patient is a 60 year old female who was  reaching underneath her desk at home to rearrange the cords into her surge  protector. She accidentally grabbed several cords which apparently her dog  had chewed through the insulation. She received a shock and was thrown  backwards. She was brought in by EMS complaining of right-sided weakness.  She states she was unable to move the right side of her neck, her right  upper extremity, or right lower extremity. Sensation, however, seemed to be  intact.   The patient was initially evaluated by the emergency department physician.  She was sent to the CT scanner where she underwent a head CT and a C spine  CT which were both normal. We were then asked to consult for admission.   PAST MEDICAL HISTORY:  1.  Depression.  2.  Mitral valve prolapse.  3.  Migraine headaches.  4.  Fibromyalgia.  5.  Chronic pain syndrome.  6.  Chronic constipation.  7.  Hypothyroidism.   PAST SURGICAL HISTORY:  1.  Laparoscopic cholecystectomy by Dr. Wenda Low in 2006.  2.  Status post colon resection x2 for chronic constipation and redundant      colon.  3.  Total abdominal hysterectomy for endometriosis.  4.  Appendectomy.   PRIMARY CARE DOCTOR:  Dr. Jonny Ruiz   ALLERGIES:  1.  SULFA.  2.  PYRIDIUM.   MEDICATIONS:  Methadone 5 mg q.6h., Elavil, Reglan, Zomig p.r.n., Levoxyl.   SOCIAL HISTORY:  Nonsmoker, nondrinker. The patient is divorced and lives by  herself.   FAMILY HISTORY:  Coronary artery disease.   PHYSICAL EXAMINATION:  VITAL SIGNS:  Afebrile, pulse 83, blood pressure  127/77, respirations 20.  SKIN:  Warm and dry.  HEENT:  EOMI, sclerae anicteric. Normocephalic, atraumatic.  NECK:  Mildly tender over the right sternocleidomastoid. No cervical spine  tenderness. Trachea is midline.  LUNGS:  Clear bilaterally. Normal respiratory effort.  HEART:  Regular rate and rhythm. No murmurs.  ABDOMEN:  Soft, nontender.  EXTREMITIES:  No sign of trauma to the right hand where she grabbed the  electrical cord. No extremity tenderness or swelling. She seems weak on  motor examination of the right upper and lower extremities. However, during  her emergency department stay these symptoms seemed to improve. By the end  of my evaluation she was able to move her right foot with some weakness and  was also able to raise her right arm with some weakness.   LABORATORY DATA:  White count 7.4, hemoglobin 10.9. BUN 20, creatinine 0.9.  Urinalysis trace blood. CK is 51. CT of the head and  C spine are negative.   IMPRESSION:  Accidental electrocution with some right-sided weakness which  is improving.   PLAN:  Will admit for observation and neuro checks, IV hydration, pain  control, and will watch for myoglobinuria, arrhythmia, or worsening  neurologic function.      Wilmon Arms. Tsuei, M.D.  Electronically Signed     MKT/MEDQ  D:  07/12/2005  T:  07/13/2005  Job:  [id number]

## 2010-09-09 NOTE — Discharge Summary (Signed)
NAME:  [name], [name]                           ACCOUNT NO.:  [id number]   MEDICAL RECORD NO.:  [id number]                   PATIENT TYPE:  OBV   LOCATION:  4733                                 FACILITY:  [facility]   PHYSICIAN:  Rene Paci, M.D. [facility]          DATE OF BIRTH:  [birth year]   DATE OF ADMISSION:  07/06/2003  DATE OF DISCHARGE:  07/07/2003                                 DISCHARGE SUMMARY   DIAGNOSES AT DISCHARGE:  1. Weakness.  2. Unwitnessed history of syncope.  3. Nausea, vomiting, diarrhea.   BRIEF ADMISSION HISTORY:  [name] is a 60 year old white female well known  to this service. She presents with complaints of weakness. She also  describes four to five episodes of syncope a day. She states that she has  not been able to exercise to wear shoes for the past 10 days because of  swelling and pain in her legs. She states she injured herself when  attempting to move the vacuum cleaner. It felt on her, and she sustained  multiple bruises. This seems to have precipitated her inability to walk. The  patient had many, many, many other complaints. She had been seen in the  emergency department repeatedly. She had been evaluated by the emergency  room physician and cleared for discharge; however, when she was helped, she  fell to the left side. It was because of this that the emergency room  physician called for further evaluation.   PAST MEDICAL HISTORY:  1. History of noncardiac chest pain status post negative catheterization in     May of 2004 with normal LV function.  2. Gastroesophageal reflux disease.  3. Migraine headaches.  4. Chronic pain and fibromyalgia followed at the pain clinic.  5. Anxiety and depression.   HOSPITAL COURSE:  1. Syncope. The patient was admitted to rule out arrhythmias versus     infarction versus acute stroke. The patient has minimal risk factors for     heart disease or stroke. The patient has no history of hypertension,     diabetes,  tobacco, or family history. The patient had no arrhythmias     while on telemetry. Serial cardiac enzymes were negative, and as she had     a catheterization less than a year ago revealing no coronary disease, the     patient was not felt to require further cardiac evaluation. The patient     had a head CT on admission that was normal; however, we did order a MRI     of the brain to rule out any acute stroke that was not seen on her head     scan. We expect this to be normal and except for her to be discharged.     The patient has not had any orthostatic hypotension, but by her history,     the patient is debilitated as she has lead a fairly sedentary lifestyle.  We expect her weakness and reported syncopal episodes are more secondary     to her debilitated state versus underlying psychological issues.  2. Normocytic anemia. She is hemodynamically stable. B12, ferritin were both     normal.  3. Chronic pain and fibromyalgia. She needs to follow up at the pain clinic     as an outpatient with Dr. __________.  4. Psych. The patient appears to have an acute exacerbation of her anxiety     and depression, likely secondary to underlying life stressors. She states     she is in the middle of a divorce.   DISCHARGE LABORATORY DATA:  Hemoglobin 11. Urinalysis was negative. CMET was  normal. TSH, B12, ferritin, amylase, lipase, and a urinalysis were all  negative.   MEDICATIONS AT DISCHARGE:  She is instructed to resume her home medications  which are:  1. Elavil 100 mg q.h.s.  2. Lexapro dose not known.  3. Zomig p.r.n.  4. Phenergan p.r.n.  5. Valium p.r.n.  6. Oxycodone 10 mg q.6h. p.r.n.   FOLLOW UP:  Follow up with Dr. ___________ and Dr. Jonny Ruiz. Her primary care  physician may want to encourage her to follow up with a psychiatrist as  well.      Cornell Barman, P.A. [facility]                  Rene Paci, M.D. [facility]    LC/MEDQ  D:  07/07/2003  T:  07/09/2003  Job:  [id number]

## 2010-09-09 NOTE — Op Note (Signed)
NAME:  [name], [name]                           ACCOUNT NO.:  [id number]   MEDICAL RECORD NO.:  [id number]                   PATIENT TYPE:  AMB   LOCATION:  DSC                                  FACILITY:  [facility]   PHYSICIAN:  Alfredia Ferguson, M.D.               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   OPERATION PERFORMED:  1. Excision of chronic lesion, left ear helix.  2. Jarrett Soho helical flap for reconstruction.   SURGEON:  Alfredia Ferguson, M.D.   ANESTHESIA:  General laryngeal mask.   INDICATIONS FOR PROCEDURE:  The patient is a 60 year old woman with painful  chronic inflammatory cartilaginous lesion of her left mid ear helix.  It has  been present for more than a year.  It is quite painful and the patient is  not able to put the weight of her head on it when sleeping.  The wound has  finally healed but has left a painful defect in the area where the lesion  originated.  The patient wishes to have it removed.  The plan is to remove  it in a rectangular fashion and advance the inferior helix in a superior  direction based on the postauricular and inferior lobular skin.  The patient  understands the risks of surgery including infection, bleeding and unsightly  scarring.  She understands the risk of recurrence of the lesion.  In spite  of that she wishes to proceed with the operation.   DESCRIPTION OF PROCEDURE:  After adequate general laryngeal mask anesthesia  had been induced, skin markers were placed outlining the dimensions of the  antia flap and the dimensions of the excision.  Approximately 2 mm margins  were used around this lesion.  Local anesthesia using 1%  Xylocaine 1:100,000  epinephrine total volume of 2 cc was infiltrated.  The left ear and left  side of the face were prepped and draped in sterile fashion.  After waiting  approximately 10 minutes, a full thickness rectangular segment of the mid  ear helix was removed including skin and cartilage.  This left a 1 cm in  length defect and approximately 5 mm in width.  An incision was made  beginning at the inferior margin of the defect and making this incision  inferiorly down to the upper ear lobule.  The incision was deepened through  the postauricular skin.  The postauricular skin was now elevated off the  postauricular concha such that this flap would advance in a superior  direction without tension.  After undermining for a distance of  approximately  1.5 cm.  Hemostasis was accomplished using electrocautery.  The edge of the helix was now united with the superior cut edge of the helix  with a single 5-0 nylon suture.  The advancement incision beginning on the  lobule was now closed using multiple interrupted 5-0 nylon sutures.  The ear  was then rotated anteriorly so that I could see the dog ear that was created  posteriorly.  The dog ear was marked and excised.  The postauricular  incision was now closed using multiple interrupted 5-0 nylon sutures.  The  flap appeared to be completely viable.  There was slight notching at the  closure site of the helix due to the difference in size of the helix  inferior compared to superior.  The amount of notching was acceptable.  Bleeding was less than 2 cc during the procedure.  An ear dressing was  placed and the patient was awakened, extubated and transported to the  recovery room in satisfactory condition.                                                 Alfredia Ferguson, M.D.    WBB/MEDQ  D:  12/24/2001  T:  12/24/2001  Job:  [id number]   cc:   Ria Bush. Jorja Loa, M.D.  [address]  [place]  [place] [address]  Fax: [phone]

## 2010-09-09 NOTE — H&P (Signed)
[place]. [facility]  Patient:    [name], [name] Visit Number: [id number] MRN: [id number]          Service Type: MED Location: 3000 3024 01 Attending Physician:  Evette Georges Dictated by:   Evette Georges, M.D. [facility] Admit Date:  02/10/2001   CC:         Rosalyn Gess. Norins, M.D. [facility], [place], [place]  Aram Beecham P. Ashley Royalty, M.D.  Eliezer Bottom, M.D.   History and Physical  DATE OF BIRTH: [birth year]  PRIMARY CARE PHYSICIAN: Rosalyn Gess. Norins, M.D.  PAIN CLINIC: [facility].  GYNECOLOGIST: Cynthia P. Ashley Royalty, M.D.  PSYCHIATRIST: Eliezer Bottom, M.D.  CHIEF COMPLAINT: This is one of many [place]. [facility] admissions for this 60 year old female, who comes into the hospital via the emergency room for evaluation of a four day history of inability to walk.  HISTORY OF PRESENT ILLNESS: The patient relates that she felt well until four days prior to admission, when she developed a sensation of dizziness and was bumping into walls.  She states there is no change in her chronic medical therapy.  Three days prior to admission she had she was described as a near syncopal episode, although she did not completely pass out.  One day prior to admission she noted again difficulty walking and bumping into things.  She therefore came to the emergency room.  She was seen and evaluated by Dr. Ignacia Palma.  His assessment was overdose of benzos.  Basic chemistries were drawn, and because she was unable to walk he felt like she needed to be admitted for evaluation and observation.  PAST MEDICAL HISTORY:  1. History of MVP.  2. Migraine headaches.  3. Fibromyalgia, followed at [facility].  4. She has also been in the hospital as she has had two children.  Past illnesses otherwise negative.  INJURIES: Negative.  ALLERGIES:  1. SULFA.  2. PERDIEM.  CURRENT MEDICATIONS:  1.  Zomig sublingual p.r.n. for migraine headaches.  2. OxyContin 120 mg q.d. in divided dose, 40 mg t.i.d.  Her opiate screen is     negative).  3. Claritin 10 mg q.a.m.  4. Elavil 200 mg q.h.s.  5. Valium p.r.n.  She says she has not had any Valium in a couple of days,     but her benzos came back positive.  REVIEW OF SYSTEMS: Negative except for chronic pain syndrome.  SOCIAL HISTORY: She is single.  She has two children.  She is self-employed. She states she does not smoke, nor drink except for an occasional drink of alcohol.  (The patient is well-known by me.  She was one of my neighbors years ago.  She has a long history of personality disorder).  FAMILY HISTORY: Negative for GI disease.  LABORATORY DATA: Pertinent laboratory data in the emergency room showed a normal CBC, a normal UA, normal metabolic panel except her SGOT was 872, SGPT 1572, alkaline phosphatase 98, with a normal bilirubin.  Lipase and amylase are pending.  Also, CT brain scan was done.  That was reported to be within normal limits.  PHYSICAL EXAMINATION:  VITAL SIGNS: TEMP 98 degrees, pulse 80 and regular, respirations 12 and regular, BP 120/80.  GENERAL: She is a well-developed, well-nourished white female, in absolutely no acute distress, requiring her OxyContin because she says she is hurting, but when lying on the cart she goes to sleep.  HEENT/NECK: Negative.  No scleral icterus.  The neck was supple, with no thyroid enlargement.  CHEST: Clear to auscultation.  CARDIAC: Negative.  ABDOMEN: Negative except for scar from previous surgery by Dr. Daphine Deutscher.  She had a redundant colon removed and has had multiple problems since that time.  EXTREMITIES: Normal skin, peripheral pulses normal noted.  NEUROLOGIC: She is oriented x 3.  Cranial nerves 2-12 intact and symmetrical. She says she is unable to walk.  We have to help her to sit up in the upright position.  IMPRESSION:  1. Inability to walk,  with abnormal liver function tests.  Rule out     pancreatitis, rule out gallbladder disease.  She has had numerous     admissions for these type of issues in the past.  They have always turned     out to be fictitious.  2. Chronic pain syndrome.  3. History of migraine headaches.  4. Allergic rhinitis.  PLAN: Admit, work up.  (I called Dr. Evelene Croon.  Dr. Katrinka Blazing is on-call covering, but we have not heard from Dr. Katrinka Blazing at this juncture.  I feel it is important we get her psychiatrist involved in her care). Dictated by:   Evette Georges, M.D. [facility] Attending Physician:  Evette Georges DD:  02/10/01 TD:  02/11/01 Job: [id number]

## 2010-09-09 NOTE — Consult Note (Signed)
[place]. [facility]  Patient:    [name], [name] Visit Number: [id number] MRN: [id number]          Service Type: MED Location: [id number] Attending Physician:  Evette Georges Dictated by:   Kelli Hope, M.D. Proc. Date: 02/10/01 Admit Date:  02/10/2001                            Consultation Report  DATE OF BIRTH:  1950/06/03  REFERRING PHYSICIAN:  Evette Georges, M.D.  REASON FOR CONSULTATION:  Unsteady gait.  HISTORY OF PRESENT ILLNESS:  This is a reconsultation evaluation on this existing [facility] patient, a 60 year old woman assessed by Genene Churn. Love, M.D. in 1996 for right arm and leg symptoms and by C. Lesia Sago, M.D. in March 2000 for headaches. 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 She was admitted to the hospital on February 10, 2001, complaining of difficulty of walking for four days. She says she has had eight or nine falls over that period of time. She says that she has been falling over things at home and thinks there is something wrong with her vision, for example she walks towards something but will not wind up coming up to it. She denies any particular loss of vision or any diplopia. She complains of weakness in both lower extremities along with the current left-sided numbness and tingling. She came to the ER with these complaints and was found to have markedly elevated liver enzymes and was subsequently admitted for observation.  PAST MEDICAL HISTORY:  Fibromyalgia and chronic pain. She is seen at a pain clinic in [place] for this. She is followed by a psychiatrist for psychiatric issues. 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 She has a long history of migraines. She is fairly convinced that she has multiple sclerosis on the basis of abnormal MRIs, although  it is not clear that those changes are not attributable to her chronic migraines.  FAMILY HISTORY:  She has an aunt with multiple sclerosis. Father died of endocarditis.  SOCIAL HISTORY:  She divorced this year. She lives in [place] with her 34 year old son. She works as a International aid/development worker. She denies tobacco, alcohol, or IV drug use.  ALLERGIES:  SULFA and PERIDIN.  MEDICATIONS: 1. OxyContin 40 mg t.i.d. 2. Zomig 2.5 mg sublingual p.r.n. migraines. 3. Valium 2.5 mg p.r.n. about three a day. 4. Amitriptyline 200 mg q.h.s. 5. Claritin.  REVIEW OF SYSTEMS:  She says she has migraines, about two a week. She has had numbness and tingling on the left side for a few days now. Her vision is blurred, but she denies diplopia. She denies chest pain or constipation. There has been some shortness of breath, nausea, and chronic joint pain.  PHYSICAL EXAMINATION:  VITAL SIGNS:  Temperature 97.8, blood pressure 98/58, pulse 86, respirations 20.  GENERAL/MENTAL STATUS:  She is alert and in no evident distress. There seems to be some occasional hesitancy with words, but speech is otherwise normal in tone and content.  NEUROLOGICAL:  Cranial nerves:  Funduscopic exam is benign, in particular optic atrophy is not noted. Pupils are equal and briskly reactive, and there is no afferent pupillary defect. Extraocular movements are normal without nystagmus. Face, tongue, and palate move normally and symmetrically. There is midline splitting to pinprick across the  forehead and to a lesser extent with vibration. Motor exam:  Normal bulk and tone. There is some giveway weakness in the left, but strength is full throughout. Sensation:  She reports a diminished pinprick and vibratory sensation on the left in the arm and leg up to the shoulder. Reflexes are 1+ and symmetric. Toes are downgoing. Rapid alternating movements are performed adequately although a little slowly. Finger-to-nose is performed  fairly well. Gait exam:  She walks very slowly and deliberately with sort of slow antasia-abasia gait. She is not particularly ataxic.  LABORATORY DATA:  On admission, AST 872, ALT 1572, now down to 356 and 963, respectively. CBC is normal. BMET remarkable for slightly elevated glucose. Urine drug screen was positive for benzodiazepines on admission.  She had a CT of the head on admission which was negative.  Her MRIs are not available for my review.  IMPRESSION: 1. Gait disorder. Etiology is unclear, although I agree it is most likely due    to over medication. Another component is fibromyalgia progressing to end    stage. 2. Possible multiple sclerosis. I think it is quite unlikely that she has    this. She does not have any objective abnormalities and a very functional    exam. While she might have white matter disease on MRI, she has migraine    and migraineurs can certainly have white matter intensities on MRI which    can imitate MS. I think a diagnosis of demyelinating disease should only be    made in this patient with extreme care.  RECOMMENDATIONS: 1. Agree that work needs to be done on detoxification. Would suggest    introducing the idea that medications do not work as well as fibromyalgia    progresses and the need to be weaned in favor of a graduated course of    activity. 2. Suggest home physical and occupational therapy for assistance at home. 3. I think everyone is better off if an extensive workup for MS is not    aggressively pursued. However, if she really wants it, I would strongly    recommend that it be done at a tertiary specialty care center. Of note,    she has been at the [facility] before, and that is where she was    originally diagnosed with fibromyalgia. Dictated by:   Kelli Hope, M.D. Attending Physician:  Evette Georges DD:  02/11/01 TD:  02/12/01 Job: [id number]

## 2010-09-15 ENCOUNTER — Telehealth: Payer: Self-pay | Admitting: *Deleted
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
## 2010-09-16 NOTE — Telephone Encounter (Signed)
Did tessalon perles help? Is she taking any cough syrup? She may have refill on tessalon perles if it helped. She can take robitussin or delsym or syrup of choice. If cough persists through the weekend will need ov next week.

## 2010-09-16 NOTE — Telephone Encounter (Signed)
Pt informed. She will contunue tessalon pearles and OTC syrups and call us next week if symptoms persist. She should have Rf on file for [employer].

## 2010-09-29 ENCOUNTER — Inpatient Hospital Stay (HOSPITAL_COMMUNITY)
Admission: EM | Admit: 2010-09-29 | Discharge: 2010-10-01 | DRG: 312 | Disposition: A | Discharge status: Home / Self Care | Payer: Self-pay | Attending: Internal Medicine | Admitting: Internal Medicine

## 2010-09-29 DIAGNOSIS — Z882 Allergy status to sulfonamides status: Secondary | ICD-10-CM

## 2010-09-29 DIAGNOSIS — R05 Cough: Secondary | ICD-10-CM | POA: Diagnosis present

## 2010-09-29 DIAGNOSIS — R29898 Other symptoms and signs involving the musculoskeletal system: Secondary | ICD-10-CM | POA: Diagnosis present

## 2010-09-29 DIAGNOSIS — F3289 Other specified depressive episodes: Secondary | ICD-10-CM | POA: Diagnosis present

## 2010-09-29 DIAGNOSIS — IMO0001 Reserved for inherently not codable concepts without codable children: Secondary | ICD-10-CM | POA: Diagnosis present

## 2010-09-29 DIAGNOSIS — E039 Hypothyroidism, unspecified: Secondary | ICD-10-CM | POA: Diagnosis present

## 2010-09-29 DIAGNOSIS — Z8249 Family history of ischemic heart disease and other diseases of the circulatory system: Secondary | ICD-10-CM

## 2010-09-29 DIAGNOSIS — R059 Cough, unspecified: Secondary | ICD-10-CM | POA: Diagnosis present

## 2010-09-29 DIAGNOSIS — F329 Major depressive disorder, single episode, unspecified: Secondary | ICD-10-CM | POA: Diagnosis present

## 2010-09-29 DIAGNOSIS — R55 Syncope and collapse: Principal | ICD-10-CM | POA: Diagnosis present

## 2010-09-29 LAB — CBC
HCT: 35.5 % — ABNORMAL LOW (ref 36.0–46.0)
Hemoglobin: 11.5 g/dL — ABNORMAL LOW (ref 12.0–15.0)
RBC: 4.11 MIL/uL (ref 3.87–5.11)
WBC: 6.7 10*3/uL (ref 4.0–10.5)

## 2010-09-30 ENCOUNTER — Inpatient Hospital Stay (HOSPITAL_COMMUNITY): Payer: Self-pay

## 2010-09-30 ENCOUNTER — Emergency Department (HOSPITAL_COMMUNITY): Payer: Self-pay

## 2010-09-30 DIAGNOSIS — R5383 Other fatigue: Secondary | ICD-10-CM

## 2010-09-30 DIAGNOSIS — F329 Major depressive disorder, single episode, unspecified: Secondary | ICD-10-CM

## 2010-09-30 DIAGNOSIS — R5381 Other malaise: Secondary | ICD-10-CM

## 2010-09-30 DIAGNOSIS — IMO0001 Reserved for inherently not codable concepts without codable children: Secondary | ICD-10-CM

## 2010-09-30 DIAGNOSIS — R05 Cough: Secondary | ICD-10-CM

## 2010-09-30 DIAGNOSIS — R42 Dizziness and giddiness: Secondary | ICD-10-CM

## 2010-09-30 DIAGNOSIS — I517 Cardiomegaly: Secondary | ICD-10-CM

## 2010-09-30 LAB — COMPREHENSIVE METABOLIC PANEL
ALT: 16 U/L (ref 0–35)
Alkaline Phosphatase: 83 U/L (ref 39–117)
CO2: 25 mEq/L (ref 19–32)
Chloride: 106 mEq/L (ref 96–112)
GFR calc Af Amer: >60 mL/min (ref >60)
GFR calc non Af Amer: >60 mL/min (ref >60)
Glucose, Bld: 138 mg/dL — ABNORMAL HIGH (ref 70–99)
Potassium: 3.9 mEq/L (ref 3.5–5.1)
Sodium: 139 mEq/L (ref 135–145)

## 2010-09-30 LAB — DIFFERENTIAL
Basophils Relative: 0 % (ref 0–1)
Monocytes Absolute: 0.1 10*3/uL (ref 0.1–1.0)
Monocytes Relative: 2 % — ABNORMAL LOW (ref 3–12)
Neutro Abs: 4.3 10*3/uL (ref 1.7–7.7)

## 2010-09-30 LAB — URINALYSIS, ROUTINE W REFLEX MICROSCOPIC
Bilirubin Urine: NEGATIVE
Nitrite: NEGATIVE
Specific Gravity, Urine: 1.012 (ref 1.005–1.030)
Urobilinogen, UA: 0.2 mg/dL (ref 0.0–1.0)

## 2010-09-30 LAB — D-DIMER, QUANTITATIVE: D-Dimer, Quant: 0.9 ug/mL-FEU — ABNORMAL HIGH (ref 0.00–0.48)

## 2010-09-30 LAB — CBC
HCT: 34 % — ABNORMAL LOW (ref 36.0–46.0)
Hemoglobin: 11 g/dL — ABNORMAL LOW (ref 12.0–15.0)
MCH: 28.3 pg (ref 26.0–34.0)
MCHC: 32.4 g/dL (ref 30.0–36.0)

## 2010-09-30 LAB — URINE MICROSCOPIC-ADD ON

## 2010-09-30 LAB — BASIC METABOLIC PANEL
Calcium: 8.6 mg/dL (ref 8.4–10.5)
GFR calc Af Amer: >60 mL/min (ref >60)
GFR calc non Af Amer: >60 mL/min (ref >60)
Glucose, Bld: 128 mg/dL — ABNORMAL HIGH (ref 70–99)
Sodium: 138 mEq/L (ref 135–145)

## 2010-09-30 LAB — CK TOTAL AND CKMB (NOT AT ARMC)
Relative Index: 2.7 — ABNORMAL HIGH (ref 0.0–2.5)
Total CK: 799 U/L — ABNORMAL HIGH (ref 7–177)
Total CK: 550 U/L — ABNORMAL HIGH (ref 7–177)

## 2010-09-30 LAB — TROPONIN I
Troponin I: <0.3 ng/mL (ref <0.30)
Troponin I: <0.3 ng/mL (ref <0.30)

## 2010-09-30 MED ORDER — IOHEXOL 300 MG/ML  SOLN
75.0000 mL | Freq: Once | INTRAMUSCULAR | Status: AC | PRN
  Administered 2010-09-30: 75 mL via INTRAVENOUS

## 2010-10-01 ENCOUNTER — Other Ambulatory Visit: Payer: Self-pay | Admitting: Internal Medicine

## 2010-10-01 DIAGNOSIS — R059 Cough, unspecified: Secondary | ICD-10-CM

## 2010-10-01 DIAGNOSIS — R0602 Shortness of breath: Secondary | ICD-10-CM

## 2010-10-01 DIAGNOSIS — R05 Cough: Secondary | ICD-10-CM

## 2010-10-01 LAB — CARDIAC PANEL(CRET KIN+CKTOT+MB+TROPI)
CK, MB: 8.4 ng/mL (ref 0.3–4.0)
Relative Index: 2.6 — ABNORMAL HIGH (ref 0.0–2.5)
Total CK: 320 U/L — ABNORMAL HIGH (ref 7–177)

## 2010-10-01 LAB — BASIC METABOLIC PANEL
Calcium: 7.9 mg/dL — ABNORMAL LOW (ref 8.4–10.5)
GFR calc non Af Amer: >60 mL/min (ref >60)
Sodium: 141 mEq/L (ref 135–145)

## 2010-10-01 LAB — CK TOTAL AND CKMB (NOT AT ARMC)
CK, MB: 30.8 ng/mL (ref 0.3–4.0)
Relative Index: 3 — ABNORMAL HIGH (ref 0.0–2.5)
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
## 2010-10-03 ENCOUNTER — Other Ambulatory Visit: Payer: Self-pay | Admitting: Internal Medicine

## 2010-10-03 ENCOUNTER — Telehealth: Payer: Self-pay | Admitting: *Deleted

## 2010-10-03 DIAGNOSIS — R0602 Shortness of breath: Secondary | ICD-10-CM

## 2010-10-03 DIAGNOSIS — R05 Cough: Secondary | ICD-10-CM

## 2010-10-03 MED ORDER — GABAPENTIN 300 MG PO CAPS: 300.0000 mg | ORAL_CAPSULE | Freq: Every day | ORAL | Status: DC

## 2010-10-03 NOTE — Telephone Encounter (Signed)
Patient requesting RX to help w/cough. She is currently taking "the cough syrup" (did you prescribe med at d/c from hospital? AND also taking tessalon perles. She is req rx for narcotic cough syrup that she had last year. (I can't find record of cough med)

## 2010-10-03 NOTE — Telephone Encounter (Signed)
Patient informed. 

## 2010-10-03 NOTE — Telephone Encounter (Signed)
PFT order done. I haven't used gaba for cough but if it helped her before we can retry: gabpentin 300mg  qhs, #30, refill prn

## 2010-10-03 NOTE — Telephone Encounter (Signed)
Pretty sure I gave her phenergan with codein cough syrup at discharge from hospital

## 2010-10-03 NOTE — Telephone Encounter (Signed)
Spoke w/pt, rx for prometh/codiene was called in by Dr Debby Bud. Advised pt this was narcotic and to continue both meds given. She says she was given gabapentin in the past for cough and does not know why she stopped med. Original RX was gabapentin 100 mg (titrate to 300 mg tid) - 09/22/2008. This was to replace amitriptyline, pt was then only taking 300 mg hs per our records and it was removed from med list early 2011. There are no notes regarding this tx pt for cough, only pain.   1. Please put in PFT orders 2. Should pt resume gabapentin?

## 2010-10-05 ENCOUNTER — Other Ambulatory Visit: Payer: Self-pay | Admitting: *Deleted

## 2010-10-05 ENCOUNTER — Telehealth: Payer: Self-pay | Admitting: *Deleted

## 2010-10-05 DIAGNOSIS — R0602 Shortness of breath: Secondary | ICD-10-CM

## 2010-10-05 DIAGNOSIS — R05 Cough: Secondary | ICD-10-CM

## 2010-10-05 NOTE — Telephone Encounter (Signed)
Xrays have been ordered. OV scheduled for 11:00am 10/06/10 [per conversation w/Pt]

## 2010-10-05 NOTE — Telephone Encounter (Signed)
OK or OV. Will need PA and Lateral CXR prior to being seen.

## 2010-10-05 NOTE — Telephone Encounter (Signed)
LMOM to inform Pt to please call office ASAP so that we can get her on tomorrow's schedule for an OV before all available openings are taken, and that we will need her to be here at least 30 minutes prior to appointment time for chest x-rays downstairs in Imaging.

## 2010-10-05 NOTE — Telephone Encounter (Signed)
Patient c/o no improvement post hospital w/cough & SOB.  Pt states that pulmonology appointment/testing is scheduled for 10/17/10 and she "cannot believe that she is like this and cannot be seen until the 25th". Pt states that she was told to call us about this matter.?
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
## 2010-10-06 ENCOUNTER — Ambulatory Visit (INDEPENDENT_AMBULATORY_CARE_PROVIDER_SITE_OTHER)
Admission: RE | Admit: 2010-10-06 | Discharge: 2010-10-06 | Disposition: A | Discharge status: Home / Self Care | Payer: Self-pay | Source: Ambulatory Visit | Attending: Internal Medicine | Admitting: Internal Medicine

## 2010-10-06 ENCOUNTER — Telehealth: Payer: Self-pay | Admitting: *Deleted

## 2010-10-06 ENCOUNTER — Ambulatory Visit (INDEPENDENT_AMBULATORY_CARE_PROVIDER_SITE_OTHER): Payer: Self-pay | Admitting: Internal Medicine

## 2010-10-06 VITALS — BP 120/78 | HR 82 | Temp 98.5°F | Wt 146.0 lb

## 2010-10-06 DIAGNOSIS — R0602 Shortness of breath: Secondary | ICD-10-CM

## 2010-10-06 DIAGNOSIS — R05 Cough: Secondary | ICD-10-CM

## 2010-10-06 MED ORDER — PREDNISONE 10 MG PO TABS: 10.0000 mg | ORAL_TABLET | Freq: Every day | ORAL | Status: AC

## 2010-10-06 NOTE — Telephone Encounter (Signed)
Pt left vm - she says she spoke w/ MD today at OV. DO we need to try alt facility for pulmonary function testing? Or ok to keep 6/25 apt?

## 2010-10-06 NOTE — Telephone Encounter (Signed)
At her visit she saw me enter an order to [facility] to try and get an earlier appt for PFTs at [facility]!! Also put in order for pulmonary consult.

## 2010-10-07 ENCOUNTER — Telehealth: Payer: Self-pay | Admitting: *Deleted

## 2010-10-07 NOTE — Telephone Encounter (Signed)
Called pt no answer left msg on personal cell normal cxr. Per [facility] has already spoken to pt concerning PFT appt for Monday.Marland KitchenMarland Kitchen6/15/12@2 :08pm/LMB

## 2010-10-07 NOTE — Progress Notes (Signed)
  Subjective:    Patient ID: [name], female    DOB: 1951-02-16, 60 y.o.   MRN: [id number]  HPI [name] was just hospitalized for syncope and persistent cough with shortness of breath. Her evaluation was benign: negative MRI brain, negative CT brain, neative CT angio chest, normal labs. She was discharged home with tessalon perles and phenergan with codeine for cough and was to be scheduled for PFTs. She does have appointment for PFTs June 25th but is continually plagued by her non-productive cough. She is seen today to insure adequte oxygenation, recheck CXR and work to get sooner PFT appt and to set up Pulmonary consult.   PMH, FamHx and SocHx reviewed for any changes and relevance.    Review of Systems Review of Systems  Constitutional:  Negative for fever, chills, activity change and unexpected weight change.  HEENT:  Negative for hearing loss, ear pain, congestion, neck stiffness and postnasal drip. Negative for sore throat or swallowing problems. Negative for dental complaints.   Eyes: Negative for vision loss or change in visual acuity.  Respiratory: Negative for chest tightness. Positive for cough, dyspnea.   Cardiovascular: Negative for chest pain and palpitationNo decreased exercise tolerance Gastrointestinal: No change in bowel habit. No bloating or gas. No reflux or indigestion Genitourinary: Negative for urgency, frequency, flank pain and difficulty urinating.  Musculoskeletal: Negative for myalgias, back pain, arthralgias and gait problem.  Neurological: Negative for dizziness, tremors, weakness and headaches.  Hematological: Negative for adenopathy.  Psychiatric/Behavioral: Negative for behavioral problems and dysphoric mood.       Objective:   Physical Exam Vitals noted - especially normal oxygen saturation at 97% Gen'l - WNWD white woman in some distress with continuous coughing HEENT - [place]/AT, throat clear Lungs - no wheezing on exam, good air movement despite  persistent cough.       Assessment & Plan:

## 2010-10-07 NOTE — Telephone Encounter (Signed)
CXR was read as normal. Per visit note and phone note [facility]'s instructed to work on sooner PFTs. I am sure they are working on it.

## 2010-10-07 NOTE — Telephone Encounter (Signed)
Patient call left message requesting results back from cxr, also wanting PFT test set up ASAP. Pt states her cough has gotten worse. Pls advise?
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
## 2010-10-10 ENCOUNTER — Ambulatory Visit (HOSPITAL_COMMUNITY)
Admission: RE | Admit: 2010-10-10 | Discharge: 2010-10-10 | Disposition: A | Discharge status: Home / Self Care | Payer: Self-pay | Source: Ambulatory Visit | Attending: Internal Medicine | Admitting: Internal Medicine

## 2010-10-10 DIAGNOSIS — R0609 Other forms of dyspnea: Secondary | ICD-10-CM | POA: Insufficient documentation

## 2010-10-10 DIAGNOSIS — R059 Cough, unspecified: Secondary | ICD-10-CM | POA: Insufficient documentation

## 2010-10-10 DIAGNOSIS — R062 Wheezing: Secondary | ICD-10-CM | POA: Insufficient documentation

## 2010-10-10 DIAGNOSIS — J988 Other specified respiratory disorders: Secondary | ICD-10-CM | POA: Insufficient documentation

## 2010-10-10 DIAGNOSIS — R05 Cough: Secondary | ICD-10-CM | POA: Insufficient documentation

## 2010-10-10 DIAGNOSIS — E669 Obesity, unspecified: Secondary | ICD-10-CM | POA: Insufficient documentation

## 2010-10-10 DIAGNOSIS — R0989 Other specified symptoms and signs involving the circulatory and respiratory systems: Secondary | ICD-10-CM | POA: Insufficient documentation

## 2010-10-10 LAB — PULMONARY FUNCTION TEST

## 2010-10-11 ENCOUNTER — Telehealth: Payer: Self-pay | Admitting: *Deleted

## 2010-10-11 NOTE — Telephone Encounter (Signed)
Sleep study done and reveals some obstructive changes with no response to bronchodilators.  Plan - no need for  Inhalers           No need for continued steroids after completing current course           Keep pulmonary consult with Dr. Vassie Loll July 19th

## 2010-10-11 NOTE — Telephone Encounter (Signed)
Pt states that she had PFT done at [facility] and is now inquiring about getting set up w/Pulmonary. Pt canceled appointment set for 10/17/10, but is showing new appt set for 11/10/10.

## 2010-10-12 ENCOUNTER — Ambulatory Visit: Payer: Self-pay | Admitting: Pulmonary Disease

## 2010-10-12 NOTE — Telephone Encounter (Signed)
Pt informed

## 2010-10-13 ENCOUNTER — Encounter: Payer: Self-pay | Admitting: Internal Medicine

## 2010-10-13 ENCOUNTER — Ambulatory Visit (INDEPENDENT_AMBULATORY_CARE_PROVIDER_SITE_OTHER): Payer: Self-pay | Admitting: Internal Medicine

## 2010-10-13 VITALS — BP 120/74 | HR 78 | Ht 66.0 in | Wt 150.0 lb

## 2010-10-13 DIAGNOSIS — R05 Cough: Secondary | ICD-10-CM

## 2010-10-13 DIAGNOSIS — K219 Gastro-esophageal reflux disease without esophagitis: Secondary | ICD-10-CM

## 2010-10-13 MED ORDER — MONTELUKAST SODIUM 10 MG PO TABS: 10.0000 mg | ORAL_TABLET | Freq: Every day | ORAL | Status: DC
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
## 2010-10-13 NOTE — Patient Instructions (Signed)
Script sent for Singulair   Sample Qvar 40-   2 puffs and rinse mouth well, twice daily  Use Pepcid twice daily before meals  Liberal use of throat lozenges, sips of liquids etc  Ok to continue use of benzonatate or cough syrup as needed.  Finish the prednisone taper

## 2010-10-13 NOTE — Assessment & Plan Note (Addendum)
Onset with a specific exposure event  Could qualify as Reactive Airway Dysfunction Syndrome (RADS), but  VCD, cyclical cough with induction of reflux which triggers more cough, tracheobronchitis, cough equivalent asthma are all possibilities. She is still finishing a prednisone taper. We will try Singulair, Qvar 40, active use of throat lozenges, double pepcid to twice daily.

## 2010-10-18 NOTE — Assessment & Plan Note (Signed)
Cough may be inducing reflux which in turn triggers more cough.

## 2010-10-28 ENCOUNTER — Institutional Professional Consult (permissible substitution): Payer: Self-pay | Admitting: Emergency Medicine

## 2010-11-10 ENCOUNTER — Institutional Professional Consult (permissible substitution): Payer: Self-pay | Admitting: Pulmonary Disease

## 2010-11-17 ENCOUNTER — Encounter: Payer: Self-pay | Admitting: Internal Medicine

## 2010-11-17 ENCOUNTER — Ambulatory Visit (INDEPENDENT_AMBULATORY_CARE_PROVIDER_SITE_OTHER): Payer: Self-pay | Admitting: Internal Medicine

## 2010-11-17 VITALS — BP 134/80 | HR 89 | Ht 66.0 in | Wt 153.8 lb

## 2010-11-17 DIAGNOSIS — R05 Cough: Secondary | ICD-10-CM

## 2010-11-17 MED ORDER — BECLOMETHASONE DIPROPIONATE 40 MCG/ACT IN AERS: 2.0000 | INHALATION_SPRAY | Freq: Two times a day (BID) | RESPIRATORY_TRACT | Status: DC

## 2010-11-17 NOTE — Assessment & Plan Note (Addendum)
Tracheobronchitis. Watch for ongoing intermittent reflux association.  We will continue Qvar. She will seek medication cost assistance .
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
## 2010-11-17 NOTE — Patient Instructions (Signed)
Qvar script rewritten for assistance form. If you are doing well, it would be ok to reduce to 2 puffs once daily

## 2010-12-07 ENCOUNTER — Telehealth: Payer: Self-pay | Admitting: *Deleted

## 2010-12-07 ENCOUNTER — Ambulatory Visit: Payer: Self-pay | Admitting: Internal Medicine

## 2010-12-07 DIAGNOSIS — Z0289 Encounter for other administrative examinations: Secondary | ICD-10-CM

## 2010-12-07 NOTE — Telephone Encounter (Signed)
Pt left vm req to know when she needed f/u OV, she will be due for RF's soon.

## 2010-12-08 NOTE — Telephone Encounter (Signed)
Missed appointment this week  So she is due a ov to get med refilled

## 2010-12-09 NOTE — Telephone Encounter (Signed)
Pt has appointment 12/15/2010

## 2010-12-15 ENCOUNTER — Encounter: Payer: Self-pay | Admitting: Internal Medicine

## 2010-12-15 ENCOUNTER — Ambulatory Visit (INDEPENDENT_AMBULATORY_CARE_PROVIDER_SITE_OTHER): Payer: Self-pay | Admitting: Internal Medicine

## 2010-12-15 DIAGNOSIS — R498 Other voice and resonance disorders: Secondary | ICD-10-CM

## 2010-12-15 DIAGNOSIS — IMO0001 Reserved for inherently not codable concepts without codable children: Secondary | ICD-10-CM

## 2010-12-15 MED ORDER — DIAZEPAM 2 MG PO TABS: 2.0000 mg | ORAL_TABLET | Freq: Three times a day (TID) | ORAL | Status: DC

## 2010-12-15 MED ORDER — METHADONE HCL 10 MG PO TABS: 10.0000 mg | ORAL_TABLET | Freq: Three times a day (TID) | ORAL | Status: DC

## 2010-12-15 NOTE — Assessment & Plan Note (Signed)
Doing better, especially since starting singulair.  Plan - continue present regimen.

## 2010-12-15 NOTE — Assessment & Plan Note (Signed)
Doing well on her current regimen. Tolerating well without undue side effects  Plan - refil Rx 30 days with 3 scripts.

## 2010-12-15 NOTE — Progress Notes (Signed)
  Subjective:    Patient ID: [name], female    DOB: 10/08/1950, 60 y.o.   MRN: [id number]  HPI  Ms. Kable returns for follow-up. She remains hoarse but is breathing better. She has seen Dr. Maple Hudson who prescribed singulair which she has just now filled. After six days she has seen a difference. Decreased coughing and some decrease in hoarseness. She reports good pain control. She has not had any problems with somnolence or GI disturbance.   I have reviewed the patient's medical history in detail and updated the computerized patient record.    Review of Systems System review is negative for any constitutional, cardiac, pulmonary, GI or neuro symptoms or complaints     Objective:   Physical Exam Vitals - stable Gen'l - WNWD white woman who is looking better than she has for months. HEENT - C&S clear Cor - RRR Pulm - no increased WOB, normal respirations. Hoarse voice       Assessment & Plan:

## 2011-01-18 LAB — DIFFERENTIAL
Basophils Relative: 1
Eosinophils Absolute: 0
Eosinophils Relative: 1
Monocytes Relative: 7
Neutrophils Relative %: 74

## 2011-01-18 LAB — CBC
HCT: 34.1 — ABNORMAL LOW
MCHC: 34.1
MCV: 83.9
RBC: 4.06

## 2011-01-18 LAB — CARDIAC PANEL(CRET KIN+CKTOT+MB+TROPI)
CK, MB: 2.9
Relative Index: 2.5
Relative Index: INVALID
Total CK: 115
Total CK: 72
Total CK: 86
Troponin I: 0.01

## 2011-01-18 LAB — BASIC METABOLIC PANEL
BUN: 9
CO2: 26
Chloride: 101
Creatinine, Ser: 0.71
GFR calc Af Amer: >60

## 2011-01-18 LAB — TROPONIN I: Troponin I: 0.02

## 2011-01-18 LAB — CK TOTAL AND CKMB (NOT AT ARMC)
CK, MB: 2.4
Total CK: 52

## 2011-01-18 LAB — POCT CARDIAC MARKERS: Myoglobin, poc: 73.9

## 2011-01-18 LAB — PROTIME-INR
INR: 0.9
Prothrombin Time: 12.8

## 2011-02-16 ENCOUNTER — Other Ambulatory Visit: Payer: Self-pay | Admitting: Internal Medicine

## 2011-03-20 ENCOUNTER — Other Ambulatory Visit: Payer: Self-pay | Admitting: *Deleted

## 2011-03-20 ENCOUNTER — Telehealth: Payer: Self-pay | Admitting: *Deleted

## 2011-03-20 MED ORDER — METHADONE HCL 10 MG PO TABS: 10.0000 mg | ORAL_TABLET | Freq: Three times a day (TID) | ORAL | Status: DC

## 2011-03-20 NOTE — Telephone Encounter (Signed)
Scripts printed out MD signed and pt was here to pick up

## 2011-03-20 NOTE — Telephone Encounter (Signed)
Pt requesting refill on methadone last fill was 02/08/2011, Please Advise on refills

## 2011-03-20 NOTE — Telephone Encounter (Signed)
Ok for scritps x 3

## 2011-03-22 ENCOUNTER — Ambulatory Visit (INDEPENDENT_AMBULATORY_CARE_PROVIDER_SITE_OTHER): Payer: Self-pay | Admitting: Internal Medicine

## 2011-03-22 VITALS — BP 128/78 | HR 89 | Temp 98.2°F | Wt 155.0 lb

## 2011-03-22 DIAGNOSIS — F411 Generalized anxiety disorder: Secondary | ICD-10-CM

## 2011-03-22 MED ORDER — DIAZEPAM 2 MG PO TABS: 2.0000 mg | ORAL_TABLET | Freq: Three times a day (TID) | ORAL | Status: DC

## 2011-03-22 NOTE — Progress Notes (Signed)
  Subjective:    Patient ID: [name], female    DOB: 11-30-1950, 60 y.o.   MRN: [id number]  HPI Presents for refill on valium. NO change in condition. Rx provided   Review of Systems     Objective:   Physical Exam        Assessment & Plan:

## 2011-04-17 ENCOUNTER — Other Ambulatory Visit: Payer: Self-pay | Admitting: *Deleted

## 2011-04-17 MED ORDER — PROMETHAZINE-CODEINE 6.25-10 MG/5ML PO SYRP: 5.0000 mL | ORAL_SOLUTION | ORAL | Status: DC | PRN

## 2011-04-17 NOTE — Telephone Encounter (Signed)
Pt c/o "cough is down in chest again" and request Rx refill for: Prometh/Codeine to [facility]. Please advise.

## 2011-04-17 NOTE — Telephone Encounter (Signed)
Done. Patient informed

## 2011-04-17 NOTE — Telephone Encounter (Signed)
OK for refill 8 oz, 1 additional refill.

## 2011-05-10 ENCOUNTER — Ambulatory Visit (INDEPENDENT_AMBULATORY_CARE_PROVIDER_SITE_OTHER)
Admission: RE | Admit: 2011-05-10 | Discharge: 2011-05-10 | Disposition: A | Discharge status: Home / Self Care | Payer: Self-pay | Source: Ambulatory Visit | Attending: Pulmonary Disease | Admitting: Pulmonary Disease

## 2011-05-10 ENCOUNTER — Ambulatory Visit (INDEPENDENT_AMBULATORY_CARE_PROVIDER_SITE_OTHER): Payer: Self-pay | Admitting: Pulmonary Disease

## 2011-05-10 ENCOUNTER — Encounter: Payer: Self-pay | Admitting: Pulmonary Disease

## 2011-05-10 ENCOUNTER — Telehealth: Payer: Self-pay | Admitting: Internal Medicine

## 2011-05-10 VITALS — BP 160/102 | HR 112 | Temp 98.8°F

## 2011-05-10 DIAGNOSIS — R05 Cough: Secondary | ICD-10-CM

## 2011-05-10 MED ORDER — BENZONATATE 100 MG PO CAPS: 200.0000 mg | ORAL_CAPSULE | Freq: Four times a day (QID) | ORAL | Status: DC | PRN

## 2011-05-10 MED ORDER — PREDNISONE 10 MG PO TABS: ORAL_TABLET | ORAL | Status: DC

## 2011-05-10 MED ORDER — AZITHROMYCIN 250 MG PO TABS: ORAL_TABLET | ORAL | Status: DC

## 2011-05-10 MED ORDER — HYDROCOD POLST-CPM POLST ER 10-8 MG PO CP12: 1.0000 | ORAL_CAPSULE | Freq: Two times a day (BID) | ORAL | Status: DC | PRN

## 2011-05-10 NOTE — Patient Instructions (Addendum)
Will check cxr today, and call you with results. See cyclical cough protocol.  Try and limit voice use even after finishing protocol. Will try and aggressively suppress cough per protocol Take aciphex one each day in the place of famotidine until the cough improves. Will treat with zpak in the event this represents pertussis. Will treat with a short course of prednisone for upper airway inflammation.  followup with Dr. Maple Hudson the first of next week, but call if not getting better.

## 2011-05-10 NOTE — Assessment & Plan Note (Signed)
The patient has had recent worsening of her chronic cough after what sounds like a viral/flulike illness.  She is currently having cough paroxysms which then begin to affect her breathing as her anxiety worsens.  She may even have a component of laryngospasm associated with her violent coughing.  Her lungs are totally clear to auscultation, but we'll check a chest x-ray today for completeness.  We'll also treat her empirically for Bordetella with a Z-Pak.  I would like to work on aggressive cough suppression with the cyclical call protocol, and will also treat for reflux (the pt is having some regurgitation with paroxysms). Will also give her a short course of prednisone for her severe upper airway irritation/inflammation.

## 2011-05-10 NOTE — Telephone Encounter (Signed)
Spoke with pt. She states having increased SOB and cough x 4 days. Breathing is worse when lies down and with exertion. CDY has no openings, pt states will see any provider- I d/w CDY and he is fine with pt seeing KC, who has openings this pm. OV with KC at 3:15 today.
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
## 2011-05-12 ENCOUNTER — Telehealth: Payer: Self-pay | Admitting: Internal Medicine

## 2011-05-12 NOTE — Telephone Encounter (Signed)
Notes Recorded by Barbaraann Share, MD on 05/10/2011 at 5:47 PM Please let pt know that cxr is clear.  I spoke with patient about results and she verbalized understanding and had no questions

## 2011-05-15 ENCOUNTER — Ambulatory Visit (INDEPENDENT_AMBULATORY_CARE_PROVIDER_SITE_OTHER): Payer: Self-pay | Admitting: Internal Medicine

## 2011-05-15 VITALS — BP 182/102 | HR 97 | Ht 66.0 in | Wt 168.2 lb

## 2011-05-15 DIAGNOSIS — R030 Elevated blood-pressure reading, without diagnosis of hypertension: Secondary | ICD-10-CM

## 2011-05-15 DIAGNOSIS — J40 Bronchitis, not specified as acute or chronic: Secondary | ICD-10-CM

## 2011-05-15 DIAGNOSIS — K219 Gastro-esophageal reflux disease without esophagitis: Secondary | ICD-10-CM

## 2011-05-15 MED ORDER — COMPRESSOR/NEBULIZER MISC: Status: DC

## 2011-05-15 MED ORDER — IPRATROPIUM-ALBUTEROL 0.5-2.5 (3) MG/3ML IN SOLN: 3.0000 mL | RESPIRATORY_TRACT | Status: DC | PRN

## 2011-05-15 NOTE — Progress Notes (Signed)
  Subjective:    Patient ID: [name], female    DOB: 07-04-1950, 61 y.o.   MRN: [id number]  HPI [name] is seen acutely for hypertension. While in the pulmonary office to see Dr. Maple Hudson she had BP 170/100 and asymptomatic. She reports that she has had nausea and vomiting all nigh. She denies HA, double vision, focal symptoms of any kind. She does not have a h/o hypertension.  I have reviewed the patient's medical history in detail and updated the computerized patient record.    Review of Systems System review is negative for any constitutional, cardiac, pulmonary, GI or neuro symptoms or complaints other than as described in the HPI.     Objective:   Physical Exam Afebrile, BP 162/90 Gen'l- WNWD white woman HEENT C&S clear, PERRLA, fundi - normal disk margin, no hemorrhage, visual acuity normal Cor - 2+ radial , RRR PUlm - persistent cough, no wheeze Neuro - A&O x 3, CN II-XII normal, normal gait.       Assessment & Plan:  Acute hypertension - no evidence on exam of malignant hypertension. BP is lower than 3o minutes ago. Suspect elevated BP related to N/V and lack of sleep.  Plan - no acute antihypertensive meds          For Nausea - phenergan 25 mg q 6 - Rx called to [facility]
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
## 2011-05-15 NOTE — Patient Instructions (Addendum)
Try changing pepcid to omeprazole 20 mg otc twice daily before meals  Elevate the head of your bed with a brick under each leg.   Scripts for home nebulizer and medication

## 2011-05-18 ENCOUNTER — Encounter: Payer: Self-pay | Admitting: Internal Medicine

## 2011-05-18 NOTE — Assessment & Plan Note (Signed)
Educated again about the interaction between cough and reflux as a cyclical phenomenon.

## 2011-05-18 NOTE — Assessment & Plan Note (Addendum)
Cyclical cough pattern, probably with a recent viral pattern acute bronchitis superimposed. She gives strong impression that there is important underlying anxiety and depression. Her symptoms don't clearly associate with objective findings.  I do think there is probably a reflux component.  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

## 2011-05-22 ENCOUNTER — Ambulatory Visit: Payer: Self-pay | Admitting: Internal Medicine

## 2011-06-08 ENCOUNTER — Telehealth: Payer: Self-pay | Admitting: Internal Medicine

## 2011-06-08 NOTE — Telephone Encounter (Signed)
Spoke with pt. She states she has found a neb machine that she likes at [employer]. I advised that we gave her rx for neb machine at last ov and should take this to her pharmacy and get the machine. Pt states looked in her purse and found her rx and will take this to pharmacy. She states nothing further needed.

## 2011-06-21 ENCOUNTER — Ambulatory Visit (INDEPENDENT_AMBULATORY_CARE_PROVIDER_SITE_OTHER): Payer: 59 | Admitting: Internal Medicine

## 2011-06-21 DIAGNOSIS — F411 Generalized anxiety disorder: Secondary | ICD-10-CM

## 2011-06-21 MED ORDER — METHADONE HCL 10 MG PO TABS: ORAL_TABLET | ORAL | Status: DC

## 2011-06-21 MED ORDER — METHADONE HCL 10 MG PO TABS: 10.0000 mg | ORAL_TABLET | Freq: Three times a day (TID) | ORAL | Status: DC

## 2011-07-06 ENCOUNTER — Other Ambulatory Visit: Payer: Self-pay | Admitting: Internal Medicine

## 2011-07-06 NOTE — Telephone Encounter (Signed)
Request for diazepam [last script showing 11.28.12 #150x5]; called [facility], last Rx submitted by patient was dated December 15, 2010 [x5 RF]. Request for Promethazine refill, pt c/o migraines.

## 2011-07-07 ENCOUNTER — Other Ambulatory Visit: Payer: Self-pay

## 2011-07-07 ENCOUNTER — Telehealth: Payer: Self-pay | Admitting: Internal Medicine

## 2011-07-07 MED ORDER — PROMETHAZINE HCL 12.5 MG PO TABS: 12.5000 mg | ORAL_TABLET | Freq: Four times a day (QID) | ORAL | Status: DC | PRN

## 2011-07-07 NOTE — Telephone Encounter (Signed)
Rx refilled.

## 2011-07-07 NOTE — Telephone Encounter (Signed)
The pt is requesting phenergan 12.5 mg called into the pharmacy [facility]) for migraine.  She states she has had a migraine for 10 days.  She was offered an appointment with Dr.Leschber, but stated because of her pain, she is unable to come in.

## 2011-07-07 NOTE — Telephone Encounter (Signed)
Rx faxed to pharmacy  

## 2011-07-07 NOTE — Telephone Encounter (Signed)
Ok for phenergan 12.5 mg tabs, #60, 1 po q 6 prn, no refills Thanks

## 2011-07-13 ENCOUNTER — Ambulatory Visit: Payer: Self-pay | Admitting: Internal Medicine

## 2011-07-17 ENCOUNTER — Telehealth: Payer: Self-pay | Admitting: Internal Medicine

## 2011-07-17 MED ORDER — DOXYCYCLINE HYCLATE 100 MG PO TABS: ORAL_TABLET | ORAL | Status: DC

## 2011-07-17 NOTE — Telephone Encounter (Signed)
I spoke with pt and is aware of CDY recs. She voiced her understanding and rx has been sent to the pharmacy. Nothing further was needed 

## 2011-07-17 NOTE — Telephone Encounter (Signed)
Per CY-give Doxycycline 100 mg #8 take 2 today then 1 daily no refills and Mucinex DM.

## 2011-07-17 NOTE — Telephone Encounter (Signed)
I spoke with pt and she c/o cough w/ yellow phlem, increase SOB,chest rattling, sore throat, chills, sweats, neck hurting, chest is hurting from all the coughing x Friday. Denies any fever, nausea, vomiting. Pt is requesting to be worked in today. Please advise Dr. Maple Hudson, thanks  Allergies  Allergen Reactions  . Influenza Vaccine Live   . Acetaminophen   . Afluria Preservative Free   . Divalproex Sodium   . Hydrocodone-Homatropine     REACTION: unspecified  . Phenazopyridine Hcl   . Sulfonamide Derivatives

## 2011-07-19 ENCOUNTER — Encounter: Payer: Self-pay | Admitting: Pulmonary Disease

## 2011-07-19 ENCOUNTER — Telehealth: Payer: Self-pay | Admitting: *Deleted

## 2011-07-19 ENCOUNTER — Ambulatory Visit (INDEPENDENT_AMBULATORY_CARE_PROVIDER_SITE_OTHER): Payer: Self-pay | Admitting: Pulmonary Disease

## 2011-07-19 ENCOUNTER — Ambulatory Visit
Admission: RE | Admit: 2011-07-19 | Discharge: 2011-07-19 | Disposition: A | Discharge status: Home / Self Care | Payer: Self-pay | Source: Ambulatory Visit | Attending: Pulmonary Disease | Admitting: Pulmonary Disease

## 2011-07-19 VITALS — BP 160/60 | HR 97 | Temp 99.6°F | Ht 66.0 in | Wt 167.4 lb

## 2011-07-19 DIAGNOSIS — R509 Fever, unspecified: Secondary | ICD-10-CM

## 2011-07-19 DIAGNOSIS — R059 Cough, unspecified: Secondary | ICD-10-CM

## 2011-07-19 DIAGNOSIS — J40 Bronchitis, not specified as acute or chronic: Secondary | ICD-10-CM

## 2011-07-19 DIAGNOSIS — R053 Chronic cough: Secondary | ICD-10-CM

## 2011-07-19 DIAGNOSIS — R05 Cough: Secondary | ICD-10-CM

## 2011-07-19 MED ORDER — BENZONATATE 100 MG PO CAPS: 200.0000 mg | ORAL_CAPSULE | Freq: Four times a day (QID) | ORAL | Status: DC | PRN

## 2011-07-19 MED ORDER — PREDNISONE 10 MG PO TABS: ORAL_TABLET | ORAL | Status: DC

## 2011-07-19 MED ORDER — LEVOFLOXACIN 750 MG PO TABS: 750.0000 mg | ORAL_TABLET | Freq: Every day | ORAL | Status: DC

## 2011-07-19 NOTE — Progress Notes (Signed)
  Subjective:    Patient ID: [name], female    DOB: 02-11-1951, 61 y.o.   MRN: [id number]  HPI The patient comes in today for an acute sick visit.  She gives a one-week history of worsening cough, congestion, and significant sinus pressure area and she also has increased shortness of breath.  Her cough has been unrelenting, and she is unable to bring up mucus out of her chest though she feels there is rattling there.  She also hears crackles and pops in the center of her chest whenever she lies down quietly.  She has had a temperature as high as 101.  She also has significant sinus pressure and is blowing purulent material from her nose.   Review of Systems  Constitutional: Positive for fever. Negative for unexpected weight change.  HENT: Positive for ear pain, congestion, sore throat, sneezing, postnasal drip and sinus pressure. Negative for nosebleeds, rhinorrhea, trouble swallowing and dental problem.   Eyes: Negative for redness and itching.  Respiratory: Positive for cough, chest tightness and shortness of breath. Negative for wheezing.   Cardiovascular: Positive for chest pain. Negative for palpitations and leg swelling.  Gastrointestinal: Positive for nausea and vomiting.  Genitourinary: Negative for dysuria.  Musculoskeletal: Negative for joint swelling.  Skin: Negative for rash.  Neurological: Positive for headaches.  Hematological: Does not bruise/bleed easily.  Psychiatric/Behavioral: Negative for dysphoric mood. The patient is not nervous/anxious.        Objective:   Physical Exam Obese female in no acute distress Nose without purulence or discharge noted Oropharynx clear without exudates Chest very difficult to examine due to incessant coughing, no definite wheezing Cardiac exam with regular rate and rhythm Lower extremities without edema, no cyanosis Alert and oriented, moves all 4 extremities.       Assessment & Plan:

## 2011-07-19 NOTE — Assessment & Plan Note (Signed)
The patient gives a history for chest congestion, cough with inability to produce mucus, and worsening shortness of breath over the last one week.  She has taken a course of doxycycline with no improvement.  She has had fever documented on one occasion per her history.  Her cough is leading to chest soreness and inability to sleep.  I will treat her with a broader spectrum antibiotic, as well as a course of prednisone.  I have also asked her to take her cough syrup at home on a more regular basis in order to prevent cyclical coughing.  The patient will call should she not see substantial improvement over the next few days.

## 2011-07-19 NOTE — Telephone Encounter (Signed)
CY gave the okay for an appointment w/ [facility] today.  Called pt & advised her of the appointment.  Pt stated she understood & that nothing further is needed at this time. Antionette Fairy

## 2011-07-19 NOTE — Patient Instructions (Signed)
Will treat with levaquin one a day for 5 days Prednisone taper per prescription Use your narcotic cough syrup at home on a consistent basis for the next 3-4 days Can try tessalon pearls 100mg  2 every 6hrs if needed for cough Let us know if your cough is not getting better.

## 2011-07-26 ENCOUNTER — Telehealth: Payer: Self-pay | Admitting: Internal Medicine

## 2011-07-26 NOTE — Telephone Encounter (Signed)
I spoke with pt and she states she is not better from her visit with [facility] on 07/19/11. She c/o constant cough w/ yellow phlem, nasal congestion, sore throat, PND, fatigue, blowing out light yellow phlem, wheezing, chest tx, still some SOB x 10 days. Pt has finished Levaquin and pred taper. She is still taking her tessalon pearls and mucinex dm. Pt is requesting further recs from CDY. Also she is wanting to know if allergies could be contributing to this. Please advise Dr. Maple Hudson, thanks  Allergies  Allergen Reactions  . Influenza Vaccine Live   . Acetaminophen   . Afluria Preservative Free   . Divalproex Sodium   . Hydrocodone-Homatropine     REACTION: unspecified  . Phenazopyridine Hcl   . Sulfonamide Derivatives

## 2011-07-26 NOTE — Telephone Encounter (Signed)
I spoke with CDY and he stated okay for pt to see TP tomorrow. Pt is scheduled to come in at 10:15 for an evaluation. Also per CDY he wanted to see pt soon as well. Pt already has an apt for 08/17/11 at 2:00 and i advised pt to make sure she keeps that apt. She voiced her understanding and needed nothing further. Pt aware if she worsens then to seek emergency care.

## 2011-07-26 NOTE — Telephone Encounter (Signed)
Ok to work her in as able. Ok to try regular use of an antihistamine like Allegra

## 2011-07-27 ENCOUNTER — Ambulatory Visit (INDEPENDENT_AMBULATORY_CARE_PROVIDER_SITE_OTHER)
Admission: RE | Admit: 2011-07-27 | Discharge: 2011-07-27 | Disposition: A | Discharge status: Home / Self Care | Payer: Self-pay | Source: Ambulatory Visit | Attending: Adult Health | Admitting: Adult Health

## 2011-07-27 ENCOUNTER — Ambulatory Visit (INDEPENDENT_AMBULATORY_CARE_PROVIDER_SITE_OTHER): Payer: Self-pay | Admitting: Adult Health

## 2011-07-27 ENCOUNTER — Encounter: Payer: Self-pay | Admitting: Adult Health

## 2011-07-27 VITALS — BP 144/86 | HR 88 | Temp 98.3°F | Ht 66.0 in | Wt 166.7 lb

## 2011-07-27 DIAGNOSIS — J40 Bronchitis, not specified as acute or chronic: Secondary | ICD-10-CM

## 2011-07-27 DIAGNOSIS — R05 Cough: Secondary | ICD-10-CM

## 2011-07-27 MED ORDER — PROMETHAZINE-CODEINE 6.25-10 MG/5ML PO SYRP: 5.0000 mL | ORAL_SOLUTION | ORAL | Status: DC | PRN

## 2011-07-27 NOTE — Patient Instructions (Addendum)
Begin Omeprazole 20mg  daily before meal  Take Pepcid 20mg  At bedtime   Delsym 2 tsp Twice daily  For cough  Tessalon Three times a day  For cough  Phenergan W/ Codeine 1-2 tsp every 4-6 hr As needed  Cough . -this may make you sleepy  Hold Fish oil for now.  Use sips of water to avoid coughing --Goal is to be cough free so try to not cough -Get ahead of the cough  No mints  Chlorphenaramine 4mg  1 in am , 1 around 2 pm and then 2 at bedtime -this is to prevent nasal drainage in throat.  Voice rest , no talking , no whispering for next 3 days.  Follow up Dr. Maple Hudson  In 2-3 weeks as planned and As needed   Please contact office for sooner follow up if symptoms do not improve or worsen or seek emergency care
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
## 2011-08-17 ENCOUNTER — Encounter: Payer: Self-pay | Admitting: Internal Medicine

## 2011-08-17 ENCOUNTER — Ambulatory Visit (INDEPENDENT_AMBULATORY_CARE_PROVIDER_SITE_OTHER): Payer: Self-pay | Admitting: Internal Medicine

## 2011-08-17 VITALS — BP 124/84 | HR 89 | Ht 66.0 in | Wt 166.0 lb

## 2011-08-17 DIAGNOSIS — R05 Cough: Secondary | ICD-10-CM

## 2011-08-17 MED ORDER — BENZONATATE 100 MG PO CAPS: 200.0000 mg | ORAL_CAPSULE | Freq: Four times a day (QID) | ORAL | Status: DC | PRN

## 2011-08-17 MED ORDER — PROMETHAZINE-CODEINE 6.25-10 MG/5ML PO SYRP: 5.0000 mL | ORAL_SOLUTION | ORAL | Status: AC | PRN
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
## 2011-08-17 NOTE — Patient Instructions (Signed)
Strongly suggest you return to what was working-  Refill scripts printed for cough syrup and for benzonatate. Keep up the active acid blocker therapy.  Be quick to resume the twice daily chlorpheniramine antihistamine if needed.

## 2011-08-20 NOTE — Assessment & Plan Note (Signed)
Plan-resume omeprazole 20 mg twice a day, cough suppression with benzonatate and Phenergan with codeine.

## 2011-09-26 ENCOUNTER — Other Ambulatory Visit: Payer: Self-pay

## 2011-09-26 MED ORDER — METHADONE HCL 10 MG PO TABS: ORAL_TABLET | ORAL | Status: DC

## 2011-09-27 NOTE — Telephone Encounter (Signed)
Pt informed, Rx in cabinet for pt pick up  

## 2011-10-05 ENCOUNTER — Other Ambulatory Visit (INDEPENDENT_AMBULATORY_CARE_PROVIDER_SITE_OTHER): Payer: Self-pay

## 2011-10-05 ENCOUNTER — Ambulatory Visit (INDEPENDENT_AMBULATORY_CARE_PROVIDER_SITE_OTHER): Payer: Self-pay | Admitting: Internal Medicine

## 2011-10-05 ENCOUNTER — Encounter: Payer: Self-pay | Admitting: Internal Medicine

## 2011-10-05 VITALS — BP 178/110 | HR 103 | Temp 98.6°F | Wt 164.5 lb

## 2011-10-05 DIAGNOSIS — R635 Abnormal weight gain: Secondary | ICD-10-CM

## 2011-10-05 DIAGNOSIS — K219 Gastro-esophageal reflux disease without esophagitis: Secondary | ICD-10-CM

## 2011-10-05 DIAGNOSIS — E039 Hypothyroidism, unspecified: Secondary | ICD-10-CM

## 2011-10-05 DIAGNOSIS — R05 Cough: Secondary | ICD-10-CM

## 2011-10-05 DIAGNOSIS — E781 Pure hyperglyceridemia: Secondary | ICD-10-CM

## 2011-10-05 DIAGNOSIS — IMO0001 Reserved for inherently not codable concepts without codable children: Secondary | ICD-10-CM

## 2011-10-05 LAB — COMPREHENSIVE METABOLIC PANEL
ALT: 146 U/L — ABNORMAL HIGH (ref 0–35)
AST: 132 U/L — ABNORMAL HIGH (ref 0–37)
CO2: 30 mEq/L (ref 19–32)
Creatinine, Ser: 1.1 mg/dL (ref 0.4–1.2)
Sodium: 138 mEq/L (ref 135–145)
Total Bilirubin: 0.6 mg/dL (ref 0.3–1.2)
Total Protein: 7.6 g/dL (ref 6.0–8.3)

## 2011-10-05 LAB — HEPATIC FUNCTION PANEL
ALT: 146 U/L — ABNORMAL HIGH (ref 0–35)
AST: 132 U/L — ABNORMAL HIGH (ref 0–37)
Albumin: 3.9 g/dL (ref 3.5–5.2)
Alkaline Phosphatase: 181 U/L — ABNORMAL HIGH (ref 39–117)
Bilirubin, Direct: 0 mg/dL (ref 0.0–0.3)
Total Protein: 7.6 g/dL (ref 6.0–8.3)

## 2011-10-05 LAB — LIPID PANEL
Cholesterol: 481 mg/dL — ABNORMAL HIGH (ref 0–200)
Total CHOL/HDL Ratio: 12
Triglycerides: 697 mg/dL — ABNORMAL HIGH (ref 0.0–149.0)
VLDL: 139.4 mg/dL — ABNORMAL HIGH (ref 0.0–40.0)

## 2011-10-05 LAB — CBC WITH DIFFERENTIAL/PLATELET
Basophils Relative: 0.5 % (ref 0.0–3.0)
Eosinophils Relative: 1.3 % (ref 0.0–5.0)
HCT: 39.4 % (ref 36.0–46.0)
Lymphs Abs: 2.3 10*3/uL (ref 0.7–4.0)
MCV: 85.8 fl (ref 78.0–100.0)
Monocytes Absolute: 0.6 10*3/uL (ref 0.1–1.0)
Monocytes Relative: 7.8 % (ref 3.0–12.0)
Neutrophils Relative %: 61.3 % (ref 43.0–77.0)
Platelets: 313 10*3/uL (ref 150.0–400.0)
RBC: 4.59 Mil/uL (ref 3.87–5.11)
WBC: 7.8 10*3/uL (ref 4.5–10.5)

## 2011-10-05 MED ORDER — METHADONE HCL 10 MG PO TABS: ORAL_TABLET | ORAL | Status: DC

## 2011-10-05 MED ORDER — FUROSEMIDE 40 MG PO TABS: 40.0000 mg | ORAL_TABLET | Freq: Every day | ORAL | Status: DC

## 2011-10-05 MED ORDER — GABAPENTIN 300 MG PO CAPS: 300.0000 mg | ORAL_CAPSULE | Freq: Every day | ORAL | Status: DC

## 2011-10-05 NOTE — Patient Instructions (Addendum)
Blood pressure - intermittently elevated. Plan - start furosemide 40 mg once a day.  Painful feet and loss of sensation - labs ordered today including chemistries and B12. For relief will have you increase gabapentin 300 mg to twice a day and after 3-5 days if not much better you can go to 3 times a day.  Excessive thirst and question of water retention - will check kidney function and sodium/potassium today. You can dilute down you sodium to the point of being symptomatic. Will also check blood sugar.  Loss of sensation in the distal right Lower extremity - odd presentation. Normal Neuro exam except for sensation. Will check labs as above.  Weight gain - could be excessive fluid. By your record your weight has been stable.   Fibromyalgia and chronic pain - will continue methadone.
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
## 2011-10-06 MED ORDER — METHADONE HCL 10 MG PO TABS: ORAL_TABLET | ORAL | Status: DC

## 2011-10-08 ENCOUNTER — Encounter: Payer: Self-pay | Admitting: Internal Medicine

## 2011-10-08 NOTE — Assessment & Plan Note (Signed)
Continues to have a chronic cough although she does admit that it is marginally better.  Plan - no further w/u at this time.

## 2011-10-08 NOTE — Assessment & Plan Note (Signed)
Chronically elevated Tgy  - on gemfibrozil. LFTs mildly elevated - 3 x normal. Had negative hep serologies Oct '11  Plan D/c/ gemfibrozil  Start fish oil and a strict low fat diet to control triglycerides.

## 2011-10-08 NOTE — Assessment & Plan Note (Signed)
Thyroid functions are normal

## 2011-10-08 NOTE — Assessment & Plan Note (Signed)
difficult to sort out her symptoms of pain: all over but worse at feet. Many other potential cause for pain including DDD. Lyrica and cymbalta are too pricey for her budget.  Plan -  Gabapentin starting low and titrating up to a therapeutic level = 300 mg tid.

## 2011-10-08 NOTE — Assessment & Plan Note (Signed)
Despite patient's claim the data in EMR shows her weight to be stable over the past many months. Lab with no evidence of dilutional effect: Na and K normal. Thyrroid function is normal.

## 2011-10-08 NOTE — Assessment & Plan Note (Signed)
Recurrent elevation in LFTs. No acute illness to explain, although she does have a set of unusual symptoms.  Plan - d/c/ gemfibrozil.  Recheck lft's in 6 - 8 weeks.

## 2011-10-09 ENCOUNTER — Telehealth: Payer: Self-pay | Admitting: Internal Medicine

## 2011-10-09 NOTE — Telephone Encounter (Signed)
Caller: [name]/Patient; PCP: Illene Regulus; CB#: [phone]; Call regarding Pain Meds Not Helping for Foot Pain, Feels Like Pins and Needles On Soles of Feet "at least a week"; , Bronchitis, 3rd Day Into Migraine rated at 7 of 10, Wants To Know If She Was Tested for Diabetes; Methadone and Gabapentin at hs for headache - worst sx is numbness/tingling in feet, balance problems onset 10/06/11.  States she was evaluated in office on 10/05/11 for same.  Emergent sx ruled out.  Follow up with provider in 72 hours per Neurological Deficits protocol.  Caller requests to wait until 10/12/11 for appointment and states plan to follow up with office on 10/10/11.

## 2011-10-19 ENCOUNTER — Telehealth: Payer: Self-pay | Admitting: Internal Medicine

## 2011-10-19 DIAGNOSIS — E785 Hyperlipidemia, unspecified: Secondary | ICD-10-CM

## 2011-10-19 NOTE — Telephone Encounter (Signed)
1. Mail another copy of the lab letter 2. Bad cholesterol LDL was OK at 129, good cholesterol was fine. 3. Liver functions were elevated. She should stop taking gemfibrozil. Repeat lab 4 weeks later 4. Thyroid function is oK 5. Cannot find phone note re podiatric referral. - Recommend Dr. Harriet Pho

## 2011-10-19 NOTE — Telephone Encounter (Signed)
Pt states she has never received her lab results and has been waiting to hear about them. Informed pt per [facility] a letter was mailed, states she never received it and would like a call because she has a lot of questions. For example, does she need to go back on cholesterol meds based on her results and questions about her thyroid, etc. Pt also states she called a week ago concerning getting a recommendation for a Podiatrist.

## 2011-10-23 ENCOUNTER — Telehealth: Payer: Self-pay | Admitting: Internal Medicine

## 2011-10-23 DIAGNOSIS — R202 Paresthesia of skin: Secondary | ICD-10-CM

## 2011-10-23 NOTE — Telephone Encounter (Signed)
Series of normal serum glucose. No indication for A1C but ok and will order.

## 2011-10-23 NOTE — Telephone Encounter (Signed)
Caller: Jinnifer/Mother; PCP: Illene Regulus; CB#: [phone];  Call regarding: Pt Has Several Questions;  Pt calling with several questions after receiving her lab work results in the mail on 6/28.  1. Total cholesteral was over 400 and this concerns her. She realizes her LDL is 139, but still elevated. Pt has not been taking med for high cholesterol in 3 months. Wants to know if she should start taking cholesterol med again? 2. Pt is now concerned that LFT's were elevated. Explained MD does not want her taking cholesterol med b/c of elevated LFT's. Hold off for repeat labs in 2 more weeks.  3. Pt still having terrible foot pain bilaterally- on the soles of feet to ankle. Pt never rec'd f/u call on 6/27, so triager gave her the name of Podiatrist reccommended by Dr. Debby Bud.  4. Pt requests an A1C when labs drawn in 2 week for f/u lab draw per MD order. Pt concerned that she has excessive thirst and "neuropathy" in feet. Explained to pt that glucose on 6/13 was normal. Pt would still like an A1C. Transferred pt for Lab appt. in 2 weeks. Recommended fasting labs.  Pt requests a f/u call regarding her concerns after MD opinion. Best call back # above.

## 2011-10-23 NOTE — Telephone Encounter (Signed)
There is some room for concern about the tryglycerides - no signs of pancreatitis. Will repeat labs as ordered. She may need to be referred to lipid clinic for management since she is intolerant of statins.  Glucose levels have been normal.. No need to repeat.

## 2011-10-23 NOTE — Telephone Encounter (Signed)
Patient continue to be concerned of lab values. Did receive lab results in mail. Concern of cholesterol # 481 and Triglyceride 697 and will come in 2 weeks to repeat lab work. Request to be checked for diabetes also.

## 2011-10-24 NOTE — Telephone Encounter (Signed)
My misunderstanding. I thought she was taking lopid at the time of her last office visit and that lopid was a culprit in the elevation of her liver functions.  Plan - she is too come in now for lab to screen for causes of elevated liver functions. She does not need a lipid panel.

## 2011-10-24 NOTE — Telephone Encounter (Signed)
Pt advised of A1C but is still concerned about cholesterol. Pt says she has been of the Lopid for 3 months at least, Should she still wait to repeat lipids or can she come on now? Please advise

## 2011-10-25 NOTE — Telephone Encounter (Signed)
Patient notified of need to do lab work . Patient states will come on Friday morning for lab work that is ordered

## 2011-10-27 ENCOUNTER — Other Ambulatory Visit (INDEPENDENT_AMBULATORY_CARE_PROVIDER_SITE_OTHER): Payer: Self-pay

## 2011-10-27 DIAGNOSIS — R209 Unspecified disturbances of skin sensation: Secondary | ICD-10-CM

## 2011-10-27 DIAGNOSIS — R202 Paresthesia of skin: Secondary | ICD-10-CM

## 2011-10-27 LAB — HEPATITIS B SURFACE ANTIBODY,QUALITATIVE: Hep B S Ab: NEGATIVE

## 2011-10-27 LAB — HEPATIC FUNCTION PANEL
AST: 33 U/L (ref 0–37)
Albumin: 4.1 g/dL (ref 3.5–5.2)
Alkaline Phosphatase: 134 U/L — ABNORMAL HIGH (ref 39–117)
Bilirubin, Direct: 0 mg/dL (ref 0.0–0.3)
Total Bilirubin: 0.6 mg/dL (ref 0.3–1.2)

## 2011-10-27 LAB — HEPATITIS C ANTIBODY: HCV Ab: NEGATIVE

## 2011-10-28 LAB — HEPATITIS B CORE ANTIBODY, TOTAL: Hep B Core Total Ab: NEGATIVE

## 2011-10-30 ENCOUNTER — Encounter: Payer: Self-pay | Admitting: Internal Medicine

## 2011-10-31 NOTE — Telephone Encounter (Signed)
i believe repeat lipid panel was ordered July 1 but I don't know how to check. If not ordered it may be ordered.

## 2011-10-31 NOTE — Telephone Encounter (Signed)
Caller: [name]; PCP: Illene Regulus; CB#: [phone]; Call regarding Liver Enzymes Elevated, Seen 10/27/11; Questions Regarding Cholesterol Test; States she spoke with 3 different nurses and one of them told her the blood is viable for 7 days; she requests to have cholesterol checked; verified that she had been off cholesterol lowering medications at least 3 months prior to last test and has concerns that with cholesterol 481.  She relates everyone in her family has had heart attack or surgery. Requests repeat test to be done.  Information noted and sent to provider for follow up. PCP Calls protocol.

## 2011-11-01 LAB — LIPID PANEL
HDL: 45.5 mg/dL (ref >39.00)
Total CHOL/HDL Ratio: 8
VLDL: 51.4 mg/dL — ABNORMAL HIGH (ref 0.0–40.0)

## 2011-11-01 NOTE — Telephone Encounter (Signed)
ADD ON LAB FORM SENT TO LAB FOR LIPID PANEL TO BE DONE ON EXISTING BLOOD SAMPLE IN LAB

## 2011-11-05 ENCOUNTER — Encounter: Payer: Self-pay | Admitting: Internal Medicine

## 2011-11-05 DIAGNOSIS — E781 Pure hyperglyceridemia: Secondary | ICD-10-CM

## 2011-11-13 ENCOUNTER — Telehealth: Payer: Self-pay | Admitting: Internal Medicine

## 2011-11-13 NOTE — Telephone Encounter (Signed)
May put on my schedule tomorrow.

## 2011-11-13 NOTE — Telephone Encounter (Signed)
Caller: [name]/Patient; PCP: Illene Regulus; CB#: [phone]; ; ; Call regarding Hypertension;  11-11-11 she said her BP has been running high, has been under a lot of stress she said she passed out because she woke up on the floor, her reading that day was 190/114,  since then readings are ranging from 160's/ 100's  one reading was 189/94.  Today 7-22 most recent reading 168/83  is not having any chest pain or shortness of breath.  No sxs at this time.  All emergent sxs per Hypertension Diagnosed or Suspected R/O except for multiple elevated blood pressure readings without other sxs and no previous workup  identified   Home care advice given and appt made for 7-23 at 1045 with Dr Everardo All

## 2011-11-14 ENCOUNTER — Ambulatory Visit (INDEPENDENT_AMBULATORY_CARE_PROVIDER_SITE_OTHER): Payer: Self-pay | Admitting: Internal Medicine

## 2011-11-14 ENCOUNTER — Encounter: Payer: Self-pay | Admitting: Internal Medicine

## 2011-11-14 VITALS — BP 164/92 | HR 85 | Temp 98.5°F | Resp 16 | Wt 164.0 lb

## 2011-11-14 DIAGNOSIS — R030 Elevated blood-pressure reading, without diagnosis of hypertension: Secondary | ICD-10-CM

## 2011-11-14 MED ORDER — GEMFIBROZIL 600 MG PO TABS: 600.0000 mg | ORAL_TABLET | Freq: Two times a day (BID) | ORAL | Status: DC

## 2011-11-14 MED ORDER — CLONIDINE HCL 0.1 MG PO TABS: 0.1000 mg | ORAL_TABLET | Freq: Three times a day (TID) | ORAL | Status: DC
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
## 2011-11-14 NOTE — Patient Instructions (Addendum)
Excursions of blood pressure - limited exam today is normal.  Plan - clonidine 0.1 mg every 4 hrs as needed for Blood pressure greater than 160 systolic or 100 diastolic.            If more than 4 doses per day needed on a regular basis will start schedule blood pressure medication.  Cholesterol -  Lab Results  Component Value Date   CHOL 351* 10/27/2011   HDL 45.50 10/27/2011   LDLDIRECT 150.8 10/27/2011   TRIG 257.0* 10/27/2011   CHOLHDL 8 10/27/2011   Triglycerides:  751    976   697   257     The first two readings were in '10  total is high but the HDL (good cholesterol) is ok (greater than 39), the LDL is fair -should be less than 130, the triglycerides are very high and need treatment  Plan  Resume the lopid 600 mg twice a day.

## 2011-11-14 NOTE — Assessment & Plan Note (Signed)
July '13 - having excursions of high blood pressure that is new. She generally is well controlled but has had readings to 190/114.  Plan - clonidine 0.1 mg every 4 hrs as needed for Blood pressure greater than 160 systolic or 100 diastolic.            If more than 4 doses per day needed on a regular basis will start schedule blood pressure medication.

## 2011-11-15 ENCOUNTER — Other Ambulatory Visit: Payer: Self-pay | Admitting: *Deleted

## 2011-11-15 NOTE — Telephone Encounter (Signed)
RX CLONIDINE FAXED TO PHARMACY

## 2011-12-11 ENCOUNTER — Telehealth: Payer: Self-pay | Admitting: Internal Medicine

## 2011-12-11 NOTE — Telephone Encounter (Signed)
Caller: [name]/Patient; Patient Name: [name]; PCP: Illene Regulus; Best Callback Phone Number: [phone]; pt calling and states that she has pain in lower pelvic area; increase in urinary frequency; symptoms started 12/04/11; no fever; Triaged per Urinary Symptoms-Female Guideline; See in 4 hours due to urinary tract symptoms and low back pain; pt is refusing appointment for today; she understands see in 4 hr disposition but she has a migrane and doesn't want to come in today; requesting appointment tomorrow after 1:00pm; appointment made for 12/12/11 at 3:45pm Dr Debby Bud; will comply

## 2011-12-12 ENCOUNTER — Ambulatory Visit (INDEPENDENT_AMBULATORY_CARE_PROVIDER_SITE_OTHER): Payer: Self-pay | Admitting: Internal Medicine

## 2011-12-12 ENCOUNTER — Encounter: Payer: Self-pay | Admitting: Internal Medicine

## 2011-12-12 VITALS — BP 158/70 | HR 79 | Temp 98.1°F | Resp 16 | Wt 160.0 lb

## 2011-12-12 DIAGNOSIS — N3090 Cystitis, unspecified without hematuria: Secondary | ICD-10-CM

## 2011-12-12 MED ORDER — METHADONE HCL 10 MG PO TABS: ORAL_TABLET | ORAL | Status: DC

## 2011-12-12 MED ORDER — DIAZEPAM 2 MG PO TABS: ORAL_TABLET | ORAL | Status: DC

## 2011-12-12 MED ORDER — CEFUROXIME AXETIL 250 MG PO TABS: 250.0000 mg | ORAL_TABLET | Freq: Two times a day (BID) | ORAL | Status: AC
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
## 2011-12-21 ENCOUNTER — Ambulatory Visit: Payer: Self-pay | Admitting: Internal Medicine

## 2011-12-21 ENCOUNTER — Telehealth: Payer: Self-pay | Admitting: Internal Medicine

## 2011-12-21 NOTE — Telephone Encounter (Signed)
Caller: [name]/Patient; Patient Name: [name]; PCP: Illene Regulus (Adults only); Best Callback Phone Number: [phone] (1) Patient states she has had UTI for two weeks 12/07/11.  Seen in the office last week 12/12/11  by Dr. Debby Bud and prescribed antibiotic with  no relief.  +urgency, slight burning, pelvic pain but feels better when she urinates and then pain returns.  She felt as if she was getting better but has not improved . Requesting a different medication .  She is able to urinate. Unknown fever, back pain and pelvic pain.  Emergent s/sx ruled out per Flank pain protocol with exception to Flank pain or low back pain and urinary tract symptoms.  See Provider in 4 hours.  (2) Migraine. (Onset since age of 61 years of age.)  Pain with moving ,Vomiting,  light sensitive  and symptoms of typical migriane ongoing X 3 days. Emergent s/sx ruled out per Headache Protocol  with exception to " New onset of vomiting" . See Provider in 4 hours.  She states she cannot walk out the door due to her migraine. She does not have any migraine medication and wants Imitrex called to her pharmacy  and another bladder antibiotic.  Advised she needs to be seen and evaluated but refuses. SHE DOES NOT WANT TO COME TO OFFICE.   Pharmacy- [facility] [phone]. They will deliver her medication to her. Advised I would forward message and concerns to the office.

## 2011-12-21 NOTE — Telephone Encounter (Signed)
Best recommendation is ov with U/A. If she cannot get in we can try to treat with macrobid twice a day for seven  Days.   OK to call in imitrex 100 mg take as needed for migraine. #6

## 2011-12-22 ENCOUNTER — Telehealth: Payer: Self-pay | Admitting: Internal Medicine

## 2011-12-22 MED ORDER — SUMATRIPTAN SUCCINATE 100 MG PO TABS: 100.0000 mg | ORAL_TABLET | ORAL | Status: DC | PRN

## 2011-12-22 MED ORDER — ZOLMITRIPTAN 5 MG PO TABS: 5.0000 mg | ORAL_TABLET | ORAL | Status: DC | PRN

## 2011-12-22 MED ORDER — NITROFURANTOIN MONOHYD MACRO 100 MG PO CAPS: 100.0000 mg | ORAL_CAPSULE | Freq: Two times a day (BID) | ORAL | Status: AC

## 2011-12-22 MED ORDER — PROMETHAZINE HCL 12.5 MG PO TABS
12.5000 mg | ORAL_TABLET | Freq: Four times a day (QID) | ORAL | Status: DC | PRN
Start: 2011-12-22 — End: 2012-04-05

## 2011-12-22 NOTE — Telephone Encounter (Signed)
Patient informed. 

## 2011-12-22 NOTE — Telephone Encounter (Signed)
Please see note from 12/21/2011

## 2011-12-22 NOTE — Telephone Encounter (Signed)
Left message on machine for pt to return my call  

## 2011-12-22 NOTE — Telephone Encounter (Signed)
Caller: Ruthie/Patient; Patient Name: [name]; PCP: Illene Regulus (Adults only); Best Callback Phone Number: [phone] Calling to find out if she can take Marcobid with an allergy to Sulfa Medications. Advised that Macrobid is not a Sulfa derivative but that Immitrex is listed as med to possibly avoid (per [employer]). Margart states that she has used it before and was okay but not as effective as Zomig. Cherylann Ratel Requests that Zomig 5 mgs 1 PO prn be called into [facility] in case  Immitrex not effective. That way she can pick it up if she needs to. Medication Question Protocol.

## 2011-12-22 NOTE — Telephone Encounter (Signed)
Caller: Janice/Patient; Patient Name: [name]; PCP: Illene Regulus (Adults only); Best Callback Phone Number: [phone]; Reason for call: migraine with nausea/vomiting & bladder infection (diagnosed). Onset migraine 12/18/11 and bladder infection 12/01/11 (has finished antibiotic already). Afebrile. Patient feels dizzy and weak right now. Still able to stand up and walk to the bathroom. Has vomited x 2 this morning 12/22/11. Reports tenderness over her temples at this time. Emergent symptom of "New tenderness over temporal area" positive per Headache guideline. Disposition: See provider within 4 hours. No appointments available for the time frame identified. Patient is asking for generic of Imitrex for the migraine and Cipro for her bladder infection symptoms. Also asking for Phergan refill for nausea. PLEASE CALL PATIENT BACK AND ADVISE APPOINTMENT TIME OR URGENT CARE. Thank you.

## 2011-12-22 NOTE — Telephone Encounter (Signed)
Rx's sent to pharmacy, pt informed.  

## 2011-12-22 NOTE — Telephone Encounter (Signed)
zomig done per erx

## 2011-12-26 ENCOUNTER — Telehealth: Payer: Self-pay | Admitting: Internal Medicine

## 2011-12-26 NOTE — Telephone Encounter (Signed)
Patient notified of approval for U/A to be done at lab. Patient given name of podiatrist and will make her appt.

## 2011-12-26 NOTE — Telephone Encounter (Signed)
Caller: Kalene/Patient; Phone: [phone]; Reason for Call: Please call pt to advise she has finished 2nd round of antibiotics for UTI but still has symptoms; please call her to advise if she can drop specimen off tomorrow am.  She also needs name of Podiatrist Dr Debby Bud was to refer her to, can office call to make appt?  Please call to advise.

## 2011-12-26 NOTE — Telephone Encounter (Signed)
Ok for u/a - trest for cure. Hold specimen to culture if positive.  May see Dr. Harriet Pho - she can call for her own appointment

## 2011-12-28 ENCOUNTER — Telehealth: Payer: Self-pay | Admitting: *Deleted

## 2011-12-28 ENCOUNTER — Other Ambulatory Visit (INDEPENDENT_AMBULATORY_CARE_PROVIDER_SITE_OTHER): Payer: Self-pay

## 2011-12-28 DIAGNOSIS — N390 Urinary tract infection, site not specified: Secondary | ICD-10-CM

## 2011-12-28 LAB — URINALYSIS, ROUTINE W REFLEX MICROSCOPIC
Bilirubin Urine: NEGATIVE
Ketones, ur: NEGATIVE
Nitrite: NEGATIVE
Urobilinogen, UA: 0.2 (ref 0.0–1.0)

## 2011-12-28 NOTE — Telephone Encounter (Signed)
error 

## 2011-12-29 ENCOUNTER — Other Ambulatory Visit: Payer: Self-pay | Admitting: Internal Medicine

## 2012-01-01 ENCOUNTER — Telehealth: Payer: Self-pay | Admitting: Internal Medicine

## 2012-01-01 NOTE — Telephone Encounter (Signed)
Patient calling, she completed the Macrodatin given and took a specimen to the office on 9/5.  Report is back.  She is still c/o lower abdominal pain, frequency (got up 5 times last night). Wants to know what she should do? She drinking at least 8 glasses of water daily.

## 2012-01-02 NOTE — Telephone Encounter (Signed)
U/a is negative.for frequency may try vesicare samples

## 2012-01-02 NOTE — Telephone Encounter (Signed)
vesicare 5 mg once a day

## 2012-01-02 NOTE — Telephone Encounter (Signed)
PATIENT NOTIFIED OF U/A NEGATIVE. PATIENT STATES SHE CAN NOT DRIVE AT THIS TIME TO COME GET SAMPLES OF VESICARE. COULD WE CALL THEM TO [facility] SO IT WOULD BE DELIVERED TO HER.. ALSO WANTED TO LET YOU KNOW SHE WENT TO  PODIATRIST YOU RECCOMENDED FOR HER NUMB FEET AND WAS TOLD SHE HAD NERVE DAMAGE IN HER FEET.Marland Kitchen PLEASE ADVISE ON DOSE OF VESICARE. DID NOT FIND ANY SAMPLES.   .... [name]

## 2012-01-03 MED ORDER — SOLIFENACIN SUCCINATE 5 MG PO TABS: 5.0000 mg | ORAL_TABLET | Freq: Every day | ORAL | Status: DC

## 2012-01-03 NOTE — Telephone Encounter (Signed)
Patient notified medication called to [facility] and to be delivered to her home.

## 2012-01-04 ENCOUNTER — Ambulatory Visit: Payer: Self-pay | Admitting: Internal Medicine

## 2012-01-16 ENCOUNTER — Telehealth: Payer: Self-pay | Admitting: Internal Medicine

## 2012-01-16 NOTE — Telephone Encounter (Signed)
Last u/a was negative. Patient offered vesicare samples - don't know if she picked them up. Will need u/a 788.1 before antibiotics will be prescribed.

## 2012-01-16 NOTE — Telephone Encounter (Signed)
Caller: [name]/Patient; Patient Name: [name]; PCP: Illene Regulus (Adults only); Best Callback Phone Number: [phone]. Caller reports she was seen in the recent past and diagnosised with UTI. Caller reports she still has symptoms and is feeling bad due to symptoms. She would like MD to write an RX for Cipro. Reports her mom takes that and it's the only drug that clears her symptoms. Pharmacy is [facility]. Caller can be reached at above #.

## 2012-01-17 ENCOUNTER — Telehealth: Payer: Self-pay | Admitting: Internal Medicine

## 2012-01-17 NOTE — Telephone Encounter (Signed)
Left message on patient phone # of need to be seen if having symptoms of UTI. Can not call in antibiotic. Left message of samples of vesicare can be picked up here at office to try.

## 2012-01-17 NOTE — Telephone Encounter (Signed)
Left message on machine for pt to return call to office  

## 2012-01-17 NOTE — Telephone Encounter (Signed)
Caller: Hilaria/Patient; Patient Name: [name]; PCP: Illene Regulus (Adults only); Best Callback Phone Number: [phone]; Call regarding: Urinary Symptoms; onset 5 wks ago; pelvic pain; frequency; burning; low back pain; has used Vesicare that she had delivered from the pharmacy for a week with no relief of sxs; All emergent sxs of Urinary Symptoms - Female protocol r/o except "urinary tract sxs and any flank or low back pain"; disposition see within 4 hrs; says she has been calling and trying to get a rx for Cipro; checked epic and told her that Dr.Norins said her last UA was negative and that if she is still having UTI sxs, she will need another UA; instructed to use ED if she was having unbearable pain; Daryana declined

## 2012-01-17 NOTE — Telephone Encounter (Signed)
Caller: Kelleen/Patient; Phone: [phone]; Reason for Call: Patient returning a call, not sure who had tried to call her.  Please call her back.  Thanks

## 2012-02-27 ENCOUNTER — Telehealth: Payer: Self-pay | Admitting: *Deleted

## 2012-02-27 NOTE — Telephone Encounter (Signed)
Patient called states has office appt. On Thursday here. And is there any lab work  She will need before the visit? Please advise

## 2012-02-28 NOTE — Telephone Encounter (Signed)
Will determine needs for lab at her visit.

## 2012-02-28 NOTE — Telephone Encounter (Signed)
patient notified of lab test will be done after visit with Dr. Debby Bud.

## 2012-02-29 ENCOUNTER — Encounter: Payer: Self-pay | Admitting: Internal Medicine

## 2012-02-29 ENCOUNTER — Ambulatory Visit (INDEPENDENT_AMBULATORY_CARE_PROVIDER_SITE_OTHER): Payer: Self-pay | Admitting: Internal Medicine

## 2012-02-29 VITALS — BP 170/100 | HR 86 | Temp 97.9°F | Resp 16 | Wt 152.0 lb

## 2012-02-29 DIAGNOSIS — K589 Irritable bowel syndrome without diarrhea: Secondary | ICD-10-CM

## 2012-02-29 DIAGNOSIS — R61 Generalized hyperhidrosis: Secondary | ICD-10-CM

## 2012-02-29 DIAGNOSIS — IMO0001 Reserved for inherently not codable concepts without codable children: Secondary | ICD-10-CM

## 2012-02-29 DIAGNOSIS — E039 Hypothyroidism, unspecified: Secondary | ICD-10-CM

## 2012-02-29 MED ORDER — METHADONE HCL 10 MG PO TABS: ORAL_TABLET | ORAL | Status: DC

## 2012-02-29 NOTE — Assessment & Plan Note (Signed)
Predominantly with constipation  Plan - trial of Linzess 290 mg once a day (#8) provided

## 2012-02-29 NOTE — Patient Instructions (Addendum)
IBS with predominantly constipation - plan trial of Linzess 290 mg once a day.  For excessive sweating - referral to endocrinologist for "hormonal" evaluation.  bb

## 2012-02-29 NOTE — Assessment & Plan Note (Signed)
Renewed methadone.
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
## 2012-02-29 NOTE — Assessment & Plan Note (Signed)
Lab Results  Component Value Date   TSH 2.01 10/05/2011   All TSH values since 2010 are normal.   Plan Continue present dose of medication

## 2012-03-12 ENCOUNTER — Encounter: Payer: Self-pay | Admitting: Endocrinology

## 2012-03-12 ENCOUNTER — Ambulatory Visit (INDEPENDENT_AMBULATORY_CARE_PROVIDER_SITE_OTHER): Payer: Self-pay | Admitting: Endocrinology

## 2012-03-12 VITALS — BP 126/80 | HR 90 | Temp 98.4°F | Wt 148.0 lb

## 2012-03-12 DIAGNOSIS — R61 Generalized hyperhidrosis: Secondary | ICD-10-CM

## 2012-03-12 DIAGNOSIS — L680 Hirsutism: Secondary | ICD-10-CM

## 2012-03-12 MED ORDER — DICYCLOMINE HCL 10 MG PO CAPS: 10.0000 mg | ORAL_CAPSULE | Freq: Three times a day (TID) | ORAL | Status: DC | PRN
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
## 2012-03-12 NOTE — Patient Instructions (Addendum)
Blood and urine tests are being requested for you today.  We'll contact you with results.  i have sent a prescription to your pharmacy, to help your symptoms, on a trial basis.

## 2012-03-14 ENCOUNTER — Telehealth: Payer: Self-pay | Admitting: Endocrinology

## 2012-03-14 NOTE — Telephone Encounter (Signed)
Pt would like to know if her fibromyalgia symptoms could be caused pompeii disease? Please advise.

## 2012-03-14 NOTE — Telephone Encounter (Signed)
please call patient: This is out of the scope of my practice.  Please ask dr Debby Bud.

## 2012-03-15 NOTE — Telephone Encounter (Signed)
Patient returned call to office. Instructed as Dr. Everardo All stated to ask Dr. Debby Bud of her concerns for the symptoms she is asking about.

## 2012-03-19 LAB — CATECHOLAMINES, FRACTIONATED, URINE, 24 HOUR
Dopamine, 24 hr Urine: 272 mcg/24 h (ref 52–480)
Epinephrine, 24 hr Urine: 5 mcg/24 h (ref 2–24)
Total Volume - CF 24Hr U: 1800 mL

## 2012-03-19 LAB — METANEPHRINES, URINE, 24 HOUR: Metanephrines, Ur: 93 mcg/24 h (ref 90–315)

## 2012-03-20 ENCOUNTER — Telehealth: Payer: Self-pay | Admitting: Internal Medicine

## 2012-03-20 LAB — ESTRADIOL, FREE

## 2012-03-20 NOTE — Telephone Encounter (Signed)
Pt has called back & needs a letter from [facility] to help her pay for her insurance.  [name]

## 2012-03-20 NOTE — Telephone Encounter (Signed)
Spoke with patient-can not come in on Friday and next open slot is Thursday 03/28/12. Pt took this appt and will discuss needs for letter with [facility] then as well.

## 2012-03-28 ENCOUNTER — Ambulatory Visit: Payer: Self-pay | Admitting: Internal Medicine

## 2012-04-03 ENCOUNTER — Ambulatory Visit (INDEPENDENT_AMBULATORY_CARE_PROVIDER_SITE_OTHER): Payer: Self-pay | Admitting: Internal Medicine

## 2012-04-03 ENCOUNTER — Encounter: Payer: Self-pay | Admitting: Internal Medicine

## 2012-04-03 VITALS — BP 160/98 | HR 79 | Ht 66.0 in | Wt 142.4 lb

## 2012-04-03 DIAGNOSIS — R05 Cough: Secondary | ICD-10-CM

## 2012-04-03 MED ORDER — BENZONATATE 200 MG PO CAPS: 200.0000 mg | ORAL_CAPSULE | Freq: Three times a day (TID) | ORAL | Status: DC | PRN

## 2012-04-03 MED ORDER — FLUTICASONE FUROATE-VILANTEROL 100-25 MCG/INH IN AEPB: 1.0000 | INHALATION_SPRAY | Freq: Every day | RESPIRATORY_TRACT | Status: DC

## 2012-04-03 NOTE — Patient Instructions (Addendum)
Sample Breo ellipta    1 puff then rinse mouth, once daily  Script refill tesalon perles/ benzonatate    For cough if needed
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
## 2012-04-05 ENCOUNTER — Observation Stay (HOSPITAL_COMMUNITY)
Admission: EM | Admit: 2012-04-05 | Discharge: 2012-04-07 | Disposition: A | Discharge status: Home / Self Care | Payer: 59 | Attending: Internal Medicine | Admitting: Internal Medicine

## 2012-04-05 ENCOUNTER — Emergency Department (HOSPITAL_COMMUNITY): Payer: Self-pay

## 2012-04-05 ENCOUNTER — Encounter (HOSPITAL_COMMUNITY): Payer: Self-pay | Admitting: *Deleted

## 2012-04-05 DIAGNOSIS — R030 Elevated blood-pressure reading, without diagnosis of hypertension: Secondary | ICD-10-CM

## 2012-04-05 DIAGNOSIS — M797 Fibromyalgia: Secondary | ICD-10-CM | POA: Diagnosis present

## 2012-04-05 DIAGNOSIS — G9332 Myalgic encephalomyelitis/chronic fatigue syndrome: Secondary | ICD-10-CM | POA: Diagnosis present

## 2012-04-05 DIAGNOSIS — E781 Pure hyperglyceridemia: Secondary | ICD-10-CM

## 2012-04-05 DIAGNOSIS — R61 Generalized hyperhidrosis: Secondary | ICD-10-CM | POA: Insufficient documentation

## 2012-04-05 DIAGNOSIS — F329 Major depressive disorder, single episode, unspecified: Secondary | ICD-10-CM | POA: Insufficient documentation

## 2012-04-05 DIAGNOSIS — I10 Essential (primary) hypertension: Secondary | ICD-10-CM

## 2012-04-05 DIAGNOSIS — G8929 Other chronic pain: Secondary | ICD-10-CM | POA: Insufficient documentation

## 2012-04-05 DIAGNOSIS — R110 Nausea: Secondary | ICD-10-CM | POA: Insufficient documentation

## 2012-04-05 DIAGNOSIS — K589 Irritable bowel syndrome without diarrhea: Secondary | ICD-10-CM | POA: Insufficient documentation

## 2012-04-05 DIAGNOSIS — R0609 Other forms of dyspnea: Secondary | ICD-10-CM | POA: Insufficient documentation

## 2012-04-05 DIAGNOSIS — E039 Hypothyroidism, unspecified: Secondary | ICD-10-CM

## 2012-04-05 DIAGNOSIS — R002 Palpitations: Secondary | ICD-10-CM | POA: Insufficient documentation

## 2012-04-05 DIAGNOSIS — M5137 Other intervertebral disc degeneration, lumbosacral region: Secondary | ICD-10-CM | POA: Diagnosis present

## 2012-04-05 DIAGNOSIS — K219 Gastro-esophageal reflux disease without esophagitis: Secondary | ICD-10-CM

## 2012-04-05 DIAGNOSIS — M545 Low back pain, unspecified: Secondary | ICD-10-CM | POA: Insufficient documentation

## 2012-04-05 DIAGNOSIS — R079 Chest pain, unspecified: Principal | ICD-10-CM | POA: Diagnosis present

## 2012-04-05 DIAGNOSIS — IMO0001 Reserved for inherently not codable concepts without codable children: Secondary | ICD-10-CM | POA: Insufficient documentation

## 2012-04-05 DIAGNOSIS — E785 Hyperlipidemia, unspecified: Secondary | ICD-10-CM | POA: Insufficient documentation

## 2012-04-05 DIAGNOSIS — M25519 Pain in unspecified shoulder: Secondary | ICD-10-CM | POA: Insufficient documentation

## 2012-04-05 DIAGNOSIS — R0989 Other specified symptoms and signs involving the circulatory and respiratory systems: Secondary | ICD-10-CM | POA: Insufficient documentation

## 2012-04-05 DIAGNOSIS — Z79899 Other long term (current) drug therapy: Secondary | ICD-10-CM | POA: Insufficient documentation

## 2012-04-05 DIAGNOSIS — F3289 Other specified depressive episodes: Secondary | ICD-10-CM | POA: Insufficient documentation

## 2012-04-05 HISTORY — DX: Guillain-Barre syndrome: G61.0

## 2012-04-05 HISTORY — DX: Respiratory conditions due to smoke inhalation: J70.5

## 2012-04-05 LAB — TROPONIN I: Troponin I: <0.3 ng/mL (ref <0.30)

## 2012-04-05 LAB — PROTIME-INR
INR: 0.93 (ref 0.00–1.49)
Prothrombin Time: 12.4 seconds (ref 11.6–15.2)

## 2012-04-05 LAB — CBC WITH DIFFERENTIAL/PLATELET
Basophils Absolute: 0 10*3/uL (ref 0.0–0.1)
Basophils Relative: 1 % (ref 0–1)
Eosinophils Absolute: 0.1 10*3/uL (ref 0.0–0.7)
Eosinophils Relative: 1 % (ref 0–5)
HCT: 40.1 % (ref 36.0–46.0)
Hemoglobin: 13.2 g/dL (ref 12.0–15.0)
Lymphocytes Relative: 26 % (ref 12–46)
Lymphs Abs: 1.4 10*3/uL (ref 0.7–4.0)
MCH: 28.2 pg (ref 26.0–34.0)
MCHC: 32.9 g/dL (ref 30.0–36.0)
MCV: 85.7 fL (ref 78.0–100.0)
Monocytes Absolute: 0.5 10*3/uL (ref 0.1–1.0)
Monocytes Relative: 9 % (ref 3–12)
Neutro Abs: 3.4 10*3/uL (ref 1.7–7.7)
Neutrophils Relative %: 64 % (ref 43–77)
Platelets: 267 10*3/uL (ref 150–400)
RBC: 4.68 MIL/uL (ref 3.87–5.11)
RDW: 13.3 % (ref 11.5–15.5)
WBC: 5.3 10*3/uL (ref 4.0–10.5)

## 2012-04-05 LAB — COMPREHENSIVE METABOLIC PANEL
ALT: 20 U/L (ref 0–35)
AST: 29 U/L (ref 0–37)
Albumin: 4.2 g/dL (ref 3.5–5.2)
Alkaline Phosphatase: 122 U/L — ABNORMAL HIGH (ref 39–117)
BUN: 15 mg/dL (ref 6–23)
CO2: 28 mEq/L (ref 19–32)
Calcium: 9.9 mg/dL (ref 8.4–10.5)
Chloride: 100 mEq/L (ref 96–112)
Creatinine, Ser: 0.91 mg/dL (ref 0.50–1.10)
GFR calc Af Amer: 78 mL/min — ABNORMAL LOW (ref >90)
GFR calc non Af Amer: 67 mL/min — ABNORMAL LOW (ref >90)
Glucose, Bld: 111 mg/dL — ABNORMAL HIGH (ref 70–99)
Potassium: 3.9 mEq/L (ref 3.5–5.1)
Sodium: 140 mEq/L (ref 135–145)
Total Bilirubin: 0.3 mg/dL (ref 0.3–1.2)
Total Protein: 7.9 g/dL (ref 6.0–8.3)

## 2012-04-05 LAB — D-DIMER, QUANTITATIVE: D-Dimer, Quant: 0.74 ug/mL-FEU — ABNORMAL HIGH (ref 0.00–0.48)

## 2012-04-05 LAB — POCT I-STAT TROPONIN I
Troponin i, poc: 0 ng/mL (ref 0.00–0.08)
Troponin i, poc: 0 ng/mL (ref 0.00–0.08)

## 2012-04-05 LAB — APTT: aPTT: 32 seconds (ref 24–37)

## 2012-04-05 LAB — LIPID PANEL
Cholesterol: 163 mg/dL (ref 0–200)
Total CHOL/HDL Ratio: 2.1 RATIO
Triglycerides: 38 mg/dL (ref <150)
VLDL: 8 mg/dL (ref 0–40)

## 2012-04-05 MED ORDER — SODIUM CHLORIDE 0.9 % IV SOLN
INTRAVENOUS | Status: DC
  Administered 2012-04-05: 21:00:00 via INTRAVENOUS
  Administered 2012-04-06: 75 mL via INTRAVENOUS

## 2012-04-05 MED ORDER — LEVOTHYROXINE SODIUM 50 MCG PO TABS
50.0000 ug | ORAL_TABLET | Freq: Every day | ORAL | Status: DC
  Administered 2012-04-06 – 2012-04-07 (×2): 50 ug via ORAL
  Filled 2012-04-05 (×3): qty 1

## 2012-04-05 MED ORDER — ASPIRIN 325 MG PO TABS
325.0000 mg | ORAL_TABLET | Freq: Every day | ORAL | Status: DC
  Administered 2012-04-06 – 2012-04-07 (×2): 325 mg via ORAL
  Filled 2012-04-05 (×2): qty 1

## 2012-04-05 MED ORDER — MAGNESIUM HYDROXIDE 400 MG/5ML PO SUSP: 10.0000 mL | Freq: Every day | ORAL | Status: DC | PRN

## 2012-04-05 MED ORDER — ONDANSETRON HCL 4 MG PO TABS
4.0000 mg | ORAL_TABLET | Freq: Four times a day (QID) | ORAL | Status: DC | PRN
  Administered 2012-04-05 – 2012-04-07 (×2): 4 mg via ORAL
  Filled 2012-04-05 (×2): qty 1

## 2012-04-05 MED ORDER — METOPROLOL TARTRATE 12.5 MG HALF TABLET
12.5000 mg | ORAL_TABLET | Freq: Two times a day (BID) | ORAL | Status: DC
  Administered 2012-04-05: 12.5 mg via ORAL
  Filled 2012-04-05 (×3): qty 1

## 2012-04-05 MED ORDER — MORPHINE SULFATE 4 MG/ML IJ SOLN
4.0000 mg | Freq: Once | INTRAMUSCULAR | Status: AC
  Administered 2012-04-05: 4 mg via INTRAVENOUS
  Filled 2012-04-05: qty 1

## 2012-04-05 MED ORDER — AMITRIPTYLINE HCL 50 MG PO TABS
50.0000 mg | ORAL_TABLET | Freq: Every day | ORAL | Status: DC
  Administered 2012-04-05 – 2012-04-06 (×2): 50 mg via ORAL
  Filled 2012-04-05 (×3): qty 1

## 2012-04-05 MED ORDER — ONDANSETRON HCL 4 MG/2ML IJ SOLN: 4.0000 mg | Freq: Four times a day (QID) | INTRAMUSCULAR | Status: DC | PRN

## 2012-04-05 MED ORDER — ONDANSETRON HCL 4 MG/2ML IJ SOLN
4.0000 mg | Freq: Once | INTRAMUSCULAR | Status: AC
  Administered 2012-04-05: 4 mg via INTRAVENOUS
  Filled 2012-04-05: qty 2

## 2012-04-05 MED ORDER — REGADENOSON 0.4 MG/5ML IV SOLN
0.4000 mg | Freq: Once | INTRAVENOUS | Status: AC
  Administered 2012-04-06: 0.4 mg via INTRAVENOUS
  Filled 2012-04-05: qty 5

## 2012-04-05 MED ORDER — AMITRIPTYLINE HCL 25 MG PO TABS: 50.0000 mg | ORAL_TABLET | Freq: Every day | ORAL | Status: DC

## 2012-04-05 MED ORDER — HYDROMORPHONE HCL PF 1 MG/ML IJ SOLN: 1.0000 mg | Freq: Once | INTRAMUSCULAR | Status: DC

## 2012-04-05 MED ORDER — MORPHINE SULFATE 2 MG/ML IJ SOLN
2.0000 mg | INTRAMUSCULAR | Status: DC | PRN
  Administered 2012-04-05 – 2012-04-07 (×3): 2 mg via INTRAVENOUS
  Filled 2012-04-05 (×3): qty 1

## 2012-04-05 MED ORDER — METHADONE HCL 10 MG PO TABS
30.0000 mg | ORAL_TABLET | Freq: Two times a day (BID) | ORAL | Status: DC
  Administered 2012-04-05 – 2012-04-06 (×2): 30 mg via ORAL
  Filled 2012-04-05 (×2): qty 3

## 2012-04-05 MED ORDER — SODIUM CHLORIDE 0.9 % IJ SOLN
3.0000 mL | Freq: Two times a day (BID) | INTRAMUSCULAR | Status: DC
  Administered 2012-04-05 – 2012-04-06 (×2): 3 mL via INTRAVENOUS

## 2012-04-05 MED ORDER — SUMATRIPTAN SUCCINATE 100 MG PO TABS
100.0000 mg | ORAL_TABLET | ORAL | Status: DC | PRN
  Administered 2012-04-05 – 2012-04-07 (×3): 100 mg via ORAL
  Filled 2012-04-05 (×4): qty 1

## 2012-04-05 MED ORDER — NITROGLYCERIN IN D5W 200-5 MCG/ML-% IV SOLN
2.0000 ug/min | INTRAVENOUS | Status: DC
  Administered 2012-04-05: 5 ug/min via INTRAVENOUS
  Filled 2012-04-05: qty 250

## 2012-04-05 MED ORDER — OXYCODONE HCL 5 MG PO TABS
5.0000 mg | ORAL_TABLET | ORAL | Status: DC | PRN
  Administered 2012-04-06: 5 mg via ORAL
  Filled 2012-04-05: qty 1

## 2012-04-05 MED ORDER — HYDROMORPHONE HCL PF 1 MG/ML IJ SOLN
1.0000 mg | Freq: Once | INTRAMUSCULAR | Status: AC
  Administered 2012-04-05: 1 mg via INTRAVENOUS
  Filled 2012-04-05: qty 1

## 2012-04-05 MED ORDER — GEMFIBROZIL 600 MG PO TABS
600.0000 mg | ORAL_TABLET | Freq: Two times a day (BID) | ORAL | Status: DC
  Administered 2012-04-06 – 2012-04-07 (×3): 600 mg via ORAL
  Filled 2012-04-05 (×5): qty 1

## 2012-04-05 MED ORDER — FLUTICASONE PROPIONATE HFA 44 MCG/ACT IN AERO
1.0000 | INHALATION_SPRAY | Freq: Two times a day (BID) | RESPIRATORY_TRACT | Status: DC
  Administered 2012-04-06 – 2012-04-07 (×4): 1 via RESPIRATORY_TRACT
  Filled 2012-04-05: qty 10.6

## 2012-04-05 MED ORDER — MAGNESIUM HYDROXIDE 800 MG/5ML PO SUSP: 5.0000 mL | Freq: Every day | ORAL | Status: DC | PRN

## 2012-04-05 NOTE — ED Notes (Signed)
Admitting MD at bedside.
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
## 2012-04-05 NOTE — ED Notes (Signed)
Pt in radiology 

## 2012-04-05 NOTE — ED Provider Notes (Addendum)
This was a shared encounter.  Notably, the patient presented with chest pain, and given her family history of cardiac disease there suspicion for ACS.  Over, the patient was largely unremarkable throughout her ED stay, with reassuring vital signs, initial labs.  With her history, the persistency of her pain we advocated for admission for further evaluation and management.    Gerhard Munch, MD 04/05/12 1724  Gerhard Munch, MD 04/05/12 [phone]
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
## 2012-04-05 NOTE — ED Notes (Signed)
Per EMS- pt began having left chest pain at 7am while at rest. Unrelieved by 1 nitro. Pt received 324mg  of asprin prior to arrival. Pt denies radiation. Pt was seen recently by PCP for cough. 150/100. 98% on room air
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
## 2012-04-06 ENCOUNTER — Observation Stay (HOSPITAL_COMMUNITY): Payer: Self-pay

## 2012-04-06 DIAGNOSIS — R030 Elevated blood-pressure reading, without diagnosis of hypertension: Secondary | ICD-10-CM

## 2012-04-06 LAB — BASIC METABOLIC PANEL
Chloride: 105 mEq/L (ref 96–112)
GFR calc non Af Amer: 65 mL/min — ABNORMAL LOW (ref >90)
Glucose, Bld: 76 mg/dL (ref 70–99)
Potassium: 3.3 mEq/L — ABNORMAL LOW (ref 3.5–5.1)
Sodium: 140 mEq/L (ref 135–145)

## 2012-04-06 LAB — CBC
HCT: 38.3 % (ref 36.0–46.0)
Hemoglobin: 12.5 g/dL (ref 12.0–15.0)
MCH: 28 pg (ref 26.0–34.0)
RBC: 4.46 MIL/uL (ref 3.87–5.11)

## 2012-04-06 LAB — DRUGS OF ABUSE SCREEN W/O ALC, ROUTINE URINE
Benzodiazepines.: POSITIVE — AB
Marijuana Metabolite: NEGATIVE
Opiate Screen, Urine: POSITIVE — AB
Phencyclidine (PCP): NEGATIVE
Propoxyphene: NEGATIVE

## 2012-04-06 MED ORDER — PANTOPRAZOLE SODIUM 40 MG PO TBEC
40.0000 mg | DELAYED_RELEASE_TABLET | Freq: Two times a day (BID) | ORAL | Status: DC
  Administered 2012-04-06 – 2012-04-07 (×2): 40 mg via ORAL
  Filled 2012-04-06 (×2): qty 1

## 2012-04-06 MED ORDER — TECHNETIUM TC 99M SESTAMIBI GENERIC - CARDIOLITE
30.0000 | Freq: Once | INTRAVENOUS | Status: AC | PRN
  Administered 2012-04-06: 30 via INTRAVENOUS

## 2012-04-06 MED ORDER — METHADONE HCL 10 MG/ML PO CONC: 30.0000 mg | Freq: Four times a day (QID) | ORAL | Status: DC

## 2012-04-06 MED ORDER — POTASSIUM CHLORIDE CRYS ER 20 MEQ PO TBCR
20.0000 meq | EXTENDED_RELEASE_TABLET | Freq: Three times a day (TID) | ORAL | Status: DC
  Administered 2012-04-06 – 2012-04-07 (×4): 20 meq via ORAL
  Filled 2012-04-06 (×6): qty 1

## 2012-04-06 MED ORDER — METOPROLOL TARTRATE 25 MG PO TABS
25.0000 mg | ORAL_TABLET | Freq: Two times a day (BID) | ORAL | Status: DC
  Administered 2012-04-07: 25 mg via ORAL
  Filled 2012-04-06 (×2): qty 1

## 2012-04-06 MED ORDER — REGADENOSON 0.4 MG/5ML IV SOLN
INTRAVENOUS | Status: AC
  Filled 2012-04-06: qty 5

## 2012-04-06 MED ORDER — TECHNETIUM TC 99M SESTAMIBI GENERIC - CARDIOLITE
10.0000 | Freq: Once | INTRAVENOUS | Status: AC | PRN
  Administered 2012-04-06: 10 via INTRAVENOUS

## 2012-04-06 MED ORDER — MORPHINE SULFATE 4 MG/ML IJ SOLN
4.0000 mg | Freq: Once | INTRAMUSCULAR | Status: AC
  Administered 2012-04-06: 4 mg via INTRAVENOUS
  Filled 2012-04-06: qty 1

## 2012-04-06 MED ORDER — FUROSEMIDE 20 MG PO TABS
20.0000 mg | ORAL_TABLET | Freq: Every day | ORAL | Status: DC
  Administered 2012-04-06 – 2012-04-07 (×2): 20 mg via ORAL
  Filled 2012-04-06 (×2): qty 1

## 2012-04-06 MED ORDER — METHADONE HCL 10 MG PO TABS
30.0000 mg | ORAL_TABLET | Freq: Four times a day (QID) | ORAL | Status: DC
  Administered 2012-04-06 – 2012-04-07 (×4): 30 mg via ORAL
  Filled 2012-04-06 (×4): qty 3

## 2012-04-06 MED ORDER — POTASSIUM CHLORIDE CRYS ER 20 MEQ PO TBCR: 20.0000 meq | EXTENDED_RELEASE_TABLET | Freq: Once | ORAL | Status: DC

## 2012-04-06 NOTE — Progress Notes (Signed)
Subjective: [name] has been admitted for persistent substernal chest pressure unrelieved by Nitro to r/o MI and obstructive coronary disease.  She is seen in Nuclear Medicine - getting myoview study - where she reports continued chest pressure but no pain, no radiation of pain and no SOB.  Objective: Lab: Lab Results  Component Value Date   WBC 5.8 04/06/2012   HGB 12.5 04/06/2012   HCT 38.3 04/06/2012   MCV 85.9 04/06/2012   PLT 257 04/06/2012   BMET    Component Value Date/Time   NA 140 04/06/2012 0554   K 3.3* 04/06/2012 0554   CL 105 04/06/2012 0554   CO2 25 04/06/2012 0554   GLUCOSE 76 04/06/2012 0554   BUN 15 04/06/2012 0554   CREATININE 0.94 04/06/2012 0554   CALCIUM 9.5 04/06/2012 0554   GFRNONAA 65* 04/06/2012 0554   GFRAA 75* 04/06/2012 0554   Cardiac Panel (last 3 results)  Basename 04/06/12 0700 04/06/12 0028 04/05/12 2004  CKTOTAL -- -- --  CKMB -- -- --  TROPONINI <0.30 <0.30 <0.30  RELINDX -- -- --  12 lead EKG with NSR - 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  Imaging: CXR - no active disease.  Scheduled Meds:   . amitriptyline  50 mg Oral QHS  . aspirin  325 mg Oral Daily  . fluticasone  1 puff Inhalation BID  . gemfibrozil  600 mg Oral BID WC  . levothyroxine  50 mcg Oral QAC breakfast  . methadone  30 mg Oral Q12H  . metoprolol tartrate  12.5 mg Oral BID  . regadenoson      . sodium chloride  3 mL Intravenous Q12H   Continuous Infusions:   . sodium chloride 75 mL/hr at 04/05/12 2105  . nitroGLYCERIN 5 mcg/min (04/05/12 2104)   PRN Meds:.magnesium hydroxide, morphine injection, ondansetron (ZOFRAN) IV, ondansetron, oxyCODONE, SUMAtriptan   Physical Exam: Filed Vitals:   04/06/12 0959  BP: 185/89  Pulse:   Temp:   Resp:    Gen'l- WNWD white woman sitting in w/c in no distress HEENT- C&S clear Cor- 2+ radial, RRR Pulm - normal respirations     Assessment/Plan: 1. Cardiac - for r/o MI and obstructive disease. 2 D echo in June  '12 normal without wall motion abnormality. Cardiac cath '04 w/o obstructive disease. Troponins negative x 3.  Plan -  Complete current evaluation. If myoview is normal or low risk, with negative enyzmes will plan on d/c home with further outpatient evaluation for other sources of her discomfort.   Start PPI therapy at bid dosing for the initial period.   2. Hypertension - out of control.  Plan  Increase lopressor to 25 mg bid  Add furosemide 20 mg daily.  Other chronic problems are stable.  dispo - home in AM if work-up negative    Illene Regulus [facility] (o) [phone]; (c) [phone] Call-grp - Patsi Sears IM  Tele: [phone]  04/06/2012, 10:44 AM

## 2012-04-06 NOTE — Progress Notes (Signed)
Nitro turned off for [employer] stress test.

## 2012-04-06 NOTE — Progress Notes (Signed)
Subjective:  Still mild chest discomfort but not as severe. Just returned from [facility]. Results pending. BP improving.  Objective:  Vital Signs in the last 24 hours: Temp:  [98 F (36.7 C)-98.9 F (37.2 C)] 98.4 F (36.9 C) (12/14 0745) Pulse Rate:  [57-76] 76  (12/14 0745) Resp:  [11-25] 17  (12/14 0745) BP: (115-195)/(61-131) 185/89 mmHg (12/14 0959) SpO2:  [95 %-100 %] 97 % (12/14 0745) Weight:  [137 lb 5.6 oz (62.3 kg)] 137 lb 5.6 oz (62.3 kg) (12/13 2115)  Intake/Output from previous day: 12/13 0701 - 12/14 0700 In: 758.7 [I.V.:758.7] Out: 700 [Urine:700] Intake/Output from this shift: Total I/O In: 76.5 [I.V.:76.5] Out: -      . amitriptyline  50 mg Oral QHS  . aspirin  325 mg Oral Daily  . fluticasone  1 puff Inhalation BID  . furosemide  20 mg Oral Daily  . gemfibrozil  600 mg Oral BID WC  . levothyroxine  50 mcg Oral QAC breakfast  . methadone  30 mg Oral Q12H  . metoprolol tartrate  25 mg Oral BID  . pantoprazole  40 mg Oral BID AC  . regadenoson      . sodium chloride  3 mL Intravenous Q12H      . sodium chloride 75 mL (04/06/12 1109)    Physical Exam: The patient appears to be in no distress.  Head and neck exam reveals that the pupils are equal and reactive.  The extraocular movements are full.  There is no scleral icterus.  Mouth and pharynx are benign.  No lymphadenopathy.  No carotid bruits.  The jugular venous pressure is normal.  Thyroid is not enlarged or tender.  Chest is clear to percussion and auscultation.  No rales or rhonchi.  Expansion of the chest is symmetrical.  Heart reveals no abnormal lift or heave.  First and second heart sounds are normal.  There is no murmur gallop rub or click.  The abdomen is soft and nontender.  Bowel sounds are normoactive.  There is no hepatosplenomegaly or mass.  There are no abdominal bruits.  Extremities reveal no phlebitis or edema.  Pedal pulses are good.  There is no cyanosis or  clubbing.  Neurologic exam is normal strength and no lateralizing weakness.  No sensory deficits.  Integument reveals no rash  Lab Results:  Basename 04/06/12 0554 04/05/12 1140  WBC 5.8 5.3  HGB 12.5 13.2  PLT 257 267    Basename 04/06/12 0554 04/05/12 1140  NA 140 140  K 3.3* 3.9  CL 105 100  CO2 25 28  GLUCOSE 76 111*  BUN 15 15  CREATININE 0.94 0.91    Basename 04/06/12 0700 04/06/12 0028  TROPONINI <0.30 <0.30   Hepatic Function Panel  Basename 04/05/12 1140  PROT 7.9  ALBUMIN 4.2  AST 29  ALT 20  ALKPHOS 122*  BILITOT 0.3  BILIDIR --  IBILI --    Basename 04/05/12 2004  CHOL 163   No results found for this basename: PROTIME in the last 72 hours  Imaging: Dg Chest 2 View  04/05/2012  *RADIOLOGY REPORT*  Clinical Data: Chest pain  CHEST - 2 VIEW  Comparison: 07/27/2011  Findings: Chronic interstitial markings/emphysematous changes.  No focal consolidation.  Biapical pleural parenchymal scarring.  No pleural effusion or pneumothorax.  Cardiomediastinal silhouette is within normal limits.  Mild degenerative changes of the visualized thoracolumbar spine.  IMPRESSION: No evidence of acute cardiopulmonary disease.   Original Report Authenticated By: Lurlean Horns  Rito Ehrlich, M.D.     Cardiac Studies:  Assessment/Plan:   Chest pain (04/05/2012)   Assessment: EKG shows no ischemic changes. Cardiac enzymes negative. Myoview completed, results pending.   Plan: Home in am if myoview negative. BP control per Dr. Debby Bud.    LOS: 1 day    Cassell Clement 04/06/2012, 11:27 AM

## 2012-04-07 DIAGNOSIS — R079 Chest pain, unspecified: Principal | ICD-10-CM

## 2012-04-07 DIAGNOSIS — K589 Irritable bowel syndrome without diarrhea: Secondary | ICD-10-CM

## 2012-04-07 DIAGNOSIS — G8929 Other chronic pain: Secondary | ICD-10-CM

## 2012-04-07 DIAGNOSIS — I10 Essential (primary) hypertension: Secondary | ICD-10-CM

## 2012-04-07 MED ORDER — PANTOPRAZOLE SODIUM 40 MG PO TBEC: 40.0000 mg | DELAYED_RELEASE_TABLET | Freq: Two times a day (BID) | ORAL | Status: DC

## 2012-04-07 MED ORDER — FUROSEMIDE 20 MG PO TABS: 20.0000 mg | ORAL_TABLET | Freq: Every day | ORAL | Status: DC

## 2012-04-07 MED ORDER — METOPROLOL TARTRATE 25 MG PO TABS: 25.0000 mg | ORAL_TABLET | Freq: Two times a day (BID) | ORAL | Status: DC

## 2012-04-07 NOTE — Progress Notes (Signed)
Pt dc home via wheelchair with belongings. Understands dc instructions.

## 2012-04-07 NOTE — Progress Notes (Signed)
Pt awakened from sleep complaining of 8/10 chest pain radiating down left arm and jaw.  EKG performed with no changes from previous EKG on 12/13.  HR in 60's.  BP 163/75. MD notified.  2mg  morphine given.  Will continue to monitor.  Dara Hoyer.

## 2012-04-07 NOTE — Progress Notes (Signed)
Subjective: Feels better. No chest pain  Objective: Lab: Lab Results  Component Value Date   WBC 5.8 04/06/2012   HGB 12.5 04/06/2012   HCT 38.3 04/06/2012   MCV 85.9 04/06/2012   PLT 257 04/06/2012   BMET    Component Value Date/Time   NA 140 04/06/2012 0554   K 3.3* 04/06/2012 0554   CL 105 04/06/2012 0554   CO2 25 04/06/2012 0554   GLUCOSE 76 04/06/2012 0554   BUN 15 04/06/2012 0554   CREATININE 0.94 04/06/2012 0554   CALCIUM 9.5 04/06/2012 0554   GFRNONAA 65* 04/06/2012 0554   GFRAA 75* 04/06/2012 0554     Imaging:  Scheduled Meds:   . amitriptyline  50 mg Oral QHS  . aspirin  325 mg Oral Daily  . fluticasone  1 puff Inhalation BID  . furosemide  20 mg Oral Daily  . gemfibrozil  600 mg Oral BID WC  . levothyroxine  50 mcg Oral QAC breakfast  . methadone  30 mg Oral QID  . metoprolol tartrate  25 mg Oral BID  . pantoprazole  40 mg Oral BID AC  . potassium chloride  20 mEq Oral TID  . sodium chloride  3 mL Intravenous Q12H   Continuous Infusions:  PRN Meds:.magnesium hydroxide, morphine injection, ondansetron (ZOFRAN) IV, ondansetron, oxyCODONE, SUMAtriptan   Physical Exam: Filed Vitals:   04/07/12 0841  BP: 153/73  Pulse:   Temp: 97.8 F (36.6 C)  Resp:     See d/c summary      Assessment/Plan: For d/c home.  Dictated #[id number]   Illene Regulus [facility] (o) [phone]; (c) [phone] Call-grp - Patsi Sears IM  Tele: [phone]  04/07/2012, 10:03 AM

## 2012-04-08 NOTE — Discharge Summary (Signed)
NAMEJOPLIN, CANTY                 ACCOUNT NO.:  [id number]  MEDICAL RECORD NO.:  [id number]  LOCATION:  2920                         FACILITY:  [facility]  PHYSICIAN:  Rosalyn Gess. Faigy Stretch, MD  DATE OF BIRTH:  01-Sep-1950  DATE OF ADMISSION:  04/05/2012 DATE OF DISCHARGE:  04/07/2012                              DISCHARGE SUMMARY   NOTE:  The patient was admitted to the hospital on observation April 05, 2012.  Discharge in the a.m. on April 07, 2012.  ADMITTING DIAGNOSIS:  Rule out myocardial infarction.  DISCHARGE DIAGNOSES: 1. Myocardial infarction ruled out. 2. Possible GI source of pain and discomfort.  CONSULTANTS:  Luis Abed, MD, [facility] and Cassell Clement, MD, for cardiology.  PROCEDURES:  Chest x-ray at admission which showed no evidence of acute cardiopulmonary disease.  A 12-lead EKG on admission which showed no acute abnormality.  The question of old septal infarct of undetermined age.  A followup EKG on April 07, 2012 read out as sinus bradycardia with questionable septal infarct.  Stress Myoview nuclear scan which was read as a normal study with no evidence of ischemia with normal ejection fraction.  HISTORY OF THE PRESENT ILLNESS:  This patient is a 61 year old woman with multiple medical problems, who on the day of admission, reported that she came back from walking her dog and had the onset of sharp stabbing chest pain which was persistent.  She reports there was some pressure radiated to her left arm and shoulder.  She complained of nausea and palpitation.  After 3 hours at home, she called 911, was brought to the hospital for evaluation.  Her blood pressure was significantly elevated with a systolic of 190.  Nitroglycerin was administered en route which did help with her chest pain.  She was still having chest pain.  At time of admission.  The patient was brought in for rule out MI.  Please see the H and P as well as past epic records for medical  history and social history, physical exam, and current medications.  HOSPITAL COURSE: 1. Cardiology.  The patient is admitted to the CCU.  Cardiac enzymes     were cycled with troponin negative x3.  She had a stress Myoview     which was negative for any signs of ischemic heart disease.  The     patient was started on PPI therapy and her symptoms     markedly improved.  With the patient having ruled out for MI, at     this point she is ready for discharge to home without further     evaluation.  Of note, she did have a negative cardiac cath in 2004     and in June, 2012 had a negative 2D echo with no wall motion     abnormalities. 2. Hypertension.  The patient was hypertensive.  She was started on a     regimen of furosemide 20 mg daily and Lopressor 25 mg b.i.d.  She     tolerated this well, her blood pressure was markedly improved.  She     will continue these medications at home.  The patient's other medical problems including chronic  pain and fibromyalgia have been stable.  DISCHARGE PHYSICAL EXAMINATION:  VITAL SIGNS:  Temperature was 97.8 blood pressure 153/73, heart rate was 76, respirations were 18.  Oxygen saturation was 98%. GENERAL APPEARANCE:  A well-nourished, well-developed woman, in no acute distress. HEENT:  Exam was unremarkable.  Pupils equal, round, and reactive. NECK:  Supple. CHEST:  The patient is moving air well with no rales wheezes or rhonchi. CARDIOVASCULAR:  Radial pulse +2.  Her precordium is quiet.  She had a regular rate and rhythm. NEUROLOGIC:  The patient is awake, alert.  She is oriented to person, place, time, and context.  FINAL LABORATORIES:  From April 06, 2012; sodium 140, potassium 3.3, chloride 105, CO2 of 25, BUN 15, creatinine 0.94, glucose 76. Hemoglobin 12.5 g white count was 5800, platelet count 257,000.  The patient did have an A1c that was normal at 5.6%.  TSH was normal at 1.34.  Troponin were negative x3.  Cholesterol at 163.   Triglyceride 38. HDL was 77, LDL was 78.  DISPOSITION:  The patient was discharged to home.  She will continue on proton pump inhibitor using Pantoprazole 40 mg b.i.d. and liquid antacid of choice.  She will continue on furosemide and Lopressor for blood pressure control.  FOLLOWUP:  The patient will be seen in 7-10 days for a followup, sooner if needed.  The patient's condition at time of discharge dictation is stable.     Rosalyn Gess Jayd Forrey, MD     MEN/MEDQ  D:  04/07/2012  T:  04/08/2012  Job:  [id number]

## 2012-04-09 ENCOUNTER — Telehealth: Payer: Self-pay | Admitting: Endocrinology

## 2012-04-09 LAB — BENZODIAZEPINE, QUANTITATIVE, URINE
Alprazolam (GC/LC/MS), ur confirm: NEGATIVE ng/mL
Alprazolam metabolite (GC/LC/MS), ur confirm: NEGATIVE ng/mL
Clonazepam metabolite (GC/LC/MS), ur confirm: NEGATIVE ng/mL
Estazolam (GC/LC/MS), ur confirm: NEGATIVE ng/mL
Flunitrazepam metabolite (GC/LC/MS), ur confirm: NEGATIVE ng/mL
Flurazepam GC/MS Conf: NEGATIVE ng/mL
Midazolam (GC/LC/MS), ur confirm: NEGATIVE ng/mL
Temazepam GC/MS Conf: 497 ng/mL

## 2012-04-09 LAB — OPIATE, QUANTITATIVE, URINE
Codeine Urine: NEGATIVE ng/mL
Hydromorphone GC/MS Conf: NEGATIVE ng/mL
Morphine, Confirm: 1514 ng/mL
Oxymorphone: NEGATIVE ng/mL

## 2012-04-09 NOTE — Telephone Encounter (Signed)
Patient is requesting last lab results 

## 2012-04-09 NOTE — Telephone Encounter (Signed)
Normal--good 

## 2012-04-09 NOTE — Telephone Encounter (Signed)
Pt would like to speak with you regarding labs, pt states she was told she would have her "testosterone level checked, she wants to know why it was not checked?? Pt states she just paid 600 for labs, please advise.

## 2012-04-09 NOTE — Telephone Encounter (Signed)
Pt advised and states an understanding 

## 2012-04-09 NOTE — Telephone Encounter (Signed)
Testosterone was 19--normal--good
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
## 2012-04-15 ENCOUNTER — Telehealth: Payer: Self-pay | Admitting: *Deleted

## 2012-04-15 ENCOUNTER — Telehealth: Payer: Self-pay | Admitting: Internal Medicine

## 2012-04-15 NOTE — Telephone Encounter (Signed)
Pt called stating that she believes she has UTI/bladder infection. She is not able to come in. Will Dr. Debby Bud please call her something in. Please advise in Dr. Debby Bud absence.

## 2012-04-15 NOTE — Telephone Encounter (Signed)
Pt called again. Wants something called in for a uti.  Does not want to pay $125. copay for an appt.

## 2012-04-15 NOTE — Telephone Encounter (Signed)
Called pt and told her that Dr Debby Bud was not in today. Pt to see if he has any appts for tomorrow. Routed phone call to scheduling.

## 2012-04-15 NOTE — Telephone Encounter (Signed)
[name], Because she is Dr. Debby Bud patient, I would like her to come in for an OV. He is here tomorrow if she would like to call back then to see if he would call her something in. Thx! Rene Kocher

## 2012-04-16 ENCOUNTER — Other Ambulatory Visit: Payer: Self-pay | Admitting: Internal Medicine

## 2012-04-16 ENCOUNTER — Other Ambulatory Visit (INDEPENDENT_AMBULATORY_CARE_PROVIDER_SITE_OTHER): Payer: Self-pay

## 2012-04-16 DIAGNOSIS — R300 Dysuria: Secondary | ICD-10-CM

## 2012-04-16 LAB — URINALYSIS, ROUTINE W REFLEX MICROSCOPIC
Nitrite: NEGATIVE
Specific Gravity, Urine: 1.015 (ref 1.000–1.030)
pH: 6.5 (ref 5.0–8.0)

## 2012-04-16 NOTE — Telephone Encounter (Signed)
Sent to lab for u/a

## 2012-04-18 ENCOUNTER — Ambulatory Visit: Payer: Self-pay | Admitting: Internal Medicine

## 2012-04-22 ENCOUNTER — Ambulatory Visit (INDEPENDENT_AMBULATORY_CARE_PROVIDER_SITE_OTHER): Payer: Self-pay | Admitting: Internal Medicine

## 2012-04-22 ENCOUNTER — Encounter: Payer: Self-pay | Admitting: Internal Medicine

## 2012-04-22 VITALS — BP 128/74 | HR 57 | Temp 98.4°F | Resp 10 | Wt 141.0 lb

## 2012-04-22 DIAGNOSIS — N951 Menopausal and female climacteric states: Secondary | ICD-10-CM

## 2012-04-22 DIAGNOSIS — K219 Gastro-esophageal reflux disease without esophagitis: Secondary | ICD-10-CM

## 2012-04-22 DIAGNOSIS — R079 Chest pain, unspecified: Secondary | ICD-10-CM

## 2012-04-22 DIAGNOSIS — R030 Elevated blood-pressure reading, without diagnosis of hypertension: Secondary | ICD-10-CM

## 2012-04-22 DIAGNOSIS — IMO0001 Reserved for inherently not codable concepts without codable children: Secondary | ICD-10-CM

## 2012-04-22 MED ORDER — FLUCONAZOLE 150 MG PO TABS: 150.0000 mg | ORAL_TABLET | Freq: Once | ORAL | Status: DC

## 2012-04-22 MED ORDER — CIPROFLOXACIN HCL 250 MG PO TABS: 250.0000 mg | ORAL_TABLET | Freq: Two times a day (BID) | ORAL | Status: DC

## 2012-04-22 MED ORDER — ESTRADIOL 2 MG PO TABS: 1.0000 mg | ORAL_TABLET | Freq: Every day | ORAL | Status: DC

## 2012-04-22 NOTE — Patient Instructions (Addendum)
UTI - will retreat with cipro 250 mg twice a day for 7 days.  Hot flashes - normal endocrine work up.  Plan - estrogen replacement with estradiol 1 mg (1/2 tablet) once a day. The increased risk of breast cancer is low, especially during the first 5 years of treatment.   GI - continue the Protonix  Blood pressure - good control on the lopressor twice a day. Continue this dosing.   Have a Happy New Year and thank you for the cookies.
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
## 2012-04-24 NOTE — Assessment & Plan Note (Signed)
Renewed methadone Rx. She is doing well: no mental status changes, no constipation.

## 2012-04-24 NOTE — Assessment & Plan Note (Signed)
During recent hospitalization and eval for CAD she was started on lopressor 25 mg bid and furosemide. She was intolerant of the latter but has continued the lopressor. BP Readings from Last 3 Encounters:  04/22/12 128/74  04/07/12 130/76  04/03/12 160/98   Plan - continue lopressor bid.

## 2012-04-24 NOTE — Assessment & Plan Note (Signed)
Recent hospitalization for pain: cardiac enzymes negative, had previous cath in '06 w/o obstructive disease, negative nuc med stress test. She was started on PPI and has done well.  Plan - continue PPI therapy

## 2012-04-24 NOTE — Assessment & Plan Note (Signed)
Ruled out for MI. No evidence of obstructive disease/ischemia. Patient is symptom free at this time.

## 2012-04-24 NOTE — Assessment & Plan Note (Signed)
Patient still bothered by sweats and hot flashes. Discussed risks of HRT: established increased risk of breast cancer at 5 yrs + of treatment, other risks minimal (CVA, CAD).  Plan - estradiol 1 mg po daily

## 2012-06-11 ENCOUNTER — Telehealth: Payer: Self-pay

## 2012-06-11 DIAGNOSIS — R61 Generalized hyperhidrosis: Secondary | ICD-10-CM

## 2012-06-11 MED ORDER — METHADONE HCL 10 MG PO TABS: ORAL_TABLET | ORAL | Status: DC

## 2012-06-11 NOTE — Telephone Encounter (Signed)
Patient called [facility] requesting refill for Methadone. Thanks

## 2012-06-11 NOTE — Telephone Encounter (Signed)
Ok for methadone refills x 3 scripts

## 2012-06-11 NOTE — Telephone Encounter (Signed)
Please read phone note below and advise. 

## 2012-06-26 ENCOUNTER — Other Ambulatory Visit: Payer: Self-pay | Admitting: *Deleted

## 2012-06-26 MED ORDER — METHADONE HCL 10 MG PO TABS: ORAL_TABLET | ORAL | Status: DC

## 2012-06-26 NOTE — Telephone Encounter (Signed)
Patient called to request Rx for pain medication be written correctly to take to pharmacy at [facility]. Patient states they were written for the wrong amount on 06/11/2012. Patient will pick up tomorrow. Rx for Methadone printed and given to Dr. Debby Bud nurse. For signature.

## 2012-07-19 ENCOUNTER — Telehealth: Payer: Self-pay | Admitting: Internal Medicine

## 2012-07-19 DIAGNOSIS — R61 Generalized hyperhidrosis: Secondary | ICD-10-CM

## 2012-07-19 NOTE — Telephone Encounter (Signed)
Patient needs refill on methadone sent to [facility], the order they have says it can not be filled until the 5th and she will be out this sunday

## 2012-07-19 NOTE — Telephone Encounter (Signed)
Pt was given three RX's for Methadone.  The 1st two were written for a quanity of 360 tablets.  The one filled on March 6 was written for 240 tablets, so she will run out this week or this weekend. Please call [facility].

## 2012-07-19 NOTE — Telephone Encounter (Signed)
No. She knows the rules - no early refills, especially at the last minute. Sorry

## 2012-07-22 MED ORDER — METHADONE HCL 10 MG PO TABS: ORAL_TABLET | ORAL | Status: DC

## 2012-07-22 NOTE — Telephone Encounter (Signed)
Rx was written to be filled 03/05 quantity of #240 - a 20 day supply by our office. Per Pharmacist at [facility], pt filled this Rx on 03/05. Rx printed for 10 day supply to fill today to bridge pt until next fill date of 04/05.   Rx printed and placed on side A counter for signature, please contact pt for Rx pick up once signed

## 2012-07-22 NOTE — Telephone Encounter (Signed)
See previous phone note: I asked that pharmacy be called as to why Rx was not for 360 and that she could have the balance.

## 2012-07-22 NOTE — Telephone Encounter (Signed)
Pt called again stating that Rx written to be filled 06/26/2012 was for # 240 which is only a 20 day. Pt ran out of medication 03/25 and is requesting Rx for 5 day supply (#60) until she is able to fill Rx dated for 04/05, please advise.

## 2012-07-22 NOTE — Telephone Encounter (Signed)
I don't know why she would have been shorted 120 tabs. Please call pharmacy - may have additional 120 if not already filled.

## 2012-07-22 NOTE — Telephone Encounter (Signed)
Patient Information:  Caller Name: Tiani  Phone: [phone]  Patient: [name]  Gender: Female  DOB: Dec 24, 1950  Age: 62 Years  PCP: Illene Regulus (Adults only)  Office Follow Up:  Does the office need to follow up with this patient?: Yes  Instructions For The Office: Patient declines to be seen in the office or in the ED due to insurance reasons. RN strongly advised for patient to be seen. Possible risk factors explained to patient. Patient verbalizes understanding.  RN spoke with Shanda Bumps RN in office regarding Methadone Rx and above sx. Shanda Bumps states she will get a message to Dr. Debby Bud. Patient uses [facility] at [phone]. Patient can be reached at [phone].  RN Note:  Patient states she recieved a Rx for Methadone 10mg .; 3 tablets QID, # 240 tablets on 06/26/12. Patient ran out of Methadone on 07/19/12 and states next Rx cannot be filled until 07/27/12. RN reviewed Counselling psychologist Health Record. RN noted in [employer] [facility] Record that Rx written on 06/26/12 was for a quantity of 240 and the other two Rx's are written for quantity of 360. Patient states she is in withdrawal. Patient states she feels shakey, unable to sleep, increased agitation, nausea, and generalized pain. Onset of sx 07/20/12. States blood pressure elevated to 179/93. Triage per Withdrawal symptoms Protocol. Disposition of " See in ED Immediately" obtained related to positive triage assessment for " Elevated blood pressure of systolic above 160." Patient also states she developed shingles on chest 07/19/12. Describes as red, raised blisters " in a group.". Patient states she developed red,raised area on her cheek 07/22/12. Describes as big, red raised bump on her cheek 07/22/12. Patient declines to be seen in the office or in the ED due to insurance reasons. RN strongly advised for patient to be seen. Possible risk factors explained to patient. Patient verbalizes understanding.  RN spoke with  Shanda Bumps RN in office regarding Methadone Rx and above sx. Shanda Bumps states she will get a message to Dr. Debby Bud. Patient uses [employer] at [phone]. Patient can be reached at [phone].  Symptoms  Reason For Call & Symptoms: Patient is out of Methadone and is in withdrawal  Reviewed Health History In EMR: Yes  Reviewed Medications In EMR: Yes  Reviewed Allergies In EMR: Yes  Reviewed Surgeries / Procedures: Yes  Date of Onset of Symptoms: 07/19/2012  Guideline(s) Used:  Rash or Redness - Localized  Disposition Per Guideline:   See Today in Office  Reason For Disposition Reached:   Painful rash and has multiple small blisters grouped together in one area of body (i.e., dermatomal distribution or "band" or "stripe")  Advice Given:  Avoid Soap:  Wash the area once thoroughly with soap to remove any remaining irritants. Thereafter avoid soaps to this area. Cleanse the area when needed with warm water.  Apply Cold to the Area:  Apply or soak in cold water for 20 minutes every 3 to 4 hours to reduce itching or pain.  Avoid Scratching:  Try not to scratch. Cut your fingernails short.  Call Back If:  Rash spreads or becomes worse  You become worse.  Call Back If:  Redness occurs  Fever occurs  More pimples occur  You become worse.  Patient Refused Recommendation:  Patient Requests Prescription  Patient declines to be seen in the office or in the ED due to insurance reasons. RN strongly advised for patient to be seen. Possible risk factors explained to patient. Patient verbalizes understanding.  RN spoke with Shanda Bumps RN in office regarding Methadone Rx and above sx. Shanda Bumps states she will get a message to Dr. Debby Bud. Patient uses [facility] at [phone]. Patient can be reached at [phone].

## 2012-07-24 NOTE — Telephone Encounter (Signed)
Pt informed per Pennie Banter, CMA, Rx in cabinet for pt pick up

## 2012-08-08 ENCOUNTER — Telehealth: Payer: Self-pay | Admitting: Internal Medicine

## 2012-08-08 MED ORDER — DIAZEPAM 5 MG PO TABS: 5.0000 mg | ORAL_TABLET | Freq: Two times a day (BID) | ORAL | Status: DC | PRN

## 2012-08-08 NOTE — Telephone Encounter (Signed)
Patient calls with Migraine she has had since 08/05/12.  She is not able to sleep, has an outbreak of shingles and has had a tooth to break which can't be treated until Migraine resolved.  Attempted to have Diazepam refilled but was told that it has expired.  It is the only thing that really helps with the headaches.  Last prescription written on 11/2011.  She still has three refills but the pharmacist says they have expired.  She is requesting the script be renewed and written as Dr. Debby Bud does which is Diazepam 2 mg,  1 tablet TID with 2 at HS dispense 150.RN verified prescription information within Epic.   She states she does not take them unless she has too due to the way it makes her feel.  She is unable to drive due to the Migraine.  She is asking for it to be called in as soon as possible because her pharmacy-[facility] a 14:00 cut off time for home deliveries.  She needs to have them bring it to her.  Please follow up with patient as needed.

## 2012-08-08 NOTE — Telephone Encounter (Signed)
Patient informed of refill and MD instructions.

## 2012-08-08 NOTE — Telephone Encounter (Signed)
i am not comfortable with the previous high dose prescription   Best I can do is valium 5 mg bid #60, no refill - Done hardcopy to robin

## 2012-08-08 NOTE — Telephone Encounter (Signed)
Called to request refill of Diazepam (Valium) 2 mg, Dispense #150;  Reports it is ordered as 1 po TID and 2 tabs hs.  Uses it only prn. Last filled 06/05/12.  Ran out 08/06/12. Unable to find Valium on medication history.  Last office visit 04/22/12.  Requesting Valium refill be sent now since she uses it for management of migraine headache.  Is stressed from taxes and broken tooth.  BP is 177/78.  Declined triage for headache/BP.  Requesting Rx be called to [facility] [phone] now so RX can be delivered today; last delivery goes out at 1400.   Please order refill to be delivered today and call Breiona back ASAP.

## 2012-08-22 ENCOUNTER — Ambulatory Visit (INDEPENDENT_AMBULATORY_CARE_PROVIDER_SITE_OTHER): Payer: Self-pay | Admitting: Internal Medicine

## 2012-08-22 ENCOUNTER — Other Ambulatory Visit (INDEPENDENT_AMBULATORY_CARE_PROVIDER_SITE_OTHER): Payer: Self-pay

## 2012-08-22 ENCOUNTER — Encounter: Payer: Self-pay | Admitting: Internal Medicine

## 2012-08-22 VITALS — BP 128/86 | HR 68 | Temp 97.8°F | Resp 16 | Wt 144.5 lb

## 2012-08-22 DIAGNOSIS — IMO0001 Reserved for inherently not codable concepts without codable children: Secondary | ICD-10-CM

## 2012-08-22 DIAGNOSIS — E876 Hypokalemia: Secondary | ICD-10-CM

## 2012-08-22 DIAGNOSIS — E781 Pure hyperglyceridemia: Secondary | ICD-10-CM

## 2012-08-22 DIAGNOSIS — E039 Hypothyroidism, unspecified: Secondary | ICD-10-CM

## 2012-08-22 DIAGNOSIS — R61 Generalized hyperhidrosis: Secondary | ICD-10-CM

## 2012-08-22 LAB — CBC WITH DIFFERENTIAL/PLATELET
Basophils Relative: 0.6 % (ref 0.0–3.0)
Eosinophils Relative: 1.9 % (ref 0.0–5.0)
HCT: 40.6 % (ref 36.0–46.0)
Hemoglobin: 13.2 g/dL (ref 12.0–15.0)
Lymphs Abs: 2.6 10*3/uL (ref 0.7–4.0)
MCV: 85.8 fl (ref 78.0–100.0)
Monocytes Relative: 8.5 % (ref 3.0–12.0)
Neutro Abs: 4 10*3/uL (ref 1.4–7.7)
Platelets: 264 10*3/uL (ref 150.0–400.0)
RBC: 4.73 Mil/uL (ref 3.87–5.11)
WBC: 7.3 10*3/uL (ref 4.5–10.5)

## 2012-08-22 LAB — COMPREHENSIVE METABOLIC PANEL
ALT: 12 U/L (ref 0–35)
AST: 23 U/L (ref 0–37)
Alkaline Phosphatase: 108 U/L (ref 39–117)
BUN: 18 mg/dL (ref 6–23)
Creatinine, Ser: 1 mg/dL (ref 0.4–1.2)
Total Bilirubin: 0.5 mg/dL (ref 0.3–1.2)

## 2012-08-22 LAB — CK: Total CK: 73 U/L (ref 7–177)

## 2012-08-22 LAB — MAGNESIUM: Magnesium: 2.1 mg/dL (ref 1.5–2.5)

## 2012-08-22 MED ORDER — METHADONE HCL 10 MG PO TABS: ORAL_TABLET | ORAL | Status: DC

## 2012-08-22 NOTE — Progress Notes (Signed)
Subjective:    Patient ID: [name], female    DOB: 06-24-1950, 62 y.o.   MRN: [id number]  Thyroid Problem Presents for follow-up visit. Symptoms include anxiety, fatigue and hair loss. Patient reports no cold intolerance, constipation, depressed mood, diaphoresis, diarrhea, dry skin, heat intolerance, hoarse voice, leg swelling, menstrual problem, nail problem, palpitations, tremors, visual change, weight gain or weight loss. The symptoms have been stable.      Review of Systems  Constitutional: Positive for fatigue. Negative for fever, chills, weight loss, weight gain, diaphoresis, activity change, appetite change and unexpected weight change.  HENT: Negative.  Negative for hoarse voice.   Eyes: Negative.   Respiratory: Negative.  Negative for cough, chest tightness, shortness of breath, wheezing and stridor.   Cardiovascular: Negative.  Negative for chest pain, palpitations and leg swelling.  Gastrointestinal: Negative.  Negative for nausea, vomiting, abdominal pain, diarrhea, constipation, blood in stool and anal bleeding.  Endocrine: Negative.  Negative for cold intolerance and heat intolerance.  Genitourinary: Negative.  Negative for menstrual problem.  Musculoskeletal: Positive for myalgias, back pain (chronic,unchanged) and arthralgias. Negative for joint swelling and gait problem.  Skin: Negative.  Negative for color change, pallor, rash and wound.  Allergic/Immunologic: Negative.   Neurological: Negative for dizziness, tremors, seizures, syncope, facial asymmetry, speech difficulty, weakness, light-headedness, numbness and headaches.  Hematological: Negative.  Negative for adenopathy. Does not bruise/bleed easily.  Psychiatric/Behavioral: Negative for suicidal ideas, hallucinations, behavioral problems, confusion, sleep disturbance, self-injury, dysphoric mood, decreased concentration and agitation. The patient is nervous/anxious. The patient is not hyperactive.         Objective:   Physical Exam  Vitals reviewed. Constitutional: She is oriented to person, place, and time. She appears well-developed and well-nourished. No distress.  HENT:  Head: Normocephalic and atraumatic.  Mouth/Throat: Oropharynx is clear and moist. No oropharyngeal exudate.  Eyes: Conjunctivae are normal. Right eye exhibits no discharge. Left eye exhibits no discharge. No scleral icterus.  Neck: Normal range of motion. Neck supple. No JVD present. No tracheal deviation present. No thyromegaly present.  Cardiovascular: Normal rate, regular rhythm, normal heart sounds and intact distal pulses.  Exam reveals no gallop and no friction rub.   No murmur heard. Pulmonary/Chest: Effort normal and breath sounds normal. No stridor. No respiratory distress. She has no wheezes. She has no rales. She exhibits no tenderness.  Abdominal: Soft. Bowel sounds are normal. She exhibits no distension and no mass. There is no tenderness. There is no rebound and no guarding.  Musculoskeletal: Normal range of motion. She exhibits no edema and no tenderness.  Lymphadenopathy:    She has no cervical adenopathy.  Neurological: She is oriented to person, place, and time.  Skin: Skin is warm and dry. No rash noted. She is not diaphoretic. No erythema. No pallor.  Psychiatric: She has a normal mood and affect. Her behavior is normal. Judgment and thought content normal.     Lab Results  Component Value Date   WBC 5.8 04/06/2012   HGB 12.5 04/06/2012   HCT 38.3 04/06/2012   PLT 257 04/06/2012   GLUCOSE 76 04/06/2012   CHOL 163 04/05/2012   TRIG 38 04/05/2012   HDL 77 04/05/2012   LDLDIRECT 150.8 10/27/2011   LDLCALC 78 04/05/2012   ALT 20 04/05/2012   AST 29 04/05/2012   NA 140 04/06/2012   K 3.3* 04/06/2012   CL 105 04/06/2012   CREATININE 0.94 04/06/2012   BUN 15 04/06/2012   CO2 25 04/06/2012  TSH 1.338 04/05/2012   INR 0.93 04/05/2012   HGBA1C 5.6 04/05/2012       Assessment & Plan:

## 2012-08-22 NOTE — Patient Instructions (Signed)

## 2012-08-23 ENCOUNTER — Encounter: Payer: Self-pay | Admitting: Internal Medicine

## 2012-08-23 MED ORDER — LEVOTHYROXINE SODIUM 50 MCG PO TABS: 50.0000 ug | ORAL_TABLET | Freq: Every day | ORAL | Status: DC

## 2012-08-23 NOTE — Assessment & Plan Note (Signed)
Her TSH is in the normal so I will continue her current dose

## 2012-08-23 NOTE — Assessment & Plan Note (Signed)
I will recheck her K+ level today 

## 2012-08-23 NOTE — Assessment & Plan Note (Signed)
I will check her CK and CMP today

## 2012-08-23 NOTE — Assessment & Plan Note (Signed)
Will continue current meds at her request

## 2012-09-24 ENCOUNTER — Telehealth: Payer: Self-pay | Admitting: *Deleted

## 2012-09-24 DIAGNOSIS — IMO0001 Reserved for inherently not codable concepts without codable children: Secondary | ICD-10-CM

## 2012-09-24 MED ORDER — METHADONE HCL 10 MG PO TABS: ORAL_TABLET | ORAL | Status: DC

## 2012-09-24 NOTE — Telephone Encounter (Signed)
Left message for patient to bring in the script and her 3 month supply will be ready.

## 2012-09-24 NOTE — Telephone Encounter (Signed)
1. Needs to return RX 2. OK for methadone, 1 month supply with 3 scripts

## 2012-09-24 NOTE — Telephone Encounter (Signed)
Pt is requesting rx for Methadone prescription for previous amount of 360 tablets for 1 month supply. Pt states that Dr. Debby Bud usually gives her 3 rx's for a 3 months' supply. Pt saw Dr. Yetta Barre and was given an rx for 1 week supply on 08/22/2012. Pt wants to bring in previous rx given by Dr. Yetta Barre and get rx's for 3 month's supply by Dr. Debby Bud.

## 2012-09-30 ENCOUNTER — Other Ambulatory Visit: Payer: Self-pay | Admitting: Internal Medicine

## 2012-10-02 NOTE — Telephone Encounter (Signed)
Ok to refill 

## 2012-10-02 NOTE — Telephone Encounter (Signed)
Please advise if okay to refill. Thanks.  

## 2012-11-12 ENCOUNTER — Other Ambulatory Visit: Payer: Self-pay | Admitting: Internal Medicine

## 2012-12-24 ENCOUNTER — Other Ambulatory Visit: Payer: Self-pay | Admitting: Internal Medicine

## 2012-12-31 ENCOUNTER — Ambulatory Visit (INDEPENDENT_AMBULATORY_CARE_PROVIDER_SITE_OTHER): Payer: Self-pay | Admitting: Internal Medicine

## 2012-12-31 ENCOUNTER — Encounter: Payer: Self-pay | Admitting: Internal Medicine

## 2012-12-31 VITALS — BP 158/90 | HR 60 | Temp 98.6°F | Wt 142.0 lb

## 2012-12-31 DIAGNOSIS — K589 Irritable bowel syndrome without diarrhea: Secondary | ICD-10-CM

## 2012-12-31 DIAGNOSIS — N951 Menopausal and female climacteric states: Secondary | ICD-10-CM

## 2012-12-31 DIAGNOSIS — F329 Major depressive disorder, single episode, unspecified: Secondary | ICD-10-CM

## 2012-12-31 DIAGNOSIS — R61 Generalized hyperhidrosis: Secondary | ICD-10-CM

## 2012-12-31 DIAGNOSIS — IMO0001 Reserved for inherently not codable concepts without codable children: Secondary | ICD-10-CM

## 2012-12-31 DIAGNOSIS — E039 Hypothyroidism, unspecified: Secondary | ICD-10-CM

## 2012-12-31 MED ORDER — METHADONE HCL 10 MG PO TABS: ORAL_TABLET | ORAL | Status: DC

## 2012-12-31 MED ORDER — ESTRADIOL 2 MG PO TABS: 2.0000 mg | ORAL_TABLET | Freq: Every day | ORAL | Status: DC

## 2012-12-31 NOTE — Patient Instructions (Addendum)
1. Shingles - not sure of the diagnosis - very, very unusual to have so many outbreaks; very unusual to have lesions that cross the midline of the body. Plan If you have another outbreak you need to be seen while the lesions are new - can try for a microbiologic diagnosis.  2. Diaphoresis -(sweats) - no adrenal gland abnormalities based on 24 hr urine studies, normal thyroid function. Enrolling in a woman's hormonal study is a good idea Plan Increase Estrace to 2 mg daily  3. Weight management -  GOOD JOB.  4. Chronic pain management - Rx refills done.

## 2012-12-31 NOTE — Progress Notes (Signed)
Subjective:    Patient ID: [name], female    DOB: April 16, 1951, 62 y.o.   MRN: [id number]  HPI [name] presents for follow and refill of her chronic medications. She had lab work in May that was normal.   She reports she has had up to 7 outbreaks of shingles at different locations - with blisters that would occur bilaterally under the breasts and on her back, right side of face, right leg.   She reports that she has been told she needs $30,000 dental work - supposedly caused from cough syrups and inhalers.  She continues to have flushing and heat with sweats. She is on estrace 0.5 mg daily. Reviewed Evaluation by Dr. Everardo All in Nov '13- normal 24 hr urines for catecholamine and metanephrines, normal thyroid functions.  PMH, FamHx and SocHx reviewed for any changes and relevance.  Current Outpatient Prescriptions on File Prior to Visit  Medication Sig Dispense Refill  . amitriptyline (ELAVIL) 50 MG tablet Take 50 mg by mouth at bedtime.        . beclomethasone (QVAR) 40 MCG/ACT inhaler Inhale 2 puffs into the lungs 2 (two) times daily. Rinse mouth      . benzonatate (TESSALON) 100 MG capsule TAKE 2 CAPSULES EVERY 6 HOURS AS NEEDED FOR COUGH.  50 capsule  0  . Calcium Carbonate-Vitamin D (CALCIUM + D PO) Take 1 tablet by mouth daily.        . diazepam (VALIUM) 5 MG tablet Take 1 tablet (5 mg total) by mouth every 12 (twelve) hours as needed for anxiety.  60 tablet  0  . gemfibrozil (LOPID) 600 MG tablet TAKE 1 TABLET TWICE DAILY.  60 tablet  0  . levothyroxine (SYNTHROID, LEVOTHROID) 50 MCG tablet Take 1 tablet (50 mcg total) by mouth daily.  90 tablet  1  . magnesium hydroxide (MILK OF MAGNESIA) 800 MG/5ML suspension Take 5 mLs by mouth daily as needed.        . metoprolol tartrate (LOPRESSOR) 25 MG tablet Take 1 tablet (25 mg total) by mouth 2 (two) times daily.  60 tablet  11  . Multiple Vitamins-Minerals (CENTRUM PO) Take 1 tablet by mouth daily.        . pantoprazole (PROTONIX)  40 MG tablet Take 1 tablet (40 mg total) by mouth 2 (two) times daily before a meal.  60 tablet  5  . Probiotic Product (ALIGN) 4 MG CAPS Take 1 capsule by mouth daily.        . promethazine (PHENERGAN) 12.5 MG tablet TAKE ONE TABLET EVERY 6 HOURS AS NEEDED.  30 tablet  0  . zolmitriptan (ZOMIG) 5 MG tablet Take 1 tablet (5 mg total) by mouth as needed.  10 tablet  2  . SUMAtriptan (IMITREX) 100 MG tablet Take 1 tablet (100 mg total) by mouth as needed for migraine.  6 tablet  0   No current facility-administered medications on file prior to visit.      Review of Systems System review is negative for any constitutional, cardiac, pulmonary, GI or neuro symptoms or complaints other than as described in the HPI.     Objective:   Physical Exam Filed Vitals:   12/31/12 1306  BP: 158/90  Pulse: 60  Temp: 98.6 F (37 C)   Wt Readings from Last 3 Encounters:  12/31/12 142 lb (64.411 kg)  08/22/12 144 lb 8 oz (65.545 kg)  04/22/12 141 lb (63.957 kg)   Gen'l - WNWD white woman in  no distress HEENT - C&S clear Cor- RRR PUlm - normal respirations Neuro - A&O x 3.      Assessment & Plan:

## 2013-01-02 NOTE — Assessment & Plan Note (Signed)
Stable in her use of methadone. No new GI problems, no somnolence. She has been regular in obtaining refills.   Plan Refill RX for 3 months

## 2013-01-02 NOTE — Assessment & Plan Note (Signed)
Stable w/o complaint of GI distress. Able to take regular diet. She has had a planned weight loss

## 2013-01-02 NOTE — Assessment & Plan Note (Signed)
Continues to c/o being very hot with intermittent hot flashes with flushing. She has seen endocrine - no new therapies. She is planning to try to enroll in a study at [facility] for management of climacteric.

## 2013-01-02 NOTE — Assessment & Plan Note (Signed)
Lab Results  Component Value Date   TSH 0.87 08/22/2012   Stable on present regimen

## 2013-01-02 NOTE — Assessment & Plan Note (Signed)
Doing well at this time  

## 2013-01-20 ENCOUNTER — Other Ambulatory Visit: Payer: Self-pay | Admitting: Internal Medicine

## 2013-02-02 ENCOUNTER — Other Ambulatory Visit: Payer: Self-pay | Admitting: Internal Medicine

## 2013-02-18 ENCOUNTER — Ambulatory Visit (INDEPENDENT_AMBULATORY_CARE_PROVIDER_SITE_OTHER)
Admission: RE | Admit: 2013-02-18 | Discharge: 2013-02-18 | Disposition: A | Discharge status: Home / Self Care | Payer: Self-pay | Source: Ambulatory Visit | Attending: Internal Medicine | Admitting: Internal Medicine

## 2013-02-18 ENCOUNTER — Ambulatory Visit (INDEPENDENT_AMBULATORY_CARE_PROVIDER_SITE_OTHER): Payer: Self-pay | Admitting: Internal Medicine

## 2013-02-18 ENCOUNTER — Encounter: Payer: Self-pay | Admitting: Internal Medicine

## 2013-02-18 VITALS — BP 120/86 | HR 76 | Temp 98.5°F | Wt 139.8 lb

## 2013-02-18 DIAGNOSIS — R110 Nausea: Secondary | ICD-10-CM
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
## 2013-02-18 NOTE — Patient Instructions (Signed)
Chronic nausea - exam worrisome for possible ileus (narcotic induced) w/o signs of acute SBO or inflammation. Other possibility is hyperacidity related symptoms.  Plan 2 view abdomen to r/o ileus  If x-ray normal will treat with PPI therapy, i.e. protonix (generic)

## 2013-02-28 ENCOUNTER — Ambulatory Visit: Payer: Self-pay | Admitting: Obstetrics and Gynecology

## 2013-03-04 ENCOUNTER — Other Ambulatory Visit (INDEPENDENT_AMBULATORY_CARE_PROVIDER_SITE_OTHER): Payer: Self-pay

## 2013-03-04 ENCOUNTER — Ambulatory Visit (INDEPENDENT_AMBULATORY_CARE_PROVIDER_SITE_OTHER): Payer: Self-pay | Admitting: Internal Medicine

## 2013-03-04 ENCOUNTER — Encounter (HOSPITAL_COMMUNITY): Payer: Self-pay | Admitting: Radiology

## 2013-03-04 ENCOUNTER — Other Ambulatory Visit: Payer: Self-pay

## 2013-03-04 ENCOUNTER — Emergency Department (HOSPITAL_COMMUNITY)
Admission: EM | Admit: 2013-03-04 | Discharge: 2013-03-05 | Disposition: A | Discharge status: Home / Self Care | Payer: Self-pay | Attending: Emergency Medicine | Admitting: Emergency Medicine

## 2013-03-04 ENCOUNTER — Telehealth: Payer: Self-pay | Admitting: *Deleted

## 2013-03-04 ENCOUNTER — Encounter: Payer: Self-pay | Admitting: Internal Medicine

## 2013-03-04 ENCOUNTER — Inpatient Hospital Stay: Admission: RE | Admit: 2013-03-04 | Payer: Self-pay | Source: Ambulatory Visit

## 2013-03-04 ENCOUNTER — Emergency Department (HOSPITAL_COMMUNITY): Payer: Self-pay

## 2013-03-04 VITALS — BP 180/96 | HR 75 | Temp 100.1°F | Wt 140.8 lb

## 2013-03-04 DIAGNOSIS — Z888 Allergy status to other drugs, medicaments and biological substances status: Secondary | ICD-10-CM | POA: Insufficient documentation

## 2013-03-04 DIAGNOSIS — E039 Hypothyroidism, unspecified: Secondary | ICD-10-CM | POA: Insufficient documentation

## 2013-03-04 DIAGNOSIS — Z882 Allergy status to sulfonamides status: Secondary | ICD-10-CM | POA: Insufficient documentation

## 2013-03-04 DIAGNOSIS — R42 Dizziness and giddiness: Secondary | ICD-10-CM | POA: Insufficient documentation

## 2013-03-04 DIAGNOSIS — F3289 Other specified depressive episodes: Secondary | ICD-10-CM | POA: Insufficient documentation

## 2013-03-04 DIAGNOSIS — J45909 Unspecified asthma, uncomplicated: Secondary | ICD-10-CM | POA: Insufficient documentation

## 2013-03-04 DIAGNOSIS — R109 Unspecified abdominal pain: Secondary | ICD-10-CM | POA: Insufficient documentation

## 2013-03-04 DIAGNOSIS — G8929 Other chronic pain: Secondary | ICD-10-CM | POA: Insufficient documentation

## 2013-03-04 DIAGNOSIS — K5900 Constipation, unspecified: Secondary | ICD-10-CM | POA: Insufficient documentation

## 2013-03-04 DIAGNOSIS — Z87898 Personal history of other specified conditions: Secondary | ICD-10-CM

## 2013-03-04 DIAGNOSIS — Z79899 Other long term (current) drug therapy: Secondary | ICD-10-CM | POA: Insufficient documentation

## 2013-03-04 DIAGNOSIS — Z8719 Personal history of other diseases of the digestive system: Secondary | ICD-10-CM | POA: Insufficient documentation

## 2013-03-04 DIAGNOSIS — R630 Anorexia: Secondary | ICD-10-CM | POA: Insufficient documentation

## 2013-03-04 DIAGNOSIS — Z85828 Personal history of other malignant neoplasm of skin: Secondary | ICD-10-CM | POA: Insufficient documentation

## 2013-03-04 DIAGNOSIS — M129 Arthropathy, unspecified: Secondary | ICD-10-CM | POA: Insufficient documentation

## 2013-03-04 DIAGNOSIS — IMO0001 Reserved for inherently not codable concepts without codable children: Secondary | ICD-10-CM | POA: Insufficient documentation

## 2013-03-04 DIAGNOSIS — R197 Diarrhea, unspecified: Secondary | ICD-10-CM | POA: Insufficient documentation

## 2013-03-04 DIAGNOSIS — R030 Elevated blood-pressure reading, without diagnosis of hypertension: Secondary | ICD-10-CM

## 2013-03-04 DIAGNOSIS — I10 Essential (primary) hypertension: Secondary | ICD-10-CM | POA: Insufficient documentation

## 2013-03-04 DIAGNOSIS — Z8669 Personal history of other diseases of the nervous system and sense organs: Secondary | ICD-10-CM | POA: Insufficient documentation

## 2013-03-04 DIAGNOSIS — F329 Major depressive disorder, single episode, unspecified: Secondary | ICD-10-CM | POA: Insufficient documentation

## 2013-03-04 DIAGNOSIS — R1013 Epigastric pain: Secondary | ICD-10-CM | POA: Insufficient documentation

## 2013-03-04 DIAGNOSIS — M545 Low back pain, unspecified: Secondary | ICD-10-CM | POA: Insufficient documentation

## 2013-03-04 DIAGNOSIS — R55 Syncope and collapse: Secondary | ICD-10-CM | POA: Insufficient documentation

## 2013-03-04 DIAGNOSIS — R110 Nausea: Secondary | ICD-10-CM | POA: Insufficient documentation

## 2013-03-04 DIAGNOSIS — E785 Hyperlipidemia, unspecified: Secondary | ICD-10-CM | POA: Insufficient documentation

## 2013-03-04 DIAGNOSIS — G610 Guillain-Barre syndrome: Secondary | ICD-10-CM | POA: Insufficient documentation

## 2013-03-04 HISTORY — DX: Essential (primary) hypertension: I10

## 2013-03-04 HISTORY — DX: Unspecified asthma, uncomplicated: J45.909

## 2013-03-04 LAB — AMYLASE: Amylase: 55 U/L (ref 27–131)

## 2013-03-04 LAB — CBC WITH DIFFERENTIAL/PLATELET
Basophils Absolute: 0 10*3/uL (ref 0.0–0.1)
Basophils Absolute: 0 10*3/uL (ref 0.0–0.1)
Basophils Relative: 0.1 % (ref 0.0–3.0)
Eosinophils Absolute: 0.1 10*3/uL (ref 0.0–0.7)
Eosinophils Absolute: 0 10*3/uL (ref 0.0–0.7)
Eosinophils Relative: 1.6 % (ref 0.0–5.0)
Eosinophils Relative: 1 % (ref 0–5)
HCT: 36.1 % (ref 36.0–46.0)
HCT: 34.2 % — ABNORMAL LOW (ref 36.0–46.0)
Lymphocytes Relative: 24.7 % (ref 12.0–46.0)
Lymphocytes Relative: 25 % (ref 12–46)
Lymphs Abs: 1.6 10*3/uL (ref 0.7–4.0)
Lymphs Abs: 1.6 10*3/uL (ref 0.7–4.0)
MCHC: 33.3 g/dL (ref 30.0–36.0)
MCV: 85.2 fl (ref 78.0–100.0)
MCV: 85.1 fL (ref 78.0–100.0)
Monocytes Absolute: 0.4 10*3/uL (ref 0.1–1.0)
Monocytes Absolute: 0.4 10*3/uL (ref 0.1–1.0)
Monocytes Relative: 6.6 % (ref 3.0–12.0)
Platelets: 296 10*3/uL (ref 150.0–400.0)
RBC: 4.24 Mil/uL (ref 3.87–5.11)
RBC: 4.02 MIL/uL (ref 3.87–5.11)
RDW: 13.2 % (ref 11.5–14.6)
RDW: 12.9 % (ref 11.5–15.5)
WBC: 6.5 10*3/uL (ref 4.5–10.5)
WBC: 6.3 10*3/uL (ref 4.0–10.5)

## 2013-03-04 LAB — COMPREHENSIVE METABOLIC PANEL
ALT: 18 U/L (ref 0–35)
Alkaline Phosphatase: 90 U/L (ref 39–117)
BUN: 16 mg/dL (ref 6–23)
CO2: 31 mEq/L (ref 19–32)
Calcium: 9.8 mg/dL (ref 8.4–10.5)
Creatinine, Ser: 1 mg/dL (ref 0.4–1.2)
GFR: 61.27 mL/min (ref >60.00)
Glucose, Bld: 93 mg/dL (ref 70–99)
Total Bilirubin: 0.5 mg/dL (ref 0.3–1.2)

## 2013-03-04 LAB — BASIC METABOLIC PANEL
BUN: 13 mg/dL (ref 6–23)
CO2: 25 mEq/L (ref 19–32)
Chloride: 100 mEq/L (ref 96–112)
Creatinine, Ser: 0.83 mg/dL (ref 0.50–1.10)
Sodium: 135 mEq/L (ref 135–145)

## 2013-03-04 LAB — HEPATIC FUNCTION PANEL
ALT: 18 U/L (ref 0–35)
ALT: 13 U/L (ref 0–35)
AST: 30 U/L (ref 0–37)
AST: 32 U/L (ref 0–37)
Albumin: 4 g/dL (ref 3.5–5.2)
Alkaline Phosphatase: 90 U/L (ref 39–117)
Alkaline Phosphatase: 93 U/L (ref 39–117)
Bilirubin, Direct: 0.1 mg/dL (ref 0.0–0.3)
Bilirubin, Direct: <0.1 mg/dL (ref 0.0–0.3)
Total Bilirubin: 0.5 mg/dL (ref 0.3–1.2)
Total Bilirubin: 0.2 mg/dL — ABNORMAL LOW (ref 0.3–1.2)

## 2013-03-04 LAB — LIPASE: Lipase: 20 U/L (ref 11.0–59.0)

## 2013-03-04 LAB — LIPASE, BLOOD: Lipase: 13 U/L (ref 11–59)

## 2013-03-04 MED ORDER — BENZONATATE 100 MG PO CAPS: 200.0000 mg | ORAL_CAPSULE | Freq: Three times a day (TID) | ORAL | Status: DC | PRN

## 2013-03-04 MED ORDER — POLYETHYLENE GLYCOL 3350 17 G PO PACK: 17.0000 g | PACK | Freq: Every day | ORAL | Status: DC

## 2013-03-04 MED ORDER — MORPHINE SULFATE 4 MG/ML IJ SOLN
8.0000 mg | Freq: Once | INTRAMUSCULAR | Status: AC
  Administered 2013-03-04: 8 mg via INTRAVENOUS
  Filled 2013-03-04: qty 2

## 2013-03-04 MED ORDER — PROMETHAZINE HCL 25 MG/ML IJ SOLN
25.0000 mg | Freq: Once | INTRAMUSCULAR | Status: AC
  Administered 2013-03-04: 25 mg via INTRAVENOUS
  Filled 2013-03-04: qty 1

## 2013-03-04 MED ORDER — HYDROMORPHONE HCL PF 1 MG/ML IJ SOLN
1.0000 mg | Freq: Once | INTRAMUSCULAR | Status: AC
  Administered 2013-03-04: 1 mg via INTRAVENOUS
  Filled 2013-03-04: qty 1

## 2013-03-04 MED ORDER — FUROSEMIDE 20 MG PO TABS: 20.0000 mg | ORAL_TABLET | Freq: Every day | ORAL | Status: DC

## 2013-03-04 MED ORDER — IOHEXOL 300 MG/ML  SOLN
100.0000 mL | Freq: Once | INTRAMUSCULAR | Status: AC | PRN
  Administered 2013-03-04: 100 mL via INTRAVENOUS

## 2013-03-04 MED ORDER — POLYETHYLENE GLYCOL 3350 17 G PO PACK
17.0000 g | PACK | Freq: Every day | ORAL | Status: DC
  Administered 2013-03-04: 17 g via ORAL
  Filled 2013-03-04: qty 1

## 2013-03-04 MED ORDER — HYDROMORPHONE HCL PF 1 MG/ML IJ SOLN
1.0000 mg | INTRAMUSCULAR | Status: AC
  Administered 2013-03-04: 1 mg via INTRAVENOUS
  Filled 2013-03-04: qty 1

## 2013-03-04 NOTE — ED Notes (Signed)
Spoke with lab about adding on additional tests.
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
## 2013-03-04 NOTE — Telephone Encounter (Signed)
Noted. Call [facility] and let them know she won't show.

## 2013-03-04 NOTE — Assessment & Plan Note (Signed)
Persistently elevated BP on metoprolol 25 mg BID with HR 66. Asymptomatic.  Plan Continue metoprolol  Furosemide 20 mg once a day  Monitor BP
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
## 2013-03-04 NOTE — Telephone Encounter (Signed)
[name] called states she is almost finished drinking the contrast for the CT ordered.  HOwever she feels like she is about to pass out within the next 10 minutes.  Pt states she is calling EMS to transport to [facility] ER.  Please advise

## 2013-03-04 NOTE — Assessment & Plan Note (Addendum)
Persistent and severe abdominal bloating and pain. She has had success with fleets for impaction but has continued to have diffuse pain.  Plan Lab: CBCD r/o infection, LFTS, Bmet  CT abdomen/pelvis   Adendum: patient called about 2PM to report that she was feeling feint and had called 911 to take her to hospital ED.

## 2013-03-04 NOTE — Patient Instructions (Signed)
1. Blood pressure - poor control. Continue the metoprolol 25 mg twice a day. Will add furosemide 20 mg once a day  Plan Monitor BP at home and report back in 3-5 days.  2. Abdominal pain - exam does not suggest a "surgical" abdomen: no obstruction or intra-abdominal catastrophe  Plan Labs: CBC to rule out infection; Metabolic panel and liver functions to help with assessment  CT abdomen and pelvis at 4:00 today at [facility]  [address]   Start contrast material: first bottle at 2:00, second bottle at 3:00

## 2013-03-04 NOTE — Progress Notes (Signed)
Pre visit review using our clinic review tool, if applicable. No additional management support is needed unless otherwise documented below in the visit note. 

## 2013-03-04 NOTE — Telephone Encounter (Signed)
CT notified pt in ER

## 2013-03-04 NOTE — ED Notes (Signed)
PT was scheduled at 1600 for CT scans. Has drunk contrast today. Went to see MD 2 weeks ago and he said she had blockage. She has been in intense pain since. Called office bc she felt like she was going to pass out from pain. MD sent her here.

## 2013-03-05 ENCOUNTER — Telehealth: Payer: Self-pay | Admitting: *Deleted

## 2013-03-05 ENCOUNTER — Ambulatory Visit (INDEPENDENT_AMBULATORY_CARE_PROVIDER_SITE_OTHER)
Admission: RE | Admit: 2013-03-05 | Discharge: 2013-03-05 | Disposition: A | Discharge status: Home / Self Care | Payer: Self-pay | Source: Ambulatory Visit | Attending: Gastroenterology | Admitting: Gastroenterology

## 2013-03-05 ENCOUNTER — Ambulatory Visit (INDEPENDENT_AMBULATORY_CARE_PROVIDER_SITE_OTHER): Payer: Self-pay | Admitting: Gastroenterology

## 2013-03-05 ENCOUNTER — Encounter: Payer: Self-pay | Admitting: Gastroenterology

## 2013-03-05 VITALS — BP 152/74 | HR 76 | Ht 66.0 in | Wt 137.8 lb

## 2013-03-05 DIAGNOSIS — K5900 Constipation, unspecified: Secondary | ICD-10-CM

## 2013-03-05 DIAGNOSIS — R194 Change in bowel habit: Secondary | ICD-10-CM

## 2013-03-05 DIAGNOSIS — R198 Other specified symptoms and signs involving the digestive system and abdomen: Secondary | ICD-10-CM

## 2013-03-05 MED ORDER — ONDANSETRON 4 MG PO TBDP: 4.0000 mg | ORAL_TABLET | Freq: Four times a day (QID) | ORAL | Status: DC | PRN

## 2013-03-05 MED ORDER — SOD PICOSULFATE-MAG OX-CIT ACD 10-3.5-12 MG-GM-GM PO PACK: 1.0000 | PACK | Freq: Once | ORAL | Status: DC

## 2013-03-05 MED ORDER — LUBIPROSTONE 24 MCG PO CAPS: 24.0000 ug | ORAL_CAPSULE | Freq: Two times a day (BID) | ORAL | Status: DC

## 2013-03-05 NOTE — Patient Instructions (Addendum)
Please go to our radiology department in the basement today and get abdominal films done.  Patient waited for report, some stool still in colon but not like on CT scan, the contrast passed out.  Purchase a Magnesium Citrate and drink tonight.  You have been scheduled for a colonoscopy with propofol. Please follow written instructions given to you at your visit today.  Please pick up your prep kit at the pharmacy within the next 1-3 days. If you use inhalers (even only as needed), please bring them with you on the day of your procedure. Your physician has requested that you go to www.startemmi.com and enter the access code given to you at your visit today. This web site gives a general overview about your procedure. However, you should still follow specific instructions given to you by our office regarding your preparation for the procedure.  We are supplying you with Amitiza 24 mcg samples today:  Take one before/with meals twice a day.  We have sent the following medications to your pharmacy for you to pick up at your convenience: zofran  I appreciate the opportunity to care for you.

## 2013-03-05 NOTE — Telephone Encounter (Signed)
Spoke to pt advised of Mds message

## 2013-03-05 NOTE — Telephone Encounter (Signed)
Pt called states she did have the abd Ct while in the ER yesterday.  States it showed a lot of stool.  Pt further states she woke up this morning in stool due to apparent diarrhea during her sleep.  Pt is requesting a referral to Dr Dickie La, GI for a colonoscopy.  Please advise

## 2013-03-05 NOTE — Telephone Encounter (Signed)
K. Request in to [facility]

## 2013-03-06 ENCOUNTER — Telehealth: Payer: Self-pay | Admitting: *Deleted

## 2013-03-06 NOTE — Telephone Encounter (Signed)
I received a fax from [facility]. The insurance company does not cover the colonoscopy prep, Prepopik.  I called [name] to advise her we have a sample prep she can pick up here at our office. She was grateful and will be here to pick it up either today or tomorrow.

## 2013-03-12 ENCOUNTER — Encounter: Payer: Self-pay | Admitting: Gastroenterology

## 2013-03-12 DIAGNOSIS — K5900 Constipation, unspecified: Secondary | ICD-10-CM | POA: Insufficient documentation

## 2013-03-12 DIAGNOSIS — R194 Change in bowel habit: Secondary | ICD-10-CM | POA: Insufficient documentation

## 2013-03-12 NOTE — Progress Notes (Signed)
Reviewed and agree. Probiotics may help  To maintain small bowl flora
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
## 2013-03-19 ENCOUNTER — Telehealth: Payer: Self-pay | Admitting: *Deleted

## 2013-03-19 DIAGNOSIS — R110 Nausea: Secondary | ICD-10-CM

## 2013-03-19 DIAGNOSIS — K599 Functional intestinal disorder, unspecified: Secondary | ICD-10-CM

## 2013-03-19 DIAGNOSIS — R109 Unspecified abdominal pain: Secondary | ICD-10-CM

## 2013-03-19 NOTE — Telephone Encounter (Signed)
Pt called states she is in "terrible pain" and is nauseated.  She also states she does not have another appoint with GI until December.  She is requesting your help with the pain.  Please advise

## 2013-03-19 NOTE — Telephone Encounter (Signed)
Please direct call to GI: Ms. Cristi Loron or Dr. Juanda Chance - thanks

## 2013-03-20 NOTE — Telephone Encounter (Signed)
I have read the letter pt sent me last week.She clearly has a chronic problem with pain management. She has colonic inertia and had subtotal colectomy in  The past, last flex sigm was in 1/ 2011. Please give her a colonoscopy prep and set her up for flex sigm at [facility] this week, coscious sedation. I will talk to her at that point. But we are not going to take over her pain management.

## 2013-03-24 ENCOUNTER — Other Ambulatory Visit: Payer: Self-pay | Admitting: *Deleted

## 2013-03-24 ENCOUNTER — Encounter: Payer: Self-pay | Admitting: *Deleted

## 2013-03-24 ENCOUNTER — Encounter (HOSPITAL_COMMUNITY): Payer: Self-pay | Admitting: Pharmacy Technician

## 2013-03-24 DIAGNOSIS — R109 Unspecified abdominal pain: Secondary | ICD-10-CM

## 2013-03-24 DIAGNOSIS — K599 Functional intestinal disorder, unspecified: Secondary | ICD-10-CM

## 2013-03-24 DIAGNOSIS — R110 Nausea: Secondary | ICD-10-CM

## 2013-03-24 MED ORDER — PEG-KCL-NACL-NASULF-NA ASC-C 100 G PO SOLR: 1.0000 | ORAL | Status: DC

## 2013-03-24 NOTE — Telephone Encounter (Signed)
Spoke with Noreene Larsson at [facility] Endo and scheduled patient for Flex sig with conscious sedation on 03/26/13 at 11:15 AM. Spoke with patient and she will come by today for instructions and to pick up coupon for Moviprep.

## 2013-03-24 NOTE — Addendum Note (Signed)
Addended by: Daphine Deutscher on: 03/24/2013 10:03 AM   Modules accepted: Orders, Medications

## 2013-03-25 ENCOUNTER — Encounter (HOSPITAL_COMMUNITY): Payer: Self-pay | Admitting: *Deleted

## 2013-03-26 ENCOUNTER — Encounter (HOSPITAL_COMMUNITY): Payer: Self-pay | Admitting: *Deleted

## 2013-03-26 ENCOUNTER — Encounter (HOSPITAL_COMMUNITY): Payer: Self-pay | Admitting: Anesthesiology

## 2013-03-26 ENCOUNTER — Telehealth: Payer: Self-pay | Admitting: *Deleted

## 2013-03-26 ENCOUNTER — Encounter (HOSPITAL_COMMUNITY)
Admission: RE | Disposition: A | Discharge status: Home / Self Care | Payer: Self-pay | Source: Ambulatory Visit | Attending: Internal Medicine

## 2013-03-26 ENCOUNTER — Ambulatory Visit (HOSPITAL_COMMUNITY)
Admission: RE | Admit: 2013-03-26 | Discharge: 2013-03-26 | Disposition: A | Discharge status: Home / Self Care | Payer: Self-pay | Source: Ambulatory Visit | Attending: Internal Medicine | Admitting: Internal Medicine

## 2013-03-26 ENCOUNTER — Ambulatory Visit (HOSPITAL_COMMUNITY): Payer: Self-pay | Admitting: Anesthesiology

## 2013-03-26 DIAGNOSIS — Z79899 Other long term (current) drug therapy: Secondary | ICD-10-CM | POA: Insufficient documentation

## 2013-03-26 DIAGNOSIS — K5989 Other specified functional intestinal disorders: Secondary | ICD-10-CM | POA: Insufficient documentation

## 2013-03-26 DIAGNOSIS — IMO0001 Reserved for inherently not codable concepts without codable children: Secondary | ICD-10-CM | POA: Insufficient documentation

## 2013-03-26 DIAGNOSIS — I10 Essential (primary) hypertension: Secondary | ICD-10-CM | POA: Insufficient documentation

## 2013-03-26 DIAGNOSIS — J45909 Unspecified asthma, uncomplicated: Secondary | ICD-10-CM | POA: Insufficient documentation

## 2013-03-26 DIAGNOSIS — K5900 Constipation, unspecified: Secondary | ICD-10-CM

## 2013-03-26 DIAGNOSIS — R141 Gas pain: Secondary | ICD-10-CM | POA: Insufficient documentation

## 2013-03-26 DIAGNOSIS — E785 Hyperlipidemia, unspecified: Secondary | ICD-10-CM | POA: Insufficient documentation

## 2013-03-26 DIAGNOSIS — R159 Full incontinence of feces: Secondary | ICD-10-CM | POA: Insufficient documentation

## 2013-03-26 DIAGNOSIS — R109 Unspecified abdominal pain: Secondary | ICD-10-CM

## 2013-03-26 DIAGNOSIS — E039 Hypothyroidism, unspecified: Secondary | ICD-10-CM | POA: Insufficient documentation

## 2013-03-26 DIAGNOSIS — Z9049 Acquired absence of other specified parts of digestive tract: Secondary | ICD-10-CM | POA: Insufficient documentation

## 2013-03-26 DIAGNOSIS — R110 Nausea: Secondary | ICD-10-CM

## 2013-03-26 DIAGNOSIS — R142 Eructation: Secondary | ICD-10-CM | POA: Insufficient documentation

## 2013-03-26 DIAGNOSIS — K599 Functional intestinal disorder, unspecified: Secondary | ICD-10-CM

## 2013-03-26 HISTORY — PX: FLEXIBLE SIGMOIDOSCOPY: SHX5431

## 2013-03-26 SURGERY — SIGMOIDOSCOPY, FLEXIBLE
Anesthesia: Monitor Anesthesia Care

## 2013-03-26 MED ORDER — PROPOFOL INFUSION 10 MG/ML OPTIME
INTRAVENOUS | Status: DC | PRN
  Administered 2013-03-26: 60 ug/kg/min via INTRAVENOUS

## 2013-03-26 MED ORDER — HYOSCYAMINE SULFATE 0.125 MG SL SUBL: 0.1250 mg | SUBLINGUAL_TABLET | SUBLINGUAL | Status: DC | PRN

## 2013-03-26 MED ORDER — SODIUM CHLORIDE 0.9 % IV SOLN: INTRAVENOUS | Status: DC

## 2013-03-26 MED ORDER — MIDAZOLAM HCL 5 MG/5ML IJ SOLN
INTRAMUSCULAR | Status: DC | PRN
  Administered 2013-03-26: 2 mg via INTRAVENOUS

## 2013-03-26 MED ORDER — PROPOFOL 10 MG/ML IV BOLUS
INTRAVENOUS | Status: AC
  Filled 2013-03-26: qty 20

## 2013-03-26 MED ORDER — KETAMINE HCL 50 MG/ML IJ SOLN
INTRAMUSCULAR | Status: DC | PRN
  Administered 2013-03-26: 25 mg via INTRAMUSCULAR

## 2013-03-26 MED ORDER — LACTATED RINGERS IV SOLN
INTRAVENOUS | Status: DC
  Administered 2013-03-26: 10:00:00 via INTRAVENOUS

## 2013-03-26 MED ORDER — MIDAZOLAM HCL 2 MG/2ML IJ SOLN
INTRAMUSCULAR | Status: AC
  Filled 2013-03-26: qty 2

## 2013-03-26 NOTE — Telephone Encounter (Signed)
Scheduled UGI with small bowel follow through at [facility] radiology([place]) on 03/31/13 at 10:30 AM. NPO after midnight.
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
## 2013-03-26 NOTE — Interval H&P Note (Signed)
History and Physical Interval Note:  03/26/2013 10:37 AM  [name]  has presented today for surgery, with the diagnosis of Colonic inertia, abdominal pain, nausea  The various methods of treatment have been discussed with the patient and family. After consideration of risks, benefits and other options for treatment, the patient has consented to  Procedure(s): FLEXIBLE SIGMOIDOSCOPY (N/A) as a surgical intervention .  The patient's history has been reviewed, patient examined, no change in status, stable for surgery.  I have reviewed the patient's chart and labs.  Questions were answered to the patient's satisfaction.     Lina Sar

## 2013-03-26 NOTE — Anesthesia Postprocedure Evaluation (Signed)
  Anesthesia Post-op Note  Patient: [name]  Procedure(s) Performed: Procedure(s) (LRB): FLEXIBLE SIGMOIDOSCOPY (N/A)  Patient Location: PACU  Anesthesia Type: MAC  Level of Consciousness: awake and alert   Airway and Oxygen Therapy: Patient Spontanous Breathing  Post-op Pain: mild  Post-op Assessment: Post-op Vital signs reviewed, Patient's Cardiovascular Status Stable, Respiratory Function Stable, Patent Airway and No signs of Nausea or vomiting  Last Vitals:  Filed Vitals:   03/26/13 1200  BP: 132/75  Temp:   Resp: 19    Post-op Vital Signs: stable   Complications: No apparent anesthesia complications

## 2013-03-26 NOTE — Op Note (Signed)
[facility] [address] [place], [address]   FLEXIBLE SIGMOIDOSCOPY PROCEDURE REPORT  PATIENT: [name], [name]  MR#: [id number] BIRTHDATE: 1950/07/22 , 62  yrs. old GENDER: Female ENDOSCOPIST: Hart Carwin, MD REFERRED BY: Jacques Navy, M.D. PROCEDURE DATE:  03/26/2013 PROCEDURE:   Sigmoidoscopy with biopsy ASA CLASS:   Class III INDICATIONS:history of colonic inertia.  Status post subtotal colectomy in 2002 and partial colectomy in 1991.  Chronic abdominal pain.  Narcotic dependence.  Recent constipation.  Last colonoscopy January 2011 showed the patent anastomosis at 20 cm. MEDICATIONS: MAC sedation, administered by CRNA  DESCRIPTION OF PROCEDURE:   After the risks benefits and alternatives of the procedure were thoroughly explained, informed consent was obtained.  revealed no abnormalities of the rectum. The [id number] ([id number]) and [id number] ([id number])  endoscope was introduced through the anus  and advanced to distal ileum at 60 cm      , limited by No adverse events experienced.   The quality of the prep was good .  The instrument was then slowly withdrawn as the mucosa was fully examined.       Rectal sphincter tone was decreased. The rectal ampulla was spacious. Colonic mucosa appeared normal from 0-20 cm to the anastomosis which was widely patent. Small bowel was visualized and appeared normal. There was  small amounts also retained stool in the distal small bowel. Multiple biopsies were taken from 60 cm. There was no evidence of anastomotic stricture or obstruction[ The scope was then withdrawn from the patient and the procedure terminated.  COMPLICATIONS: There were no complications.  ENDOSCOPIC IMPRESSION: status post subtotal colectomy with 20 cm the remaining colon Widely patent ileocolic anastomosis Normal distal ileum to 60 cm status post biopsies  RECOMMENDATIONS: This exam does not explain patient's abdominal pain. There is  a possibility of the narcotic withdrawal causing nausea vomiting and crampy abdominal pain Consider gastric emptying scan to assess for gastroparesis, another possibility is a small bowl adhesive disease. We will proceed with the upper GI series with small bowel follow-through. to assess transit time Continue MiraLax 9-17 g daily Levsin sublingually 0.125 mg when necessary cramping abdominal pain  REPEAT EXAM: 10 years   _______________________________ eSignedHart Carwin, MD 03/26/2013 12:06 PM   CC:  PATIENT NAME:  [name] MR#: [id number]

## 2013-03-26 NOTE — Anesthesia Preprocedure Evaluation (Addendum)
Anesthesia Evaluation  Patient identified by MRN, date of birth, ID band Patient awake    Reviewed: Allergy & Precautions, H&P , NPO status , Patient's Chart, lab work & pertinent test results, reviewed documented beta blocker date and time   Airway Mallampati: II TM Distance: >3 FB Neck ROM: full    Dental no notable dental hx. (+) Teeth Intact and Dental Advisory Given   Pulmonary neg pulmonary ROS,  Chronic hoarseness breath sounds clear to auscultation  Pulmonary exam normal       Cardiovascular Exercise Tolerance: Good hypertension, Pt. on home beta blockers Rhythm:regular Rate:Normal     Neuro/Psych  Headaches, Anxiety Depression Guillain-Barre 2007 negative neurological ROS  negative psych ROS   GI/Hepatic negative GI ROS, Neg liver ROS, GERD-  Medicated and Controlled,  Endo/Other  negative endocrine ROSHypothyroidism   Renal/GU negative Renal ROS  negative genitourinary   Musculoskeletal  (+) Fibromyalgia -  Abdominal   Peds  Hematology negative hematology ROS (+)   Anesthesia Other Findings Chronic methadone  Reproductive/Obstetrics negative OB ROS                          Anesthesia Physical Anesthesia Plan  ASA: II  Anesthesia Plan: MAC   Post-op Pain Management:    Induction:   Airway Management Planned: Simple Face Mask  Additional Equipment:   Intra-op Plan:   Post-operative Plan:   Informed Consent: I have reviewed the patients History and Physical, chart, labs and discussed the procedure including the risks, benefits and alternatives for the proposed anesthesia with the patient or authorized representative who has indicated his/her understanding and acceptance.   Dental Advisory Given  Plan Discussed with: CRNA and Surgeon  Anesthesia Plan Comments:         Anesthesia Quick Evaluation

## 2013-03-26 NOTE — Transfer of Care (Signed)
Immediate Anesthesia Transfer of Care Note  Patient: [name]  Procedure(s) Performed: Procedure(s): FLEXIBLE SIGMOIDOSCOPY (N/A)  Patient Location: PACU  Anesthesia Type:MAC  Level of Consciousness: sedated  Airway & Oxygen Therapy: Patient Spontanous Breathing and Patient connected to nasal cannula oxygen  Post-op Assessment: Report given to PACU RN and Post -op Vital signs reviewed and stable  Post vital signs: Reviewed and stable  Complications: No apparent anesthesia complications

## 2013-03-27 ENCOUNTER — Encounter (HOSPITAL_COMMUNITY): Payer: Self-pay | Admitting: Internal Medicine

## 2013-03-27 ENCOUNTER — Encounter: Payer: Self-pay | Admitting: Internal Medicine

## 2013-03-27 NOTE — Telephone Encounter (Signed)
Patient given appointment date, time and instructions. 

## 2013-03-31 ENCOUNTER — Ambulatory Visit (HOSPITAL_COMMUNITY)
Admission: RE | Admit: 2013-03-31 | Discharge: 2013-03-31 | Disposition: A | Discharge status: Home / Self Care | Payer: Self-pay | Source: Ambulatory Visit | Attending: Internal Medicine | Admitting: Internal Medicine

## 2013-03-31 DIAGNOSIS — K5901 Slow transit constipation: Secondary | ICD-10-CM | POA: Insufficient documentation

## 2013-03-31 DIAGNOSIS — R109 Unspecified abdominal pain: Secondary | ICD-10-CM | POA: Insufficient documentation

## 2013-03-31 DIAGNOSIS — K5900 Constipation, unspecified: Secondary | ICD-10-CM

## 2013-04-01 ENCOUNTER — Other Ambulatory Visit: Payer: Self-pay | Admitting: *Deleted

## 2013-04-01 MED ORDER — LACTULOSE 10 GM/15ML PO SOLN: ORAL | Status: DC

## 2013-04-02 ENCOUNTER — Encounter: Payer: Self-pay | Admitting: Internal Medicine

## 2013-04-02 ENCOUNTER — Ambulatory Visit (INDEPENDENT_AMBULATORY_CARE_PROVIDER_SITE_OTHER): Payer: Self-pay | Admitting: Internal Medicine

## 2013-04-02 VITALS — BP 128/78 | HR 59 | Temp 99.5°F | Wt 143.0 lb

## 2013-04-02 DIAGNOSIS — K589 Irritable bowel syndrome without diarrhea: Secondary | ICD-10-CM

## 2013-04-02 DIAGNOSIS — K5900 Constipation, unspecified: Secondary | ICD-10-CM

## 2013-04-02 DIAGNOSIS — IMO0001 Reserved for inherently not codable concepts without codable children: Secondary | ICD-10-CM

## 2013-04-02 DIAGNOSIS — S93401A Sprain of unspecified ligament of right ankle, initial encounter: Secondary | ICD-10-CM

## 2013-04-02 DIAGNOSIS — S93409A Sprain of unspecified ligament of unspecified ankle, initial encounter: Secondary | ICD-10-CM

## 2013-04-02 MED ORDER — METHADONE HCL 10 MG PO TABS: 30.0000 mg | ORAL_TABLET | Freq: Four times a day (QID) | ORAL | Status: DC

## 2013-04-02 NOTE — Patient Instructions (Signed)
Pleasure to see you today [name]! Hope your abdominal pain continues to improve.   1) Hope your ankle continues to heal. Please continue to take care of your ankle as you have been with RICE: Rest, Ice, Compression and Elevation. You can take ibuprofen as needed for pain/swelling.   2) With regards to disability, you should put in an application. A disability lawyer may be helpful with the process. Nearly everyone is denied the first time. Dr. Debby Bud will be more than happy to fill out any of the necessary paperwork.

## 2013-04-02 NOTE — Progress Notes (Signed)
Pre visit review using our clinic review tool, if applicable. No additional management support is needed unless otherwise documented below in the visit note. 
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
## 2013-04-03 NOTE — Assessment & Plan Note (Signed)
Followed by Dr. Juanda Chance. Work-up has been thorough.  Plan Continue present regimen as directed by Dr. Juanda Chance

## 2013-04-03 NOTE — Assessment & Plan Note (Signed)
Chronic constipation with motility disorder.  Plan Lactulose and miralax.

## 2013-04-03 NOTE — Assessment & Plan Note (Signed)
Reviewed the contributory effects of methadone on chronic constipation. She knows to follow her GI regimen.  Plan Refilled methadone for 3 months.

## 2013-04-08 ENCOUNTER — Encounter: Payer: Self-pay | Admitting: *Deleted

## 2013-04-08 ENCOUNTER — Telehealth: Payer: Self-pay | Admitting: *Deleted

## 2013-04-08 NOTE — Telephone Encounter (Signed)
While getting pt's chart ready for tomorrow, Clinical research associate noted that pt had a flex sig at [facility] on 03-26-13 (after she saw the PA in the office on 03-05-13 and set up the colonoscopy).  Writer phoned down to the 3rd floor and spoke with Dr. Juanda Chance about this.  I mentioned the pt's name to see if Dr. Juanda Chance had any memory of this pt and offered to send information as a phone note.  Dr. Juanda Chance states, "Don't question it- pt is to to come in as scheduled"

## 2013-04-09 ENCOUNTER — Encounter: Payer: Self-pay | Admitting: Internal Medicine

## 2013-04-09 ENCOUNTER — Other Ambulatory Visit: Payer: Self-pay

## 2013-04-09 MED ORDER — METOPROLOL TARTRATE 25 MG PO TABS: 25.0000 mg | ORAL_TABLET | Freq: Two times a day (BID) | ORAL | Status: DC

## 2013-04-27 ENCOUNTER — Other Ambulatory Visit: Payer: Self-pay | Admitting: Internal Medicine

## 2013-04-27 DIAGNOSIS — R350 Frequency of micturition: Secondary | ICD-10-CM

## 2013-04-27 DIAGNOSIS — R300 Dysuria: Secondary | ICD-10-CM

## 2013-04-28 NOTE — Telephone Encounter (Signed)
Phone call to patient stating she is having dysuria and urinary frequency. I advised her she will need to come to the lab for urinalysis. Order has been placed.

## 2013-04-28 NOTE — Telephone Encounter (Signed)
Need more information as to why she needs another course of antibiotics. If she has UTI symptoms will need to come to the lab for a U/A

## 2013-04-29 ENCOUNTER — Other Ambulatory Visit (INDEPENDENT_AMBULATORY_CARE_PROVIDER_SITE_OTHER): Payer: Self-pay

## 2013-04-29 DIAGNOSIS — R300 Dysuria: Secondary | ICD-10-CM

## 2013-04-29 DIAGNOSIS — R350 Frequency of micturition: Secondary | ICD-10-CM

## 2013-04-29 LAB — URINALYSIS, ROUTINE W REFLEX MICROSCOPIC
Bilirubin Urine: NEGATIVE
Ketones, ur: NEGATIVE
NITRITE: NEGATIVE
Specific Gravity, Urine: 1.02 (ref 1.000–1.030)
TOTAL PROTEIN, URINE-UPE24: NEGATIVE
Urine Glucose: NEGATIVE
Urobilinogen, UA: 0.2 (ref 0.0–1.0)
pH: 7 (ref 5.0–8.0)

## 2013-04-30 ENCOUNTER — Telehealth: Payer: Self-pay

## 2013-04-30 MED ORDER — CIPROFLOXACIN HCL 250 MG PO TABS: 250.0000 mg | ORAL_TABLET | Freq: Two times a day (BID) | ORAL | Status: DC

## 2013-04-30 MED ORDER — NITROFURANTOIN MONOHYD MACRO 100 MG PO CAPS: 100.0000 mg | ORAL_CAPSULE | Freq: Two times a day (BID) | ORAL | Status: DC

## 2013-04-30 NOTE — Telephone Encounter (Signed)
Pt called to check up on this request, pt request for it to be done today if this is possible.

## 2013-04-30 NOTE — Telephone Encounter (Signed)
Phone call back to patient and she states Cipro is the only medication that clears up her UTI's

## 2013-04-30 NOTE — Telephone Encounter (Signed)
macrobid antibiotic sent to pharmacy for positive U/A

## 2013-04-30 NOTE — Telephone Encounter (Signed)
Hard to believe she would have a UTI resistant to macrodantin, which is why it was selected. This is the go to drug for recurrent UTI with any question of resistance.  However, will be happy to call in cipro - order to pharmacy done.

## 2013-04-30 NOTE — Telephone Encounter (Signed)
Phone call from patient stating she came in yesterday and left a urine. She has not heard back regarding this. She is having burning and frequency. Please advise.

## 2013-05-01 ENCOUNTER — Telehealth: Payer: Self-pay | Admitting: *Deleted

## 2013-05-01 NOTE — Telephone Encounter (Signed)
Attempted to notify patient that cipro has been sent to her pharmacy. Phone just rings, no voicemail

## 2013-05-01 NOTE — Telephone Encounter (Signed)
Patient phoned requesting confirmation that order for cipro be sent to [facility].  Phoned pt to notify her that it had been sent yesterday.  She verbalized understanding & appreciation.

## 2013-05-07 ENCOUNTER — Ambulatory Visit: Payer: Self-pay | Admitting: Gynecology

## 2013-05-10 ENCOUNTER — Other Ambulatory Visit: Payer: Self-pay | Admitting: Internal Medicine

## 2013-05-11 ENCOUNTER — Other Ambulatory Visit: Payer: Self-pay | Admitting: Internal Medicine

## 2013-05-12 ENCOUNTER — Other Ambulatory Visit: Payer: Self-pay

## 2013-05-12 MED ORDER — LEVOTHYROXINE SODIUM 50 MCG PO TABS: 50.0000 ug | ORAL_TABLET | Freq: Every day | ORAL | Status: DC

## 2013-05-20 ENCOUNTER — Ambulatory Visit: Payer: Self-pay | Admitting: Internal Medicine

## 2013-06-08 IMAGING — CR DG CHEST 2V
2 series · 2 of 2 positions shown · non-contrast
Comparison: October 06, 2010

CLINICAL DATA: Cough, shortness of breath, chest pain

CHEST - 2 VIEW

[view not recorded (1 of 2)]
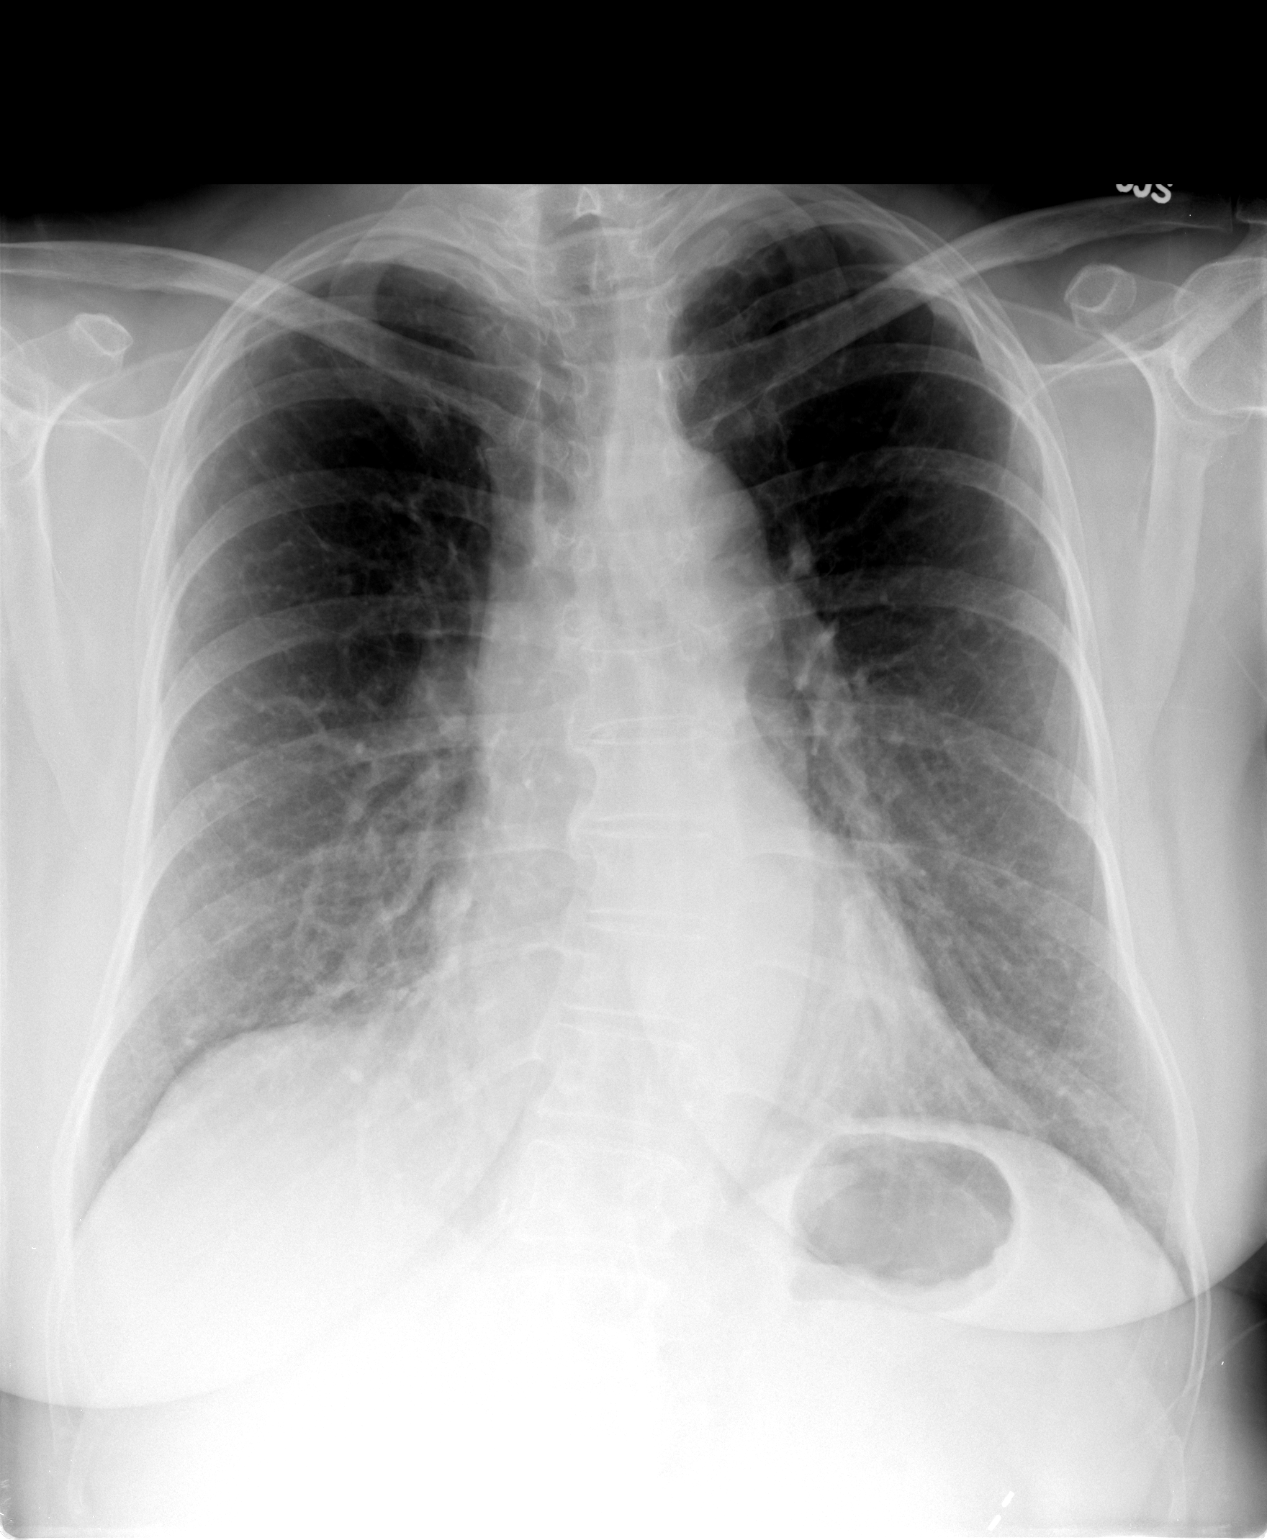

[view not recorded (2 of 2)]
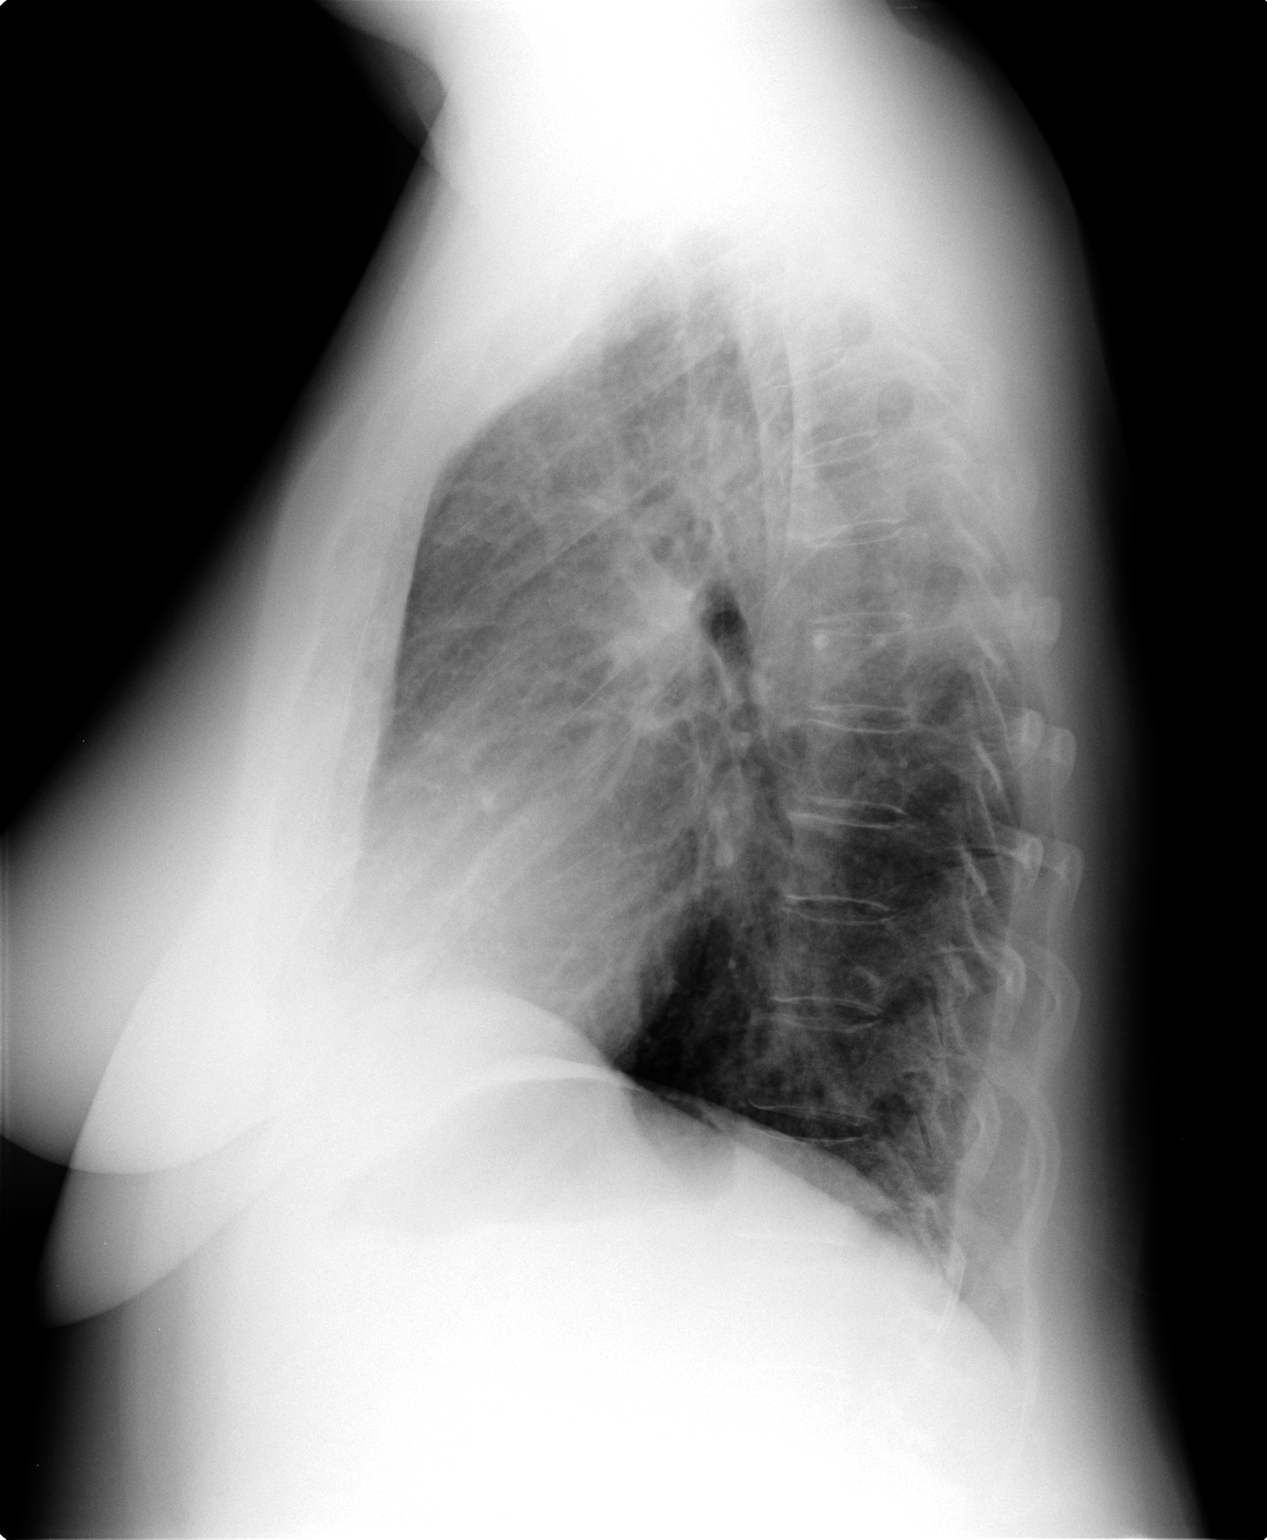

[2 of 2 positions shown; findings below may reference images not displayed]

FINDINGS: The cardiac silhouette, mediastinum, pulmonary
vasculature are within normal limits.  Both lungs are clear.
There is no acute bony abnormality.
IMPRESSION: There is no evidence of acute cardiac or pulmonary process.

## 2013-06-18 ENCOUNTER — Other Ambulatory Visit (INDEPENDENT_AMBULATORY_CARE_PROVIDER_SITE_OTHER): Payer: Self-pay

## 2013-06-18 ENCOUNTER — Ambulatory Visit (INDEPENDENT_AMBULATORY_CARE_PROVIDER_SITE_OTHER)
Admission: RE | Admit: 2013-06-18 | Discharge: 2013-06-18 | Disposition: A | Discharge status: Home / Self Care | Payer: Self-pay | Source: Ambulatory Visit | Attending: Internal Medicine | Admitting: Internal Medicine

## 2013-06-18 ENCOUNTER — Ambulatory Visit (INDEPENDENT_AMBULATORY_CARE_PROVIDER_SITE_OTHER): Payer: Self-pay | Admitting: Internal Medicine

## 2013-06-18 ENCOUNTER — Encounter: Payer: Self-pay | Admitting: Internal Medicine

## 2013-06-18 VITALS — BP 150/90 | HR 57 | Temp 98.6°F | Wt 148.2 lb

## 2013-06-18 DIAGNOSIS — R059 Cough, unspecified: Secondary | ICD-10-CM

## 2013-06-18 DIAGNOSIS — E876 Hypokalemia: Secondary | ICD-10-CM

## 2013-06-18 DIAGNOSIS — R300 Dysuria: Secondary | ICD-10-CM

## 2013-06-18 DIAGNOSIS — R053 Chronic cough: Secondary | ICD-10-CM

## 2013-06-18 DIAGNOSIS — R079 Chest pain, unspecified: Secondary | ICD-10-CM

## 2013-06-18 DIAGNOSIS — R030 Elevated blood-pressure reading, without diagnosis of hypertension: Secondary | ICD-10-CM

## 2013-06-18 DIAGNOSIS — R05 Cough: Secondary | ICD-10-CM

## 2013-06-18 DIAGNOSIS — R252 Cramp and spasm: Secondary | ICD-10-CM

## 2013-06-18 LAB — CBC WITH DIFFERENTIAL/PLATELET
BASOS PCT: 0.4 % (ref 0.0–3.0)
Basophils Absolute: 0 10*3/uL (ref 0.0–0.1)
EOS ABS: 0.1 10*3/uL (ref 0.0–0.7)
Eosinophils Relative: 1.2 % (ref 0.0–5.0)
HEMATOCRIT: 38.8 % (ref 36.0–46.0)
HEMOGLOBIN: 12.6 g/dL (ref 12.0–15.0)
LYMPHS ABS: 2.2 10*3/uL (ref 0.7–4.0)
LYMPHS PCT: 23.9 % (ref 12.0–46.0)
MCHC: 32.5 g/dL (ref 30.0–36.0)
MCV: 88.7 fl (ref 78.0–100.0)
Monocytes Absolute: 0.6 10*3/uL (ref 0.1–1.0)
Monocytes Relative: 7 % (ref 3.0–12.0)
NEUTROS ABS: 6.2 10*3/uL (ref 1.4–7.7)
Neutrophils Relative %: 67.5 % (ref 43.0–77.0)
Platelets: 330 10*3/uL (ref 150.0–400.0)
RBC: 4.38 Mil/uL (ref 3.87–5.11)
RDW: 13.6 % (ref 11.5–14.6)
WBC: 9.1 10*3/uL (ref 4.5–10.5)

## 2013-06-18 LAB — URINALYSIS, ROUTINE W REFLEX MICROSCOPIC
BILIRUBIN URINE: NEGATIVE
KETONES UR: NEGATIVE
Leukocytes, UA: NEGATIVE
Nitrite: NEGATIVE
Specific Gravity, Urine: >=1.03 — AB (ref 1.000–1.030)
Total Protein, Urine: NEGATIVE
UROBILINOGEN UA: 0.2 (ref 0.0–1.0)
Urine Glucose: NEGATIVE
pH: 6 (ref 5.0–8.0)

## 2013-06-18 MED ORDER — GABAPENTIN 100 MG PO CAPS: 100.0000 mg | ORAL_CAPSULE | Freq: Three times a day (TID) | ORAL | Status: DC

## 2013-06-18 NOTE — Patient Instructions (Signed)
1. Cough and pain with cough and inspiration - exam today is without sounds of consolidation (pneumonia) and there is no pleural rub. No medically significant fever. Coughing will lead to chest wall pain that can be severe.  Plan Lab: complete blood count  Chest x-ray  Hold antibiotics until there is more information  2. Dysuria -  Plan will check u/a and send urine for culture in order to target antibiotics  Azo-pyridiine for urinary burning and discomfort.  3. Cramps -  Plan Gabapentin 100 mg three times a day for cramps  4. Chronic pain - will mail you Rx for methadone. Have the pharmacist check to see if oxycontin 20 mg twice a day will be covered and at what price.

## 2013-06-18 NOTE — Progress Notes (Signed)
   Subjective:    Patient ID: [name], female    DOB: [birth year], 63 y.o.   MRN: [id number]  HPI [name] presents for intermittent fevers up to 102, she has rigors,m  she has persistent cough that is productive of scant sputum, she is short of breath, she can't take a deep breath due to loss of capacity and does  have pain with coughing and breathing.   Reviewed chart - last CXR Dec '13 - NAD, last CT chest June '12 - no PE, apical scarring.  She is sure she has a UTI - last UTI Jan 6th '15 - she states she never got better.  She is having diffuse cramping that is limiting her activities.   She is seeing Dr. Toy Care for psychiatry. She has brain fog that is unreal. They are working on her medications.   Review of Systems System review is negative for any constitutional, cardiac, pulmonary, GI or neuro symptoms or complaints other than as described in the HPI.     Objective:   Physical Exam Filed Vitals:   06/18/13 1133  BP: 150/90  Pulse: 57  Temp: 98.6 F (37 C)   Wt Readings from Last 3 Encounters:  06/18/13 148 lb 3.2 oz (67.223 kg)  04/02/13 143 lb (64.864 kg)  03/05/13 137 lb 12.8 oz (62.506 kg)   Gen'l- WNWD woman in no acute distress HEENT  C&S clear Neck - supple, no thyromegally Cor - 2+ radial, RRR Pulm - shallow respirations, no increased WOB, no rales, no wheezes, no pleural rub. Neuro - non-focal       Assessment & Plan:  1. Cough with fever - exam is unremarkable w/o findings to suggest PNA or pleurisy. Plan CXR  LAB - CBC  Recommendations to follow  Addendum: CBC normal with WBC 9.1 w/ normal diff.          CXR - FINDINGS:  The cardiac silhouette absent normal limits. The aorta is tortuous  and ectatic. S-shaped scoliosis appreciated within the thoracolumbar  spine. The lungs are clear.  IMPRESSION:  No evidence of acute cardiopulmonary disease.  Plan No indication for antibiotics  2. UTI - recent UTI treated but patient reports failure to  resolve on Cipro Plan U/A with micro reflect to culture  Addendum  U/A negative   3. Cramping - long standing problem, getting worse Plan Gabapentin 100 mg tid

## 2013-06-18 NOTE — Progress Notes (Signed)
Pre visit review using our clinic review tool, if applicable. No additional management support is needed unless otherwise documented below in the visit note. 

## 2013-06-19 ENCOUNTER — Telehealth: Payer: Self-pay | Admitting: Internal Medicine

## 2013-06-19 DIAGNOSIS — R252 Cramp and spasm: Secondary | ICD-10-CM | POA: Insufficient documentation

## 2013-06-19 LAB — URINE CULTURE
COLONY COUNT: NO GROWTH
Organism ID, Bacteria: NO GROWTH

## 2013-06-19 NOTE — Assessment & Plan Note (Signed)
[name] c/o diffuse severe cramping.  Plan Gabapentin 100 mg TID

## 2013-06-19 NOTE — Telephone Encounter (Signed)
Called patient with lab and x-ray results: no findings on CXR, lab is normal.  She is advised to see Dr. Annamaria Boots for respiratory issues but there is no infection to treat at this time; she is advised to see Urology for dysuria, frequency, etc since there is no evidence of infection.

## 2013-06-20 ENCOUNTER — Encounter: Payer: Self-pay | Admitting: Internal Medicine

## 2013-06-20 ENCOUNTER — Telehealth: Payer: Self-pay

## 2013-06-20 ENCOUNTER — Ambulatory Visit (INDEPENDENT_AMBULATORY_CARE_PROVIDER_SITE_OTHER): Payer: Self-pay | Admitting: Internal Medicine

## 2013-06-20 VITALS — BP 162/78 | HR 50 | Temp 98.0°F | Ht 65.0 in | Wt 145.2 lb

## 2013-06-20 DIAGNOSIS — R498 Other voice and resonance disorders: Secondary | ICD-10-CM

## 2013-06-20 DIAGNOSIS — L500 Allergic urticaria: Secondary | ICD-10-CM

## 2013-06-20 MED ORDER — METHADONE HCL 10 MG PO TABS: 30.0000 mg | ORAL_TABLET | Freq: Four times a day (QID) | ORAL | Status: DC

## 2013-06-20 MED ORDER — FLUCONAZOLE 150 MG PO TABS: 150.0000 mg | ORAL_TABLET | Freq: Every day | ORAL | Status: DC

## 2013-06-20 NOTE — Telephone Encounter (Signed)
Message copied by Shelly Coss on Fri Jun 20, 2013 10:53 AM ------      Message from: Adella Hare E      Created: Thu Jun 19, 2013 11:59 AM       Ok to prepare and mail Rx for methadone - 3 scripts.            Thanks ------

## 2013-06-20 NOTE — Patient Instructions (Addendum)
Stop Q var for next week or so at least  Script for diflucan 150  Take your acid blocker medicine as directed.
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
## 2013-06-20 NOTE — Telephone Encounter (Signed)
Printed, signed, and mailed

## 2013-06-23 ENCOUNTER — Ambulatory Visit: Payer: Self-pay | Admitting: Internal Medicine

## 2013-06-26 ENCOUNTER — Other Ambulatory Visit: Payer: Self-pay | Admitting: Internal Medicine

## 2013-06-26 MED ORDER — NYSTATIN 100000 UNIT/ML MT SUSP: 5.0000 mL | Freq: Four times a day (QID) | OROMUCOSAL | Status: DC

## 2013-06-26 NOTE — Telephone Encounter (Signed)
Sent new Rx to [facility].

## 2013-07-07 ENCOUNTER — Other Ambulatory Visit (INDEPENDENT_AMBULATORY_CARE_PROVIDER_SITE_OTHER): Payer: 59

## 2013-07-07 ENCOUNTER — Telehealth: Payer: Self-pay | Admitting: Internal Medicine

## 2013-07-07 ENCOUNTER — Ambulatory Visit (INDEPENDENT_AMBULATORY_CARE_PROVIDER_SITE_OTHER): Payer: 59 | Admitting: Internal Medicine

## 2013-07-07 ENCOUNTER — Encounter: Payer: Self-pay | Admitting: Internal Medicine

## 2013-07-07 VITALS — BP 122/74 | HR 56 | Ht 65.0 in | Wt 151.4 lb

## 2013-07-07 DIAGNOSIS — R0989 Other specified symptoms and signs involving the circulatory and respiratory systems: Secondary | ICD-10-CM

## 2013-07-07 DIAGNOSIS — R05 Cough: Secondary | ICD-10-CM

## 2013-07-07 DIAGNOSIS — R053 Chronic cough: Secondary | ICD-10-CM

## 2013-07-07 DIAGNOSIS — R0600 Dyspnea, unspecified: Secondary | ICD-10-CM

## 2013-07-07 DIAGNOSIS — R0609 Other forms of dyspnea: Secondary | ICD-10-CM

## 2013-07-07 DIAGNOSIS — R059 Cough, unspecified: Secondary | ICD-10-CM

## 2013-07-07 LAB — CBC WITH DIFFERENTIAL/PLATELET
Basophils Absolute: 0 10*3/uL (ref 0.0–0.1)
Basophils Relative: 0.5 % (ref 0.0–3.0)
EOS PCT: 2.4 % (ref 0.0–5.0)
Eosinophils Absolute: 0.2 10*3/uL (ref 0.0–0.7)
HEMATOCRIT: 34.8 % — AB (ref 36.0–46.0)
HEMOGLOBIN: 11.5 g/dL — AB (ref 12.0–15.0)
LYMPHS ABS: 1.8 10*3/uL (ref 0.7–4.0)
Lymphocytes Relative: 28.6 % (ref 12.0–46.0)
MCHC: 33.1 g/dL (ref 30.0–36.0)
MCV: 87.9 fl (ref 78.0–100.0)
MONOS PCT: 9.4 % (ref 3.0–12.0)
Monocytes Absolute: 0.6 10*3/uL (ref 0.1–1.0)
NEUTROS ABS: 3.7 10*3/uL (ref 1.4–7.7)
Neutrophils Relative %: 59.1 % (ref 43.0–77.0)
Platelets: 270 10*3/uL (ref 150.0–400.0)
RBC: 3.96 Mil/uL (ref 3.87–5.11)
RDW: 13.4 % (ref 11.5–14.6)
WBC: 6.2 10*3/uL (ref 4.5–10.5)

## 2013-07-07 LAB — BASIC METABOLIC PANEL
BUN: 20 mg/dL (ref 6–23)
CO2: 31 mEq/L (ref 19–32)
Calcium: 9.4 mg/dL (ref 8.4–10.5)
Chloride: 100 mEq/L (ref 96–112)
Creatinine, Ser: 1.2 mg/dL (ref 0.4–1.2)
GFR: 50.89 mL/min — AB (ref >60.00)
GLUCOSE: 95 mg/dL (ref 70–99)
POTASSIUM: 5 meq/L (ref 3.5–5.1)
SODIUM: 141 meq/L (ref 135–145)

## 2013-07-07 LAB — BRAIN NATRIURETIC PEPTIDE: Pro B Natriuretic peptide (BNP): 209 pg/mL — ABNORMAL HIGH (ref 0.0–100.0)

## 2013-07-07 MED ORDER — LEVALBUTEROL HCL 0.63 MG/3ML IN NEBU
0.6300 mg | INHALATION_SOLUTION | Freq: Once | RESPIRATORY_TRACT | Status: AC
  Administered 2013-07-07: 0.63 mg via RESPIRATORY_TRACT

## 2013-07-07 NOTE — Telephone Encounter (Signed)
Spoke with pt. Still reports severe SOB and chest heaviness. States, "I feel like someone is sitting on my chest, I can't get a deep breath." This has been going on for 2 weeks. Has GI appointment in the AM to see if GERD is playing a role in her symptoms. Would like to know if CY has any further recommendations.  Allergies  Allergen Reactions  . Influenza Vaccine Live     Pt has had Guillain-Barre syndrome  . Acetaminophen Other (See Comments)    Walks into walls if takes too much  . Citrus     Bladder infections, citric acid also causes   . Divalproex Sodium     migraine  . Influenza Virus Vacc Split Pf   . Phenazopyridine Hcl Nausea And Vomiting    pyridium  . Sulfonamide Derivatives Nausea And Vomiting  . Vesicare [Solifenacin] Other (See Comments)    Cause severe migraine headaches   Current Outpatient Prescriptions on File Prior to Visit  Medication Sig Dispense Refill  . beclomethasone (QVAR) 40 MCG/ACT inhaler Inhale 2 puffs into the lungs 2 (two) times daily as needed (asthma). Rinse mouth      . benzonatate (TESSALON) 100 MG capsule Take 200 mg by mouth 3 (three) times daily as needed for cough.      . Calcium Carbonate-Vitamin D (CALCIUM + D PO) Take 1 tablet by mouth daily.        . diazepam (VALIUM) 5 MG tablet Take 5 mg by mouth every 12 (twelve) hours as needed for anxiety.      . furosemide (LASIX) 20 MG tablet Take 20 mg by mouth every morning.      . gabapentin (NEURONTIN) 100 MG capsule Take 1 capsule (100 mg total) by mouth 3 (three) times daily.  90 capsule  3  . gemfibrozil (LOPID) 600 MG tablet Take 600 mg by mouth 2 (two) times daily before a meal.      . hydroxypropyl methylcellulose (ISOPTO TEARS) 2.5 % ophthalmic solution Place 1 drop into both eyes as needed for dry eyes.      Marland Kitchen lactulose (CHRONULAC) 10 GM/15ML solution Take 30 cc BID in addition to Miralax  1800 mL  3  . levothyroxine (SYNTHROID, LEVOTHROID) 50 MCG tablet Take 1 tablet (50 mcg total) by  mouth daily before breakfast.  100 tablet  5  . magnesium hydroxide (MILK OF MAGNESIA) 800 MG/5ML suspension Take 5 mLs by mouth daily as needed for constipation.       . methadone (DOLOPHINE) 10 MG tablet Take 3 tablets (30 mg total) by mouth every 6 (six) hours. Takes at 61, 6, 12, 6-FILL ON OR AFTER 09/02/2013  360 tablet  0  . metoprolol tartrate (LOPRESSOR) 25 MG tablet Take 1 tablet (25 mg total) by mouth 2 (two) times daily.  60 tablet  5  . Multiple Vitamins-Minerals (CENTRUM PO) Take 1 tablet by mouth daily.        Marland Kitchen nystatin (MYCOSTATIN) 100000 UNIT/ML suspension Take 5 mLs (500,000 Units total) by mouth 4 (four) times daily. Swish and swallow  150 mL  0  . ondansetron (ZOFRAN-ODT) 4 MG disintegrating tablet Take 4 mg by mouth every 6 (six) hours as needed for nausea or vomiting.      . polyethylene glycol (MIRALAX / GLYCOLAX) packet Take 17 g by mouth daily.      . Probiotic Product (ALIGN) 4 MG CAPS Take 1 capsule by mouth daily.        Marland Kitchen  promethazine (PHENERGAN) 12.5 MG tablet Take 12.5 mg by mouth every 6 (six) hours as needed for nausea or vomiting.      . SUMAtriptan (IMITREX) 100 MG tablet Take 100 mg by mouth every 2 (two) hours as needed for migraine or headache. May repeat in 2 hours if headache persists or recurs.      Marland Kitchen zolmitriptan (ZOMIG) 5 MG tablet Take 5 mg by mouth as needed for migraine.       No current facility-administered medications on file prior to visit.    CY - please advise. Thanks.

## 2013-07-07 NOTE — Telephone Encounter (Signed)
CXR the day before I saw her was clear. Suggest outpatient labs to check some possibilities:   BMET, CBC w diff, D-dimer, BNatPeptide      Dx dyspnea

## 2013-07-07 NOTE — Telephone Encounter (Signed)
Spoke with pt and advised of Dr Janee Morn recommendations.  Pt will come to lab this afternoon to have this drawn.

## 2013-07-08 ENCOUNTER — Ambulatory Visit (INDEPENDENT_AMBULATORY_CARE_PROVIDER_SITE_OTHER): Payer: 59 | Admitting: Internal Medicine

## 2013-07-08 ENCOUNTER — Encounter: Payer: Self-pay | Admitting: Internal Medicine

## 2013-07-08 ENCOUNTER — Telehealth: Payer: Self-pay | Admitting: Internal Medicine

## 2013-07-08 VITALS — BP 160/80 | HR 56 | Ht 65.0 in | Wt 148.0 lb

## 2013-07-08 DIAGNOSIS — K219 Gastro-esophageal reflux disease without esophagitis: Secondary | ICD-10-CM

## 2013-07-08 DIAGNOSIS — K5989 Other specified functional intestinal disorders: Secondary | ICD-10-CM

## 2013-07-08 DIAGNOSIS — K598 Other specified functional intestinal disorders: Secondary | ICD-10-CM

## 2013-07-08 DIAGNOSIS — R490 Dysphonia: Secondary | ICD-10-CM

## 2013-07-08 LAB — D-DIMER, QUANTITATIVE: D-Dimer, Quant: 0.46 ug/mL-FEU (ref 0.00–0.48)

## 2013-07-08 MED ORDER — PANTOPRAZOLE SODIUM 40 MG PO TBEC: 40.0000 mg | DELAYED_RELEASE_TABLET | Freq: Two times a day (BID) | ORAL | Status: DC

## 2013-07-08 NOTE — Telephone Encounter (Signed)
Pt is coming tomorrow for an endo upstairs.  She wants to pick up methadone around 1:00 before going upstairs.  The methadone was last filled 06-10-13.
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
## 2013-07-08 NOTE — Patient Instructions (Signed)
You have been scheduled for an endoscopy with propofol. Please follow written instructions given to you at your visit today. If you use inhalers (even only as needed), please bring them with you on the day of your procedure. Your physician has requested that you go to www.startemmi.com and enter the access code given to you at your visit today. This web site gives a general overview about your procedure. However, you should still follow specific instructions given to you by our office regarding your preparation for the procedure.  We have sent the following medications to your pharmacy for you to pick up at your convenience: Pantoprazole 40 mg twice daily  Diet for Gastroesophageal Reflux Disease, Adult Reflux (acid reflux) is when acid from your stomach flows up into the esophagus. When acid comes in contact with the esophagus, the acid causes irritation and soreness (inflammation) in the esophagus. When reflux happens often or so severely that it causes damage to the esophagus, it is called gastroesophageal reflux disease (GERD). Nutrition therapy can help ease the discomfort of GERD. FOODS OR DRINKS TO AVOID OR LIMIT  Smoking or chewing tobacco. Nicotine is one of the most potent stimulants to acid production in the gastrointestinal tract.  Caffeinated and decaffeinated coffee and black tea.  Regular or low-calorie carbonated beverages or energy drinks (caffeine-free carbonated beverages are allowed).   Strong spices, such as black pepper, white pepper, red pepper, cayenne, curry powder, and chili powder.  Peppermint or spearmint.  Chocolate.  High-fat foods, including meats and fried foods. Extra added fats including oils, butter, salad dressings, and nuts. Limit these to less than 8 tsp per day.  Fruits and vegetables if they are not tolerated, such as citrus fruits or tomatoes.  Alcohol.  Any food that seems to aggravate your condition. If you have questions regarding your diet,  call your caregiver or a registered dietitian. OTHER THINGS THAT MAY HELP GERD INCLUDE:   Eating your meals slowly, in a relaxed setting.  Eating 5 to 6 small meals per day instead of 3 large meals.  Eliminating food for a period of time if it causes distress.  Not lying down until 3 hours after eating a meal.  Keeping the head of your bed raised 6 to 9 inches (15 to 23 cm) by using a foam wedge or blocks under the legs of the bed. Lying flat may make symptoms worse.  Being physically active. Weight loss may be helpful in reducing reflux in overweight or obese adults.  Wear loose fitting clothing EXAMPLE MEAL PLAN This meal plan is approximately 2,000 calories based on [email or web address] meal planning guidelines. Breakfast   cup cooked oatmeal.  1 cup strawberries.  1 cup low-fat milk.  1 oz almonds. Snack  1 cup cucumber slices.  6 oz yogurt (made from low-fat or fat-free milk). Lunch  2 slice whole-wheat bread.  2 oz sliced [place].  2 tsp mayonnaise.  1 cup blueberries.  1 cup snap peas. Snack  6 whole-wheat crackers.  1 oz string cheese. Dinner   cup brown rice.  1 cup mixed veggies.  1 tsp olive oil.  3 oz grilled fish. Document Released: 04/10/2005 Document Revised: 07/03/2011 Document Reviewed: 02/24/2011 [facility] Patient Information 2014 [place], [place].   CC:Dr Adella Hare

## 2013-07-08 NOTE — Telephone Encounter (Signed)
Ok to prepare methadone Rx

## 2013-07-09 ENCOUNTER — Other Ambulatory Visit: Payer: Self-pay | Admitting: Internal Medicine

## 2013-07-09 ENCOUNTER — Ambulatory Visit (AMBULATORY_SURGERY_CENTER): Payer: 59 | Admitting: Internal Medicine

## 2013-07-09 ENCOUNTER — Ambulatory Visit: Payer: Self-pay | Admitting: Gynecology

## 2013-07-09 ENCOUNTER — Encounter: Payer: Self-pay | Admitting: Internal Medicine

## 2013-07-09 VITALS — BP 135/67 | HR 47 | Temp 98.8°F | Resp 25 | Ht 65.0 in | Wt 148.0 lb

## 2013-07-09 DIAGNOSIS — R109 Unspecified abdominal pain: Secondary | ICD-10-CM

## 2013-07-09 DIAGNOSIS — K2940 Chronic atrophic gastritis without bleeding: Secondary | ICD-10-CM

## 2013-07-09 DIAGNOSIS — R490 Dysphonia: Secondary | ICD-10-CM

## 2013-07-09 MED ORDER — SODIUM CHLORIDE 0.9 % IV SOLN: 500.0000 mL | INTRAVENOUS | Status: DC

## 2013-07-09 MED ORDER — SUCRALFATE 1 GM/10ML PO SUSP: 1.0000 g | Freq: Four times a day (QID) | ORAL | Status: DC

## 2013-07-09 NOTE — Progress Notes (Signed)
Lidocaine-40mg IV prior to Propofol InductionPropofol given over incremental dosages 

## 2013-07-09 NOTE — Telephone Encounter (Signed)
Hmmmmmm!!  No new scripts. Thanks for checking

## 2013-07-09 NOTE — Progress Notes (Signed)
Called to room to assist during endoscopic procedure.  Patient ID and intended procedure confirmed with present staff. Received instructions for my participation in the procedure from the performing physician.  

## 2013-07-09 NOTE — Progress Notes (Addendum)
Pt states she is having some abdominal cramping, rating as a "5" t/o time in the RR.  Warm tea given to pt to drink.  Pt's abdomen soft, easily palpable   Pt states this happened the last time she had an EGD and she states "I know for me, walking helps.  Last time I walked at home and the pain was relieved.  I just really want to get into my own house and try to pass this air."  I encouraged her to ambulate and drink warm fluids and call if needed

## 2013-07-09 NOTE — Op Note (Signed)
[facility]  [address] [place], [address]   ENDOSCOPY PROCEDURE REPORT  PATIENT: [name], [name]  MR#: [id number] BIRTHDATE: [birth year] , 62  yrs. old GENDER: Female ENDOSCOPIST: Lafayette Dragon, MD REFERRED BY:  Neena Rhymes, M.D. PROCEDURE DATE:  07/09/2013 PROCEDURE:  EGD w/ biopsy ASA CLASS:     Class II INDICATIONS:  hoarseness, cough, ccolonic and small bowel dysmotility on small bowel follow-through in December 2014, status post subtotal colectomy for colonic inertia, death and on dependent fibromyalgia. MEDICATIONS: MAC sedation, administered by CRNA and propofol (Diprivan) 200mg  IV TOPICAL ANESTHETIC: none  DESCRIPTION OF PROCEDURE: After the risks benefits and alternatives of the procedure were thoroughly explained, informed consent was obtained.  The LB [id number] endoscope was introduced through the mouth and advanced to the second portion of the duodenum. Without limitations.  The instrument was slowly withdrawn as the mucosa was fully examined.      Esophagus: Upper mid and distal esophagus appeared normal. There was no apparent reflux into the esophagus there was no stricture or esophagitis. The Z line was regular biopsies were taken from Z line to rule out esophagitis Stomach: There was small to moderate amount of bile retained in the stomach were patchy erosions in the gastric body along the greater curvature of the stomach which were biopsied. About 200 cc of bilious material was suctioned from the stomach. Gastric folds were normal. Pyloric other was unremarkable.mucosa in the antrum was erythematous Retroflexion of the endoscope revealed normal fundus and cardia[ duodenum: Duodenal bulb and descending was normal.        The scope was then withdrawn from the patient and the procedure completed.  COMPLICATIONS: There were no complications. ENDOSCOPIC IMPRESSION: bile gastritis. Bile retention Biopsies from gastric body  and GE junction Although the recent small bowel x-ray did not show any significant gastroesophageal reflux. Patient may have reflux of the bile as well as of acid because of gastric retention due to motility disorder. She will be encouraged to reduce her methadone dose. We will add Carafate 1 g twice a day and continue on the proton pump inhibitor twice a day  RECOMMENDATIONS: antireflux measures Await biopsy results Carafate 1 g by mouth twice a day Continue PPI Consider low-dose Reglan REPEAT EXAM:      no  eSigned:  Lafayette Dragon, MD 07/09/2013 2:55 PM   CC:  PATIENT NAME:  [name], [name] MR#: [id number]

## 2013-07-09 NOTE — Patient Instructions (Addendum)
YOU HAD AN ENDOSCOPIC PROCEDURE TODAY AT [facility]: Refer to the procedure report that was given to you for any specific questions about what was found during the examination.  If the procedure report does not answer your questions, please call your gastroenterologist to clarify.  If you requested that your care partner not be given the details of your procedure findings, then the procedure report has been included in a sealed envelope for you to review at your convenience later.  YOU SHOULD EXPECT: Some feelings of bloating in the abdomen. Passage of more gas than usual.  Walking can help get rid of the air that was put into your GI tract during the procedure and reduce the bloating  DIET: Your first meal following the procedure should be a light meal and then it is ok to progress to your normal diet.  A half-sandwich or bowl of soup is an example of a good first meal.  Heavy or fried foods are harder to digest and may make you feel nauseous or bloated.  Likewise meals heavy in dairy and vegetables can cause extra gas to form and this can also increase the bloating.  Drink plenty of fluids but you should avoid alcoholic beverages for 24 hours.  ACTIVITY: Your care partner should take you home directly after the procedure.  You should plan to take it easy, moving slowly for the rest of the day.  You can resume normal activity the day after the procedure however you should NOT DRIVE or use heavy machinery for 24 hours (because of the sedation medicines used during the test).    SYMPTOMS TO REPORT IMMEDIATELY: A gastroenterologist can be reached at any hour.  During normal business hours, 8:30 AM to 5:00 PM Monday through Friday, call [phone].  After hours and on weekends, please call the GI answering service at [phone] who will take a message and have the physician on call contact you.   Following upper endoscopy (EGD)  Vomiting of blood or coffee ground material  New  chest pain or pain under the shoulder blades  Painful or persistently difficult swallowing  New shortness of breath  Fever of 100F or higher  Black, tarry-looking stools  FOLLOW UP: If any biopsies were taken you will be contacted by phone or by letter within the next 1-3 weeks.  Call your gastroenterologist if you have not heard about the biopsies in 3 weeks.  Our staff will call the home number listed on your records the next business day following your procedure to check on you and address any questions or concerns that you may have at that time regarding the information given to you following your procedure. This is a courtesy call and so if there is no answer at the home number and we have not heard from you through the emergency physician on call, we will assume that you have returned to your regular daily activities without incident.  SIGNATURES/CONFIDENTIALITY: You and/or your care partner have signed paperwork which will be entered into your electronic medical record.  These signatures attest to the fact that that the information above on your After Visit Summary has been reviewed and is understood.  Full responsibility of the confidentiality of this discharge information lies with you and/or your care-partner.  Please read over antireflux regimen handout  Await biopsy results  Please continue your medications; your Carafate was sent to your pharmacy- take as directed per Dr. Olevia Perches

## 2013-07-09 NOTE — Telephone Encounter (Signed)
On 06/20/13 three scripts were printed and mailed to patient for March, April, and May all dated the 12 th.

## 2013-07-10 ENCOUNTER — Telehealth: Payer: Self-pay | Admitting: *Deleted

## 2013-07-10 NOTE — Telephone Encounter (Signed)
  Follow up Call-  Call back number 07/09/2013  Post procedure Call Back phone  # [phone]  Permission to leave phone message Yes     Patient questions:  Do you have a fever, pain , or abdominal swelling? no Pain Score  0 *  Have you tolerated food without any problems? yes  Have you been able to return to your normal activities? no  Do you have any questions about your discharge instructions: Diet   no Medications  no Follow up visit  no  Do you have questions or concerns about your Care? yes  Actions: * If pain score is 4 or above: No action needed, pain <4. Pt asks, "did they put a tube in my nose?  Since my procedure, my right nostril has been burning and I cannot stop sneezing."  I told her that we did place a nasal cannula into her nose and that was all.  I stated that the oxygen might have dried her nose out and to call back if this does not resolve.  Understanding voiced

## 2013-07-10 NOTE — Telephone Encounter (Signed)
Pt is aware.  Has not opened her mail in a while, so she will look for them.

## 2013-07-10 NOTE — Telephone Encounter (Signed)
LMOM for pt to return call. 

## 2013-07-13 NOTE — Assessment & Plan Note (Signed)
No recent recurrence. 

## 2013-07-13 NOTE — Assessment & Plan Note (Signed)
Discussed possibility of reflux. Not clear that Qvar is helping. To subtract any possibility of yeast laryngitis we will give Diflucan and stop Qvar

## 2013-07-15 ENCOUNTER — Encounter: Payer: Self-pay | Admitting: Internal Medicine

## 2013-07-16 ENCOUNTER — Encounter: Payer: Self-pay | Admitting: *Deleted

## 2013-07-21 ENCOUNTER — Encounter: Payer: Self-pay | Admitting: Internal Medicine

## 2013-07-21 NOTE — Patient Instructions (Signed)
Neb Xopenex
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
## 2013-07-21 NOTE — Assessment & Plan Note (Signed)
Manage current episode is acute exacerbation of bronchitis with a stress/anxiety component Plan-nebulizer Xopenex, labs

## 2013-07-31 ENCOUNTER — Telehealth: Payer: Self-pay | Admitting: Internal Medicine

## 2013-07-31 NOTE — Telephone Encounter (Signed)
Yes, she can take the pill and crush it.

## 2013-08-01 MED ORDER — SUCRALFATE 1 G PO TABS: 1.0000 g | ORAL_TABLET | Freq: Three times a day (TID) | ORAL | Status: DC

## 2013-08-01 NOTE — Telephone Encounter (Signed)
I have sent an rx to patient's pharmacy for the tablet form of carafate. I advised her that she can crush the pill and make into slurry. She verbalizes understanding.

## 2013-08-02 NOTE — Progress Notes (Signed)
   Subjective:    Patient ID: [name], female    DOB: 05/07/1950, 62 y.o.   MRN: [id number]  HPI Cancelled - no charge    Review of Systems     Objective:   Physical Exam        Assessment & Plan:

## 2013-08-13 ENCOUNTER — Ambulatory Visit (INDEPENDENT_AMBULATORY_CARE_PROVIDER_SITE_OTHER): Payer: 59 | Admitting: Gynecology

## 2013-08-13 ENCOUNTER — Encounter: Payer: Self-pay | Admitting: Gynecology

## 2013-08-13 VITALS — BP 132/74 | HR 72 | Temp 99.1°F | Ht 64.25 in | Wt 144.0 lb

## 2013-08-13 DIAGNOSIS — Z0000 Encounter for general adult medical examination without abnormal findings: Secondary | ICD-10-CM

## 2013-08-13 DIAGNOSIS — Z01419 Encounter for gynecological examination (general) (routine) without abnormal findings: Secondary | ICD-10-CM

## 2013-08-13 DIAGNOSIS — R300 Dysuria: Secondary | ICD-10-CM

## 2013-08-13 LAB — POCT URINALYSIS DIPSTICK
Bilirubin, UA: NEGATIVE
Glucose, UA: NEGATIVE
KETONES UA: NEGATIVE
Nitrite, UA: POSITIVE
RBC UA: NEGATIVE
Urobilinogen, UA: NEGATIVE
pH, UA: 5

## 2013-08-13 MED ORDER — NITROFURANTOIN MONOHYD MACRO 100 MG PO CAPS: 100.0000 mg | ORAL_CAPSULE | Freq: Two times a day (BID) | ORAL | Status: DC
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
## 2013-08-14 ENCOUNTER — Other Ambulatory Visit: Payer: Self-pay | Admitting: *Deleted

## 2013-08-14 MED ORDER — BENZONATATE 100 MG PO CAPS: 200.0000 mg | ORAL_CAPSULE | Freq: Three times a day (TID) | ORAL | Status: DC | PRN

## 2013-09-17 ENCOUNTER — Telehealth: Payer: Self-pay | Admitting: Internal Medicine

## 2013-09-17 NOTE — Telephone Encounter (Signed)
   I'm sorry; I have never prescribed methadone in over 30 years of practice. Perhaps there is some one in the office who his experience with this.

## 2013-09-17 NOTE — Telephone Encounter (Signed)
Patient called back to ask about rx.  I asked if the patient if she would be willing to make an ov to be seen by a physician tomorrow am to be prescribed the medication she wants, however, she declined. She stated she did not want to come for any type of appointment.

## 2013-09-17 NOTE — Telephone Encounter (Signed)
Previous patient of Dr. Linda Hedges last seen 2.25.2015. Patient will soon needs refill on her methadone (DOLOPHINE) 10 MG tablet rx. She is wanting to wait until Dr. Doug Sou arrives to re-establish with new PCP. She understands that it will be difficult to find a PCP who will continue to prescribe her pain medication and wants to know if a referral can be sent to pain management for this type of therapy. She says that Dr. Linda Hedges was supposed to refer her at last OV but she was never contacted.  Patient is willing to schedule an appointment with our office for pain management referral if necessary. Can patient have referral for pain management without another OV?

## 2013-09-17 NOTE — Telephone Encounter (Signed)
Patient Information:  Caller Name: Jaleeyah  Phone: [phone]  Patient: [name]  Gender: Female  DOB: 1951-03-13  Age: 63 Years  PCP: Webb Silversmith  Office Follow Up:  Does the office need to follow up with this patient?: Yes  Instructions For The Office: Please f/u with pt concerning refill on migraine medication, thank you.   Symptoms  Reason For Call & Symptoms: Pt reports she has mirgaine and has requested the pharmacy to request refill on Zomig 5 mg.  Reviewed Health History In EMR: Yes  Reviewed Medications In EMR: Yes  Reviewed Allergies In EMR: Yes  Reviewed Surgeries / Procedures: Yes  Date of Onset of Symptoms: 09/17/2013  Guideline(s) Used:  No Protocol Available - Information Only  Disposition Per Guideline:   Discuss with PCP and Callback by Nurse Today  Reason For Disposition Reached:   Nursing judgment  Advice Given:  Call Back If:  New symptoms develop  You become worse.  Patient Will Follow Care Advice:  YES

## 2013-09-18 ENCOUNTER — Ambulatory Visit: Payer: 59 | Admitting: Internal Medicine

## 2013-09-18 ENCOUNTER — Telehealth: Payer: Self-pay | Admitting: Internal Medicine

## 2013-09-18 ENCOUNTER — Other Ambulatory Visit: Payer: Self-pay | Admitting: *Deleted

## 2013-09-18 MED ORDER — ZOLMITRIPTAN 5 MG PO TABS: 5.0000 mg | ORAL_TABLET | ORAL | Status: DC | PRN

## 2013-09-18 NOTE — Telephone Encounter (Signed)
She needs to be seen.

## 2013-09-18 NOTE — Telephone Encounter (Signed)
Appt today with Dr. Jenny Reichmann for the migraine.

## 2013-09-18 NOTE — Telephone Encounter (Signed)
Pt has had a migraine for 3 days.  She has called the pharmacy for refills on Imitrex or zomeg.  No one has called her back.   She also wants a referral to a pain clinic.

## 2013-09-22 ENCOUNTER — Ambulatory Visit (INDEPENDENT_AMBULATORY_CARE_PROVIDER_SITE_OTHER): Payer: 59 | Admitting: Internal Medicine

## 2013-09-22 ENCOUNTER — Encounter: Payer: Self-pay | Admitting: Internal Medicine

## 2013-09-22 VITALS — BP 140/62 | HR 56 | Temp 98.5°F | Wt 136.8 lb

## 2013-09-22 DIAGNOSIS — IMO0001 Reserved for inherently not codable concepts without codable children: Secondary | ICD-10-CM

## 2013-09-22 DIAGNOSIS — G43909 Migraine, unspecified, not intractable, without status migrainosus: Secondary | ICD-10-CM

## 2013-09-22 DIAGNOSIS — F1190 Opioid use, unspecified, uncomplicated: Secondary | ICD-10-CM

## 2013-09-22 DIAGNOSIS — F1120 Opioid dependence, uncomplicated: Secondary | ICD-10-CM

## 2013-09-22 MED ORDER — METHADONE HCL 10 MG PO TABS: 30.0000 mg | ORAL_TABLET | Freq: Four times a day (QID) | ORAL | Status: DC

## 2013-09-22 MED ORDER — SUMATRIPTAN SUCCINATE 100 MG PO TABS: ORAL_TABLET | ORAL | Status: DC

## 2013-09-22 NOTE — Assessment & Plan Note (Signed)
Medication refill until admitted to Chronic Pain Clinic

## 2013-09-22 NOTE — Progress Notes (Signed)
Pre visit review using our clinic review tool, if applicable. No additional management support is needed unless otherwise documented below in the visit note. 

## 2013-09-22 NOTE — Patient Instructions (Signed)
The Pain Clinic referral will be scheduled and you'll be notified of the time.

## 2013-09-22 NOTE — Progress Notes (Signed)
   Subjective:    Patient ID: [name], female    DOB: 1950/05/31, 63 y.o.   MRN: [id number]  HPI  She has been treated for severe fibromyalgia by Dr. Linda Hedges prior to his retirement with a methadone regimen.  The fibromyalgia began in 1995 or 1996 according to the patient. OxyContin was most helpful but she cannot afford this prescription. Phenergan and codeine in combination were of marginal benefit. She's presently on methadone.  She's also had physical therapy, chiropractory, acupuncture, massage, and injections with variable response  She was followed in the pain clinic at [facility] until 6 years ago when it closed  She also has severe bowel dysfunction. She only has 4 inches of colon removed remaining. Because of intractable constipation in the setting of an excessively long, redundant colon,she had 2 bowel surgeries.  "5 feet " & subsequently "1 foot " of bowel was removed.  She had a colonoscopy November 2014. In March of this year she had an upper endoscopy which revealed bile gastritis.She's also had motility studies by Dr. Olevia Perches.   Review of Systems  She describes daily loose to watery stools despite taking probiotics. She also takes MiraLax for "bowel management" despite the loosestool.  This is the fifth day of a migraine headache. Zomig called in and was of no benefit. She's requesting a refill of her Imitrex.  This is associated with aura of visual spots for 5 hours prior to the migraine.  On April 12 she did have vomiting and bloody stools. This was not treated & resolved w/o recurrence.  Her recent office visit with Dr. Annamaria Boots & labs performed by him were reviewed and discussed with her. Her GFR is mildly decreased.       Objective:   Physical Exam   She appears adequately nourished in no acute distress.  Eyes: No conjunctival inflammation or scleral icterus is present.  Oral exam: Dental hygiene is good; lips and gums are healthy appearing.There is no  oropharyngeal erythema or exudate noted. Tongue is moist.  Heart:  Normal rate and regular rhythm. S1 and S2 normal without gallop, murmur, click, rub or other extra sounds    Lungs:Chest clear to auscultation; no wheezes, rhonchi,rales ,or rubs present.No increased work of breathing.   Abdomen:  soft and non-tender without masses, organomegaly or hernias noted. There is dullness to percussion right upper quadrant without definitive organomegaly. Bowel sounds are active.   No guarding or rebound   Skin:Warm & dry.  Intact without suspicious lesions or rashes ; no jaundice .There is minimal tenting present.   Lymphatic: No lymphadenopathy is noted about the head, neck, axilla          Assessment & Plan:  #1 chronic pain in context of fibromyalgia & multiple bowel surgeries Plan: Chronic Pain Clinic referral

## 2013-10-07 ENCOUNTER — Encounter: Payer: Self-pay | Admitting: Internal Medicine

## 2013-10-07 ENCOUNTER — Ambulatory Visit (INDEPENDENT_AMBULATORY_CARE_PROVIDER_SITE_OTHER): Payer: 59 | Admitting: Internal Medicine

## 2013-10-07 VITALS — BP 130/84 | HR 68 | Temp 98.6°F | Ht 65.0 in | Wt 136.5 lb

## 2013-10-07 DIAGNOSIS — R197 Diarrhea, unspecified: Secondary | ICD-10-CM | POA: Insufficient documentation

## 2013-10-07 DIAGNOSIS — R109 Unspecified abdominal pain: Secondary | ICD-10-CM

## 2013-10-07 MED ORDER — METRONIDAZOLE 250 MG PO TABS: 250.0000 mg | ORAL_TABLET | Freq: Three times a day (TID) | ORAL | Status: DC

## 2013-10-07 NOTE — Progress Notes (Signed)
Pre visit review using our clinic review tool, if applicable. No additional management support is needed unless otherwise documented below in the visit note. 

## 2013-10-07 NOTE — Patient Instructions (Signed)
Please take all new medication as prescribed  Please continue all other medications as before, and refills have been done if requested.  Please have the pharmacy call with any other refills you may need.  Please continue your efforts at being more active, low cholesterol diet, and weight control.  Please call if not starting to be better in 2-3 days, as you will likely need to see GI  Please go to the LAB in the Basement (turn left off the elevator) for the tests to be done tomorrow  You will be contacted by phone if any changes need to be made immediately.  Otherwise, you will receive a letter about your results with an explanation, but please check with MyChart first.
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
## 2013-10-08 ENCOUNTER — Ambulatory Visit (INDEPENDENT_AMBULATORY_CARE_PROVIDER_SITE_OTHER): Payer: 59

## 2013-10-08 DIAGNOSIS — R197 Diarrhea, unspecified: Secondary | ICD-10-CM

## 2013-10-08 DIAGNOSIS — R109 Unspecified abdominal pain: Secondary | ICD-10-CM

## 2013-10-08 LAB — BASIC METABOLIC PANEL
BUN: 22 mg/dL (ref 6–23)
CHLORIDE: 98 meq/L (ref 96–112)
CO2: 28 meq/L (ref 19–32)
Calcium: 9.4 mg/dL (ref 8.4–10.5)
Creatinine, Ser: 1.2 mg/dL (ref 0.4–1.2)
GFR: 49.84 mL/min — ABNORMAL LOW (ref >60.00)
Glucose, Bld: 103 mg/dL — ABNORMAL HIGH (ref 70–99)
POTASSIUM: 4 meq/L (ref 3.5–5.1)
Sodium: 136 mEq/L (ref 135–145)

## 2013-10-08 LAB — HEPATIC FUNCTION PANEL
ALT: 17 U/L (ref 0–35)
AST: 31 U/L (ref 0–37)
Albumin: 4.3 g/dL (ref 3.5–5.2)
Alkaline Phosphatase: 75 U/L (ref 39–117)
BILIRUBIN TOTAL: 0.4 mg/dL (ref 0.2–1.2)
Bilirubin, Direct: 0.1 mg/dL (ref 0.0–0.3)
TOTAL PROTEIN: 7.3 g/dL (ref 6.0–8.3)

## 2013-10-08 LAB — CBC WITH DIFFERENTIAL/PLATELET
Basophils Absolute: 0 10*3/uL (ref 0.0–0.1)
Basophils Relative: 0.4 % (ref 0.0–3.0)
EOS ABS: 0.1 10*3/uL (ref 0.0–0.7)
EOS PCT: 1.7 % (ref 0.0–5.0)
HCT: 34.9 % — ABNORMAL LOW (ref 36.0–46.0)
Hemoglobin: 11.2 g/dL — ABNORMAL LOW (ref 12.0–15.0)
Lymphocytes Relative: 37.2 % (ref 12.0–46.0)
Lymphs Abs: 2.1 10*3/uL (ref 0.7–4.0)
MCHC: 32.2 g/dL (ref 30.0–36.0)
MCV: 87.3 fl (ref 78.0–100.0)
MONO ABS: 0.5 10*3/uL (ref 0.1–1.0)
Monocytes Relative: 8.5 % (ref 3.0–12.0)
NEUTROS PCT: 52.2 % (ref 43.0–77.0)
Neutro Abs: 2.9 10*3/uL (ref 1.4–7.7)
PLATELETS: 255 10*3/uL (ref 150.0–400.0)
RBC: 4 Mil/uL (ref 3.87–5.11)
RDW: 14 % (ref 11.5–15.5)
WBC: 5.6 10*3/uL (ref 4.0–10.5)

## 2013-10-08 LAB — LIPASE: LIPASE: 31 U/L (ref 11.0–59.0)

## 2013-10-09 ENCOUNTER — Other Ambulatory Visit: Payer: 59

## 2013-10-09 ENCOUNTER — Other Ambulatory Visit: Payer: Self-pay | Admitting: Internal Medicine

## 2013-10-09 DIAGNOSIS — R197 Diarrhea, unspecified: Secondary | ICD-10-CM

## 2013-10-09 DIAGNOSIS — R109 Unspecified abdominal pain: Secondary | ICD-10-CM

## 2013-10-09 LAB — URINALYSIS, ROUTINE W REFLEX MICROSCOPIC
BILIRUBIN URINE: NEGATIVE
HGB URINE DIPSTICK: NEGATIVE
Ketones, ur: NEGATIVE
Nitrite: NEGATIVE
RBC / HPF: NONE SEEN (ref 0–?)
Specific Gravity, Urine: 1.025 (ref 1.000–1.030)
TOTAL PROTEIN, URINE-UPE24: NEGATIVE
UROBILINOGEN UA: 0.2 (ref 0.0–1.0)
Urine Glucose: NEGATIVE
pH: 6 (ref 5.0–8.0)

## 2013-10-10 ENCOUNTER — Telehealth: Payer: Self-pay | Admitting: *Deleted

## 2013-10-10 LAB — C. DIFFICILE GDH AND TOXIN A/B
C. DIFFICILE GDH: NOT DETECTED
C. difficile Toxin A/B: NOT DETECTED

## 2013-10-10 NOTE — Telephone Encounter (Signed)
Patient informed of MD instructions. 

## 2013-10-10 NOTE — Telephone Encounter (Signed)
Pt left msg on triage stating she can notlog in to her computer to check her results. Called pt back gave her md response concerning labs. She ? If she had C-diff inform pt C-diff results was negative. Pt states her sxs is not any better, she is weak. She has taking antibiotic that md rx. She states she feel like she is dehydrated and think she needs fluid. Advise pt to go to the ER, she stated that she can't go right now. Wanting to know what else md recommend her to do...Johny Chess

## 2013-10-10 NOTE — Telephone Encounter (Signed)
unfort I dont have anything else but drink as much fluids as possible, or ER if dizzy, weak, fall, or worsning pain, fever, chills

## 2013-10-12 NOTE — Assessment & Plan Note (Signed)
Chronic intermittent in past, for labs,  to f/u any worsening symptoms or concerns

## 2013-10-12 NOTE — Assessment & Plan Note (Signed)
Possible acute c diff by hx, for flagyl asd, c diff toxin, stool studies, culture, f/u 2-3 days

## 2013-10-13 ENCOUNTER — Telehealth: Payer: Self-pay | Admitting: Internal Medicine

## 2013-10-13 LAB — OVA AND PARASITE EXAMINATION: OP: NONE SEEN

## 2013-10-13 LAB — STOOL CULTURE

## 2013-10-13 NOTE — Telephone Encounter (Signed)
Patient states she has had diarrhea and nausea x 5 weeks. She saw her PCP and c. Diff was negative. States everything she eats goes straight through. She is on a low dose antibiotic for possible bacterial colitis but is not any better. Scheduled patient with Dr. Olevia Perches on 10/14/13 at 9:30 AM.

## 2013-10-14 ENCOUNTER — Other Ambulatory Visit (INDEPENDENT_AMBULATORY_CARE_PROVIDER_SITE_OTHER): Payer: 59

## 2013-10-14 ENCOUNTER — Ambulatory Visit (INDEPENDENT_AMBULATORY_CARE_PROVIDER_SITE_OTHER): Payer: 59 | Admitting: Internal Medicine

## 2013-10-14 ENCOUNTER — Encounter: Payer: Self-pay | Admitting: Internal Medicine

## 2013-10-14 VITALS — BP 118/70 | HR 72 | Ht 65.0 in | Wt 139.2 lb

## 2013-10-14 DIAGNOSIS — K598 Other specified functional intestinal disorders: Secondary | ICD-10-CM

## 2013-10-14 DIAGNOSIS — R197 Diarrhea, unspecified: Secondary | ICD-10-CM

## 2013-10-14 DIAGNOSIS — K5989 Other specified functional intestinal disorders: Secondary | ICD-10-CM

## 2013-10-14 LAB — SEDIMENTATION RATE: Sed Rate: 28 mm/hr — ABNORMAL HIGH (ref 0–22)

## 2013-10-14 MED ORDER — DIPHENOXYLATE-ATROPINE 2.5-0.025 MG PO TABS: 1.0000 | ORAL_TABLET | Freq: Two times a day (BID) | ORAL | Status: DC
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
## 2013-10-14 NOTE — Patient Instructions (Signed)
We have sent the following medications to your pharmacy for you to pick up at your convenience: Lomotil twice daily  Your physician has requested that you go to the basement for the following lab work before leaving today: Celiac Panel, Sed Rate  CC:Dr Cathlean Cower

## 2013-10-15 LAB — CELIAC PANEL 10
ENDOMYSIAL SCREEN: NEGATIVE
Gliadin IgA: 9.8 U/mL (ref <20)
Gliadin IgG: 9 U/mL (ref <20)
IGA: 206 mg/dL (ref 69–380)
Tissue Transglut Ab: 14.7 U/mL (ref <20)
Tissue Transglutaminase Ab, IgA: 4.2 U/mL (ref <20)

## 2013-10-24 ENCOUNTER — Other Ambulatory Visit: Payer: Self-pay | Admitting: Internal Medicine

## 2013-10-27 NOTE — Telephone Encounter (Signed)
Message copied by Larina Bras on Mon Oct 27, 2013 10:10 AM ------      Message from: Lafayette Dragon      Created: Mon Oct 27, 2013 10:07 AM       Ok TO REFILL  zOFRAN ? 4 MG, #30, 1 PO Q 8 HRS PRN NAUSEA, 1 REFILL.      ----- Message -----         From: Larina Bras, CMA         Sent: 10/27/2013   8:48 AM           To: Lafayette Dragon, MD            Patient requests refills of zofran. Last rx was from 2013. Do you want me to refill?       ------

## 2013-11-19 ENCOUNTER — Telehealth: Payer: Self-pay | Admitting: *Deleted

## 2013-11-19 DIAGNOSIS — G894 Chronic pain syndrome: Secondary | ICD-10-CM

## 2013-11-19 NOTE — Telephone Encounter (Signed)
Left msg on triage stating Dr. Linna Darner referred pt to pain clinic back in June still haven't received an appt yet. She will be out of her methadone on Sunday. Pt is wanting md to give her refill...[name]

## 2013-11-19 NOTE — Telephone Encounter (Signed)
Unfortunately I no longer prescribe methadone as well, nor other controlled meds such as schedule 2 or higher;l I can re-do referral to pain clinic and/or methadone clinic

## 2013-11-19 NOTE — Telephone Encounter (Signed)
If referral was declined @ one Clinic; another should have been contacted .That referral was made June 1,fifty eight days ago. As per [place] Board of Medicine guidelines;Methadone requires special designation to be Rxed. If can't get her into another pain clinic;she needs to see another MD in this practice. I do not Rx Methadone.I did this once until pain clinic appt could be made.

## 2013-11-20 MED ORDER — METHADONE HCL 10 MG PO TABS: 30.0000 mg | ORAL_TABLET | Freq: Four times a day (QID) | ORAL | Status: DC

## 2013-11-20 NOTE — Telephone Encounter (Signed)
Printed prescription as MD instructed. Called the patient informed to pickup hardcopy at the front desk.

## 2013-11-20 NOTE — Telephone Encounter (Signed)
I can Rx Methadone up to date of first appointment.  Kudoes to [place] ; incredible job!

## 2013-11-20 NOTE — Telephone Encounter (Signed)
Spoke w/Cindy @ [facility]. She states they only prescribe methadone for drug addiction, not for pain management. Patient would have to call stating she is seeking methadone for addiction and go to their office 7 days a week b/c they do not give out prescriptions. I have spoken w/pt who states she will not do that b/c she is not addicted to anything.   I also called Dr. Talbert Forest Corrington at Cindy's recommendation. He will prescribe suboxone, but he is on vacation until Monday and they are at their limit for suboxone patients.   I also called [facility]. They will also prescribe suboxone, but will not be able to see the patient until next week at the earliest. I am unable to find anyone in the area who will prescribe methadone for pain. I spoke w/pt again regarding this. She states she does not necessarily need to stay on methadone and is open to other pain management options. She started taking it b/c it was inexpensive and she did not have health insurance at the time.   Called [place] @ [facility] and they have a provider who can possibly see her next week. They are requesting patient's records so they can be reviewed and approved by Dr. Vira Blanco. Records have been faxed and marked as urgent. Awaiting decision/notification from [facility].  Patient also mentioned reducing her methadone dosage to make it last a few more days. I advised patient that she should not change the dosage any of her medications without first consulting with a doctor.

## 2013-11-20 NOTE — Telephone Encounter (Signed)
[facility] does not accept pts insurance. Patient requested to be referred to Dr. Hardin Negus @ [facility]. I spoke w/Tiffany (new patient coordinator) and she states she can try to expedite referral, but first available appointment would be in October.

## 2013-12-17 ENCOUNTER — Other Ambulatory Visit: Payer: Self-pay | Admitting: Internal Medicine

## 2013-12-17 ENCOUNTER — Other Ambulatory Visit: Payer: Self-pay

## 2013-12-17 MED ORDER — GEMFIBROZIL 600 MG PO TABS: 600.0000 mg | ORAL_TABLET | Freq: Two times a day (BID) | ORAL | Status: DC

## 2013-12-17 MED ORDER — PROMETHAZINE HCL 12.5 MG PO TABS: 12.5000 mg | ORAL_TABLET | Freq: Four times a day (QID) | ORAL | Status: AC | PRN

## 2013-12-22 ENCOUNTER — Telehealth: Payer: Self-pay | Admitting: Internal Medicine

## 2013-12-22 NOTE — Telephone Encounter (Signed)
[name], I'll let Clair Gulling address this 9/1. I filled her Methadone once.

## 2013-12-22 NOTE — Telephone Encounter (Signed)
Patient Information:  Caller Name: [name]  Phone: [phone]  Patient: [name]  Gender: Female  DOB: 23-May-1950  Age: 63 Years  PCP: Webb Silversmith  Office Follow Up:  Does the office need to follow up with this patient?: Yes  Instructions For The Office: Please review 2 patient requests. Patient can be reached at  [phone].   Symptoms  Reason For Call & Symptoms: (1)  Is running out of Methadone tomorrow am, takes 30mg  QID/Q6Hours 03/30/11/6.  Wanting Rx written so she can pick it up in am 9/1 to carry it to pharmacy.  Does not have appointment in new pain clinic until late September and Dr Linna Darner has agreed to write Rx until then. (2)  Started with burning with urination 6 days ago.  Has been taking AZO and pushing fluids but is not helping.   Has frequency, urination Q1Hour.   Noting spasms after urniating that last for some time.  Says impossible to get to office today due to diarrhea. Asking for order so she can come into office in am to leave urine specimen - declines OV appointment - just wanting order for specimen so Rx can be called to pharmacy later in day sfter urine is checked.  Reviewed Health History In EMR: Yes  Reviewed Medications In EMR: Yes  Reviewed Allergies In EMR: Yes  Reviewed Surgeries / Procedures: Yes  Date of Onset of Symptoms: 12/16/2013  Treatments Tried: AZo helps some  Treatments Tried Worked: No  Guideline(s) Used:  Urination Pain - Female  Disposition Per Guideline:   See Today in Office  Reason For Disposition Reached:   Painful urination AND EITHER frequency or urgency  Advice Given:  N/A  Patient Refused Recommendation:  Patient Requests Prescription  Wanting order for urinalysis to be done in am 9/1 without office visit so order for antibioic can be called to [facility] later in the day.

## 2013-12-23 MED ORDER — CEPHALEXIN 500 MG PO CAPS: 500.0000 mg | ORAL_CAPSULE | Freq: Four times a day (QID) | ORAL | Status: DC

## 2013-12-23 MED ORDER — METHADONE HCL 10 MG PO TABS: 30.0000 mg | ORAL_TABLET | Freq: Four times a day (QID) | ORAL | Status: DC

## 2013-12-23 NOTE — Telephone Encounter (Signed)
[place] for one month further refill, Done hardcopy to [facility]; has pain clinic appt late sept 2015  [place] this time only for cephalexin asd - done erx

## 2013-12-23 NOTE — Telephone Encounter (Signed)
Called the patient informed hardcopy is ready for pickup and antibiotic is at [facility].  Did inform of MD instrucions.

## 2013-12-30 ENCOUNTER — Other Ambulatory Visit: Payer: Self-pay | Admitting: Internal Medicine

## 2013-12-30 MED ORDER — MAGIC MOUTHWASH: 5.0000 mL | Freq: Three times a day (TID) | ORAL | Status: DC

## 2013-12-31 ENCOUNTER — Other Ambulatory Visit: Payer: 59

## 2013-12-31 ENCOUNTER — Ambulatory Visit (INDEPENDENT_AMBULATORY_CARE_PROVIDER_SITE_OTHER): Payer: 59 | Admitting: Internal Medicine

## 2013-12-31 ENCOUNTER — Encounter: Payer: Self-pay | Admitting: Internal Medicine

## 2013-12-31 VITALS — BP 122/74 | HR 63 | Temp 99.0°F | Resp 18 | Ht 65.0 in | Wt 141.1 lb

## 2013-12-31 DIAGNOSIS — R252 Cramp and spasm: Secondary | ICD-10-CM

## 2013-12-31 DIAGNOSIS — R498 Other voice and resonance disorders: Secondary | ICD-10-CM

## 2013-12-31 DIAGNOSIS — N390 Urinary tract infection, site not specified: Secondary | ICD-10-CM | POA: Insufficient documentation

## 2013-12-31 DIAGNOSIS — N3000 Acute cystitis without hematuria: Secondary | ICD-10-CM

## 2013-12-31 DIAGNOSIS — R300 Dysuria: Secondary | ICD-10-CM

## 2013-12-31 DIAGNOSIS — E039 Hypothyroidism, unspecified: Secondary | ICD-10-CM

## 2013-12-31 DIAGNOSIS — R0789 Other chest pain: Secondary | ICD-10-CM

## 2013-12-31 DIAGNOSIS — K589 Irritable bowel syndrome without diarrhea: Secondary | ICD-10-CM

## 2013-12-31 LAB — POCT URINALYSIS DIPSTICK
Bilirubin, UA: NEGATIVE
GLUCOSE UA: NEGATIVE
Ketones, UA: NEGATIVE
NITRITE UA: NEGATIVE
Protein, UA: NEGATIVE
RBC UA: NEGATIVE
SPEC GRAV UA: 1.025
Urobilinogen, UA: 4
pH, UA: 6

## 2013-12-31 MED ORDER — GEMFIBROZIL 600 MG PO TABS: 600.0000 mg | ORAL_TABLET | Freq: Two times a day (BID) | ORAL | Status: AC

## 2013-12-31 MED ORDER — LEVOTHYROXINE SODIUM 50 MCG PO TABS: 50.0000 ug | ORAL_TABLET | Freq: Every day | ORAL | Status: AC

## 2013-12-31 MED ORDER — METOPROLOL TARTRATE 25 MG PO TABS: 25.0000 mg | ORAL_TABLET | Freq: Two times a day (BID) | ORAL | Status: AC

## 2013-12-31 MED ORDER — NITROFURANTOIN MONOHYD MACRO 100 MG PO CAPS: 100.0000 mg | ORAL_CAPSULE | Freq: Two times a day (BID) | ORAL | Status: DC

## 2013-12-31 NOTE — Assessment & Plan Note (Signed)
She has not had recently.

## 2013-12-31 NOTE — Assessment & Plan Note (Signed)
She is stable with this problem with the gabapentin.

## 2013-12-31 NOTE — Progress Notes (Signed)
   Subjective:    Patient ID: [name], female    DOB: Jan 31, 1951, 63 y.o.   MRN: [id number]  HPI The patient is a 63 YO female with PMH including fibromyalgia, chronic pain, IBS, hyperlipidemia, high blood pressure without hypertension, hypothyroidism. She has been treated for UTI recently and was only able to tolerate about 2 days of her keflex before she started having GI upset and vomiting. She is trying to get into a pain clinic currently and would like to switch her methadone back to something else since she now has insurance again. She does not have migraines often. She does have intermittent problems with her bowels and does not always know when she will need to have diarrhea. She does have a GI doctor which has helped her to keep it a little better under control. She denies chest pains at the moment and SOB. She is having some hoarseness right now and thinks it may be related to allergies.   Review of Systems  Constitutional: Negative for fever, chills, activity change and appetite change.  HENT: Positive for voice change.   Respiratory: Negative for chest tightness, shortness of breath and wheezing.   Cardiovascular: Negative for chest pain, palpitations and leg swelling.  Gastrointestinal: Positive for abdominal pain, diarrhea and constipation.  Musculoskeletal: Positive for myalgias.  Allergic/Immunologic: Positive for environmental allergies.  Neurological: Negative for dizziness, syncope, weakness, light-headedness, numbness and headaches.       Objective:   Physical Exam  Constitutional: She is oriented to person, place, and time. She appears well-developed and well-nourished.  HENT:  Head: Normocephalic and atraumatic.  Eyes: EOM are normal.  Neck: Normal range of motion. No JVD present.  Cardiovascular: Normal rate and regular rhythm.   Pulmonary/Chest: Effort normal and breath sounds normal. No respiratory distress. She has no wheezes. She has no rales. She exhibits  no tenderness.  Abdominal: Soft. Bowel sounds are normal. She exhibits distension. There is tenderness. There is no rebound and no guarding.  Neurological: She is alert and oriented to person, place, and time. Coordination normal.  Skin: Skin is warm and dry.   Filed Vitals:   12/31/13 1056  BP: 122/74  Pulse: 63  Temp: 99 F (37.2 C)  TempSrc: Oral  Resp: 18  Height: 5\' 5"  (1.651 m)  Weight: 141 lb 1.9 oz (64.012 kg)  SpO2: 96%       Assessment & Plan:

## 2013-12-31 NOTE — Assessment & Plan Note (Signed)
Continue synthroid and recheck TSH at next visit.

## 2013-12-31 NOTE — Patient Instructions (Signed)
We are going to send in macrobid (nitrofurantoin) for your urinary tract infection. Take 1 pill two times per day for 7 days to get rid of the infection. If you have any problems with it please call us.   We will have you come back in about 3-6 months. If you are unable to get into a pain clinic come back in about 3 months otherwise you can wait 6 months.   Your blood pressure is well controlled today and we will not change your medicines. We will check blood work at your next visit.   Urinary Tract Infection Urinary tract infections (UTIs) can develop anywhere along your urinary tract. Your urinary tract is your body's drainage system for removing wastes and extra water. Your urinary tract includes two kidneys, two ureters, a bladder, and a urethra. Your kidneys are a pair of bean-shaped organs. Each kidney is about the size of your fist. They are located below your ribs, one on each side of your spine. CAUSES Infections are caused by microbes, which are microscopic organisms, including fungi, viruses, and bacteria. These organisms are so small that they can only be seen through a microscope. Bacteria are the microbes that most commonly cause UTIs. SYMPTOMS  Symptoms of UTIs may vary by age and gender of the patient and by the location of the infection. Symptoms in young women typically include a frequent and intense urge to urinate and a painful, burning feeling in the bladder or urethra during urination. Older women and men are more likely to be tired, shaky, and weak and have muscle aches and abdominal pain. A fever may mean the infection is in your kidneys. Other symptoms of a kidney infection include pain in your back or sides below the ribs, nausea, and vomiting. DIAGNOSIS To diagnose a UTI, your caregiver will ask you about your symptoms. Your caregiver also will ask to provide a urine sample. The urine sample will be tested for bacteria and white blood cells. White blood cells are made by your  body to help fight infection. TREATMENT  Typically, UTIs can be treated with medication. Because most UTIs are caused by a bacterial infection, they usually can be treated with the use of antibiotics. The choice of antibiotic and length of treatment depend on your symptoms and the type of bacteria causing your infection. HOME CARE INSTRUCTIONS  If you were prescribed antibiotics, take them exactly as your caregiver instructs you. Finish the medication even if you feel better after you have only taken some of the medication.  Drink enough water and fluids to keep your urine clear or pale yellow.  Avoid caffeine, tea, and carbonated beverages. They tend to irritate your bladder.  Empty your bladder often. Avoid holding urine for long periods of time.  Empty your bladder before and after sexual intercourse.  After a bowel movement, women should cleanse from front to back. Use each tissue only once. SEEK MEDICAL CARE IF:   You have back pain.  You develop a fever.  Your symptoms do not begin to resolve within 3 days. SEEK IMMEDIATE MEDICAL CARE IF:   You have severe back pain or lower abdominal pain.  You develop chills.  You have nausea or vomiting.  You have continued burning or discomfort with urination. MAKE SURE YOU:   Understand these instructions.  Will watch your condition.  Will get help right away if you are not doing well or get worse. Document Released: 01/18/2005 Document Revised: 10/10/2011 Document Reviewed: 05/19/2011 ExitCare Patient  Information 2015 [employer]. This information is not intended to replace advice given to you by your health care provider. Make sure you discuss any questions you have with your health care provider.  

## 2013-12-31 NOTE — Assessment & Plan Note (Signed)
Seems to be related to recent allergies and drainage. She will go back on her nasal corticosteroid which has helped in the past.

## 2013-12-31 NOTE — Assessment & Plan Note (Signed)
She has not done well with this over the time and lomotil seems to give her some relief when she needs to go out. She continues to follow with Dr. Olevia Perches.

## 2013-12-31 NOTE — Assessment & Plan Note (Signed)
Patient was unable to finish course of keflex so will call in macrobid for her. Advised her to take whole course.

## 2013-12-31 NOTE — Progress Notes (Signed)
Pre visit review using our clinic review tool, if applicable. No additional management support is needed unless otherwise documented below in the visit note. 

## 2014-01-01 LAB — URINE CULTURE
COLONY COUNT: NO GROWTH
ORGANISM ID, BACTERIA: NO GROWTH

## 2014-01-13 ENCOUNTER — Other Ambulatory Visit: Payer: Self-pay | Admitting: Internal Medicine

## 2014-01-15 ENCOUNTER — Telehealth: Payer: Self-pay | Admitting: *Deleted

## 2014-01-15 MED ORDER — SUCRALFATE 1 GM/10ML PO SUSP: 1.0000 g | Freq: Four times a day (QID) | ORAL | Status: DC

## 2014-01-15 NOTE — Telephone Encounter (Signed)
Per [facility], patient has requested carafate suspension in place of tablets. I advised that tablets were covered by insurance, suspension is NOT. Advised I am glad to give a refill but will not attempt prior authorization again.  They state "Mrs. [name] i okay with suspension price, we simplyl need refills authenticated, thanks." Rx sent.

## 2014-01-16 ENCOUNTER — Other Ambulatory Visit: Payer: Self-pay | Admitting: Internal Medicine

## 2014-01-22 ENCOUNTER — Telehealth: Payer: Self-pay | Admitting: Internal Medicine

## 2014-01-22 MED ORDER — METHADONE HCL 10 MG PO TABS: 30.0000 mg | ORAL_TABLET | Freq: Four times a day (QID) | ORAL | Status: DC

## 2014-01-22 NOTE — Telephone Encounter (Signed)
Is this ok to refill?  

## 2014-01-22 NOTE — Telephone Encounter (Signed)
Will refill until she gets into pain clinic. Can we have her do urine drug screen when she picks up script?

## 2014-01-22 NOTE — Telephone Encounter (Signed)
Patient is requesting script for methadone.  States Dr. Linda Hedges use to prescribe.  She has enough for one more day.

## 2014-01-23 ENCOUNTER — Other Ambulatory Visit: Payer: Self-pay | Admitting: Geriatric Medicine

## 2014-01-23 DIAGNOSIS — G894 Chronic pain syndrome: Secondary | ICD-10-CM

## 2014-01-23 MED ORDER — METHADONE HCL 10 MG PO TABS: 30.0000 mg | ORAL_TABLET | Freq: Four times a day (QID) | ORAL | Status: DC

## 2014-01-23 NOTE — Telephone Encounter (Signed)
Patient aware and will come pick up the prescription.

## 2014-01-30 ENCOUNTER — Other Ambulatory Visit: Payer: Self-pay | Admitting: Internal Medicine

## 2014-02-03 ENCOUNTER — Encounter: Payer: Self-pay | Admitting: Internal Medicine

## 2014-02-03 ENCOUNTER — Telehealth: Payer: Self-pay | Admitting: Internal Medicine

## 2014-02-03 ENCOUNTER — Ambulatory Visit (INDEPENDENT_AMBULATORY_CARE_PROVIDER_SITE_OTHER): Payer: 59 | Admitting: Internal Medicine

## 2014-02-03 ENCOUNTER — Other Ambulatory Visit (INDEPENDENT_AMBULATORY_CARE_PROVIDER_SITE_OTHER): Payer: 59

## 2014-02-03 VITALS — BP 110/72 | HR 98 | Temp 98.3°F | Resp 13 | Wt 139.2 lb

## 2014-02-03 DIAGNOSIS — R5383 Other fatigue: Secondary | ICD-10-CM

## 2014-02-03 DIAGNOSIS — R4689 Other symptoms and signs involving appearance and behavior: Secondary | ICD-10-CM | POA: Insufficient documentation

## 2014-02-03 DIAGNOSIS — G43411 Hemiplegic migraine, intractable, with status migrainosus: Secondary | ICD-10-CM

## 2014-02-03 DIAGNOSIS — M797 Fibromyalgia: Secondary | ICD-10-CM

## 2014-02-03 DIAGNOSIS — R5382 Chronic fatigue, unspecified: Secondary | ICD-10-CM

## 2014-02-03 LAB — CBC WITH DIFFERENTIAL/PLATELET
BASOS ABS: 0 10*3/uL (ref 0.0–0.1)
Basophils Relative: 0.6 % (ref 0.0–3.0)
Eosinophils Absolute: 0.1 10*3/uL (ref 0.0–0.7)
Eosinophils Relative: 1.5 % (ref 0.0–5.0)
HCT: 38 % (ref 36.0–46.0)
Hemoglobin: 12.2 g/dL (ref 12.0–15.0)
Lymphocytes Relative: 31.5 % (ref 12.0–46.0)
Lymphs Abs: 2 10*3/uL (ref 0.7–4.0)
MCHC: 32 g/dL (ref 30.0–36.0)
MCV: 87 fl (ref 78.0–100.0)
MONO ABS: 0.5 10*3/uL (ref 0.1–1.0)
Monocytes Relative: 7.6 % (ref 3.0–12.0)
NEUTROS PCT: 58.8 % (ref 43.0–77.0)
Neutro Abs: 3.8 10*3/uL (ref 1.4–7.7)
PLATELETS: 260 10*3/uL (ref 150.0–400.0)
RBC: 4.37 Mil/uL (ref 3.87–5.11)
RDW: 13.8 % (ref 11.5–15.5)
WBC: 6.5 10*3/uL (ref 4.0–10.5)

## 2014-02-03 LAB — HEPATIC FUNCTION PANEL
ALT: 12 U/L (ref 0–35)
AST: 22 U/L (ref 0–37)
Albumin: 4 g/dL (ref 3.5–5.2)
Alkaline Phosphatase: 105 U/L (ref 39–117)
BILIRUBIN TOTAL: 0.5 mg/dL (ref 0.2–1.2)
Bilirubin, Direct: 0 mg/dL (ref 0.0–0.3)
TOTAL PROTEIN: 8.4 g/dL — AB (ref 6.0–8.3)

## 2014-02-03 LAB — BASIC METABOLIC PANEL
BUN: 20 mg/dL (ref 6–23)
CALCIUM: 9.8 mg/dL (ref 8.4–10.5)
CO2: 26 meq/L (ref 19–32)
CREATININE: 1 mg/dL (ref 0.4–1.2)
Chloride: 104 mEq/L (ref 96–112)
GFR: 58.33 mL/min — ABNORMAL LOW (ref >60.00)
Glucose, Bld: 90 mg/dL (ref 70–99)
Potassium: 4.8 mEq/L (ref 3.5–5.1)
Sodium: 138 mEq/L (ref 135–145)

## 2014-02-03 LAB — MAGNESIUM: Magnesium: 2.2 mg/dL (ref 1.5–2.5)

## 2014-02-03 LAB — TSH: TSH: 1.46 u[IU]/mL (ref 0.35–4.50)

## 2014-02-03 LAB — SEDIMENTATION RATE: Sed Rate: 23 mm/hr — ABNORMAL HIGH (ref 0–22)

## 2014-02-03 MED ORDER — TOPIRAMATE 25 MG PO CPSP: 25.0000 mg | ORAL_CAPSULE | Freq: Two times a day (BID) | ORAL | Status: DC

## 2014-02-03 NOTE — Patient Instructions (Addendum)
Chronic daily headaches represent  a conversion of migraines into a headache which recurs repeatedly every day .These are almost always  due to the use of excess nonsteroidals such as ibuprofen or naproxen and or caffeine. The nonsteroidals and caffeine should be weaned as quickly as possible; but they should not be "cold [place]" going from a high dose to none.  Generic Topamax as1 pill  @ bedtime week one  then 1 twice beginning week 2
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
## 2014-02-03 NOTE — Progress Notes (Signed)
Pre visit review using our clinic review tool, if applicable. No additional management support is needed unless otherwise documented below in the visit note. 

## 2014-02-03 NOTE — Telephone Encounter (Signed)
Spoke with patient and she states she has had nausea for 10 days. She has taken Phenergan without relief. Patient has not tried Zofran. Suggested she try alternating Zofran and Phenergan to see if she can break the cycle of nausea. She will call back if this does not help.

## 2014-02-11 ENCOUNTER — Encounter: Payer: Self-pay | Admitting: Internal Medicine

## 2014-02-23 ENCOUNTER — Encounter: Payer: Self-pay | Admitting: Internal Medicine

## 2014-02-26 ENCOUNTER — Telehealth: Payer: Self-pay | Admitting: Internal Medicine

## 2014-02-26 NOTE — Telephone Encounter (Signed)
Patient is requesting script for methadone.  Patient is leaving out on a flight Monday.  She is requesting script before then.

## 2014-02-27 MED ORDER — METHADONE HCL 10 MG PO TABS: 30.0000 mg | ORAL_TABLET | Freq: Four times a day (QID) | ORAL | Status: DC

## 2014-02-27 NOTE — Telephone Encounter (Signed)
Please call her and let her know it is ready.

## 2014-02-27 NOTE — Telephone Encounter (Signed)
Called pt no answer LMOM rx ready for pick-up.../lmb 

## 2014-04-02 ENCOUNTER — Telehealth: Payer: Self-pay | Admitting: *Deleted

## 2014-04-02 MED ORDER — METHADONE HCL 10 MG PO TABS: 30.0000 mg | ORAL_TABLET | Freq: Four times a day (QID) | ORAL | Status: DC

## 2014-04-02 NOTE — Telephone Encounter (Signed)
Left msg on triage needing refill for methadone...Johny Chess

## 2014-04-02 NOTE — Telephone Encounter (Signed)
Printed and signed.  

## 2014-04-03 NOTE — Telephone Encounter (Signed)
Called pt no answer LMOM rx ready for pick-up.../lmb 

## 2014-04-30 ENCOUNTER — Telehealth: Payer: Self-pay | Admitting: Gynecology

## 2014-04-30 NOTE — Telephone Encounter (Signed)
Spoke with patient. Patient states "I went through menopause a while ago but 3 years ago I began to have really bad hot flashes. I am hot all the time. I have my air on all year round and I am still hot. I am willing to try anything. I do take medicine for my thyroid but they told me I am at a good level. I just want to come in to see if there is anything else I can do." Requesting appointment on 1/14. Appointment scheduled for 1/14 at 11:15am with Milford Cage, [place]. Agreeable to date and time.  Routing to provider for final review. Patient agreeable to disposition. Will close encounter ;

## 2014-04-30 NOTE — Telephone Encounter (Signed)
Patient calling requesting an appointment for a consultation. She said, "I need some help with my hormones. My mom is visiting and I can't stay in the same room with her because I am too hot."

## 2014-05-04 ENCOUNTER — Telehealth: Payer: Self-pay | Admitting: Internal Medicine

## 2014-05-04 NOTE — Telephone Encounter (Signed)
Pt needs a methadone refill. Pt has not heard back from the pain clinic and has waited for months to be seen. [phone]

## 2014-05-05 MED ORDER — METHADONE HCL 10 MG PO TABS: 30.0000 mg | ORAL_TABLET | Freq: Four times a day (QID) | ORAL | Status: DC

## 2014-05-05 NOTE — Addendum Note (Signed)
Addended by: Vertell Novak A on: 05/05/2014 01:19 PM   Modules accepted: Orders

## 2014-05-05 NOTE — Telephone Encounter (Signed)
Please call and let her know it is ready.

## 2014-05-07 ENCOUNTER — Ambulatory Visit: Payer: Self-pay | Admitting: Nurse Practitioner

## 2014-05-14 ENCOUNTER — Ambulatory Visit (INDEPENDENT_AMBULATORY_CARE_PROVIDER_SITE_OTHER): Payer: 59 | Admitting: Nurse Practitioner

## 2014-05-14 ENCOUNTER — Encounter: Payer: Self-pay | Admitting: Nurse Practitioner

## 2014-05-14 VITALS — BP 130/74 | HR 88 | Ht 64.25 in | Wt 155.2 lb

## 2014-05-14 DIAGNOSIS — R232 Flushing: Secondary | ICD-10-CM

## 2014-05-14 NOTE — Progress Notes (Deleted)
63 y.o.DW Fe G2 P2 here with complaint of vaginal symptoms of itching, burning, and increase discharge. Describes discharge as ***. Onset of symptoms *** days ago. Denies new personal products or vaginal dryness. *** STD concerns. Urinary symptoms *** .   O:Healthy female WDWN Affect: normal, orientation x 3  Exam: Abdomen: Lymph node: no enlargement or tenderness Pelvic exam: External genital:  BUS: negative Vagina: *** discharge noted. Ph:   ,Wet prep taken Cervix: normal, non tender Uterus: normal, non tender Adnexa:normal, non tender, no masses or fullness noted   Wet Prep results:   A:   P:Discussed findings of *** and etiology. Discussed Aveeno or baking soda sitz bath for comfort. Avoid moist clothes or pads for extended period of time. If working out in gym clothes or swim suits for long periods of time change underwear or bottoms of swimsuit if possible. Olive Oil use for skin protection prior to activity can be used to external skin. Rx: ***  Rv prn

## 2014-05-14 NOTE — Progress Notes (Signed)
Patient ID: [name], female   DOB: 1950/08/08, 64 y.o.   MRN: [id number] S:   This 64 yo WD Fe G2P2 presents to discuss vaso symptoms.  She is  A former pt. of Dr. Charlies Constable.  States she has always had hot flashes but seem to be worse past 4 years.  Several years ago - about 4 years;  Dr. Linda Hedges has tried low dose of estrogen that she and took for 4 months.  Maybe ... slight help.  She feels the temperature regulation is mainly from the thyroid problems but wanted to see if HRT would be advisable again. No mammogram since 04/2006.  She has chronic medical issues related to back and multiple GI issues with removal of most of her colon with dietary restrictions.    She also has fibromyalgia and takes Methadone for pain.  Now that Dr. Linda Hedges is retired she is seeing a replacement but does not feel they are able to help her with a referral to a pain clinic.  Some  of this I believe to be related to no insurance.  Chronic pain is from fibromyalgia and back pain.  Recent EDG 3/15, colonoscopy was done 12/14.  She also has multiple family issues - daughter had a baby 8 days ago and will not allow her mother to come see the baby because she is a' drug user'.   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  She also is not current on Mammogram and needs to schedule.   She asked for other names of Internal Med's - given Dr. Minna Antis as this would be closer - insurance and lack of may be the biggest issue.   Declined request for other non hormonal treatment   Declined request for changing her thyroid med's - PCP is  following   Consult time: 20 minutes

## 2014-05-14 NOTE — Patient Instructions (Addendum)
Please contact Dr. Minna Antis at [facility] to establish care.

## 2014-05-17 NOTE — Progress Notes (Signed)
Agree with recommendations due to ongoing and additional medication issues.  Reviewed personally.  Felipa Emory, MD.

## 2014-05-19 NOTE — Addendum Note (Signed)
Addended by: Antonietta Barcelona on: 05/19/2014 08:53 AM   Modules accepted: Level of Service

## 2014-05-22 ENCOUNTER — Telehealth: Payer: Self-pay | Admitting: Internal Medicine

## 2014-05-22 NOTE — Telephone Encounter (Signed)
Patient called stating her pharmacy has been trying to contact us regarding a prior authorization for methadone. She states they have been faxing [place] since 05/05/14. Please advise patient on status.

## 2014-06-09 ENCOUNTER — Telehealth: Payer: Self-pay | Admitting: Internal Medicine

## 2014-06-09 ENCOUNTER — Other Ambulatory Visit: Payer: Self-pay | Admitting: Geriatric Medicine

## 2014-06-09 MED ORDER — METHADONE HCL 10 MG PO TABS: 30.0000 mg | ORAL_TABLET | Freq: Four times a day (QID) | ORAL | Status: DC

## 2014-06-09 NOTE — Telephone Encounter (Signed)
Patient requested to speak to you. She states that you have been assisting her with finding a pain clinic.

## 2014-06-09 NOTE — Telephone Encounter (Signed)
Printed and signed. Patient will come pick up.

## 2014-06-09 NOTE — Telephone Encounter (Signed)
Patient requesting refill for methadone (DOLOPHINE) 10 MG tablet [[id number]] . States will be out tomorrow.

## 2014-06-12 NOTE — Telephone Encounter (Signed)
Patient was not accepted into [facility]. She is aware we are still working on finding a pain management office who will see her.

## 2014-06-15 NOTE — Telephone Encounter (Signed)
[facility] states they will accept patient. Can you place new referral (previous one was entered in July 2015)?  Also, they require a letter of discharge stating you will no longer be prescribing the patient methadone.

## 2014-06-16 ENCOUNTER — Encounter: Payer: Self-pay | Admitting: Internal Medicine

## 2014-06-16 ENCOUNTER — Other Ambulatory Visit: Payer: Self-pay | Admitting: Internal Medicine

## 2014-06-16 DIAGNOSIS — G894 Chronic pain syndrome: Secondary | ICD-10-CM

## 2014-06-19 ENCOUNTER — Ambulatory Visit: Payer: Self-pay | Admitting: Internal Medicine

## 2014-07-08 NOTE — Telephone Encounter (Signed)
Patient need refill of Methadone 10 mg, will come and pick it up tomorrow

## 2014-07-09 ENCOUNTER — Other Ambulatory Visit: Payer: Self-pay | Admitting: Geriatric Medicine

## 2014-07-09 MED ORDER — METHADONE HCL 10 MG PO TABS: 30.0000 mg | ORAL_TABLET | Freq: Four times a day (QID) | ORAL | Status: DC

## 2014-07-09 NOTE — Telephone Encounter (Signed)
Dr. Doug Sou will refill once more until patient goes to the pain clinic at the end of this month.

## 2014-08-04 ENCOUNTER — Telehealth: Payer: Self-pay | Admitting: Internal Medicine

## 2014-08-04 NOTE — Telephone Encounter (Signed)
error 

## 2014-08-07 ENCOUNTER — Telehealth: Payer: Self-pay | Admitting: Internal Medicine

## 2014-08-07 ENCOUNTER — Other Ambulatory Visit: Payer: Self-pay | Admitting: Internal Medicine

## 2014-08-07 NOTE — Telephone Encounter (Signed)
Spoke with patient and informed her that Dr. Doug Sou will not refill the medication. The pain clinic set her up with the methadone clinic to go for 30 days and dose down. The patient has not started going because she says she is having foot surgery and she cannot dose down from methadone and have the surgery at the same time. I explained that I am sorry, but Dr. Doug Sou will not fill methadone anymore. She filled it for 7 months until the patient got set up with the pain clinic. She has not been seen here in out office in 6 months. I told her the best thing she could do was to schedule another office visit with Dr. Doug Sou.

## 2014-08-07 NOTE — Telephone Encounter (Signed)
Sent Rx for pantoprazole, 40 mg, #60 with 3 refills and carafate, 1 gm/10 mL suspension, #420 mL, with no refills to [facility] on 08/07/14.

## 2014-08-07 NOTE — Telephone Encounter (Signed)
Patient was discharged from pain management on 06/16/14 due to referral to pain clinic. Per [facility] she had a scheduled apt with pain management on 07/21/14 and should have gone to that. Unable to provide refill at this time. She is also overdue for follow up (not seen except once on 12/31/13). Encourage her to make or keep appointments with pain management.

## 2014-08-07 NOTE — Telephone Encounter (Signed)
Patient needs refill for methadone (DOLOPHINE) 10 MG tablet [[id number]. She says she came in and requested it on maybe Wednesday and I don't see it.

## 2014-08-13 ENCOUNTER — Encounter (HOSPITAL_COMMUNITY): Payer: Self-pay

## 2014-08-13 ENCOUNTER — Emergency Department (HOSPITAL_COMMUNITY)
Admission: EM | Admit: 2014-08-13 | Discharge: 2014-08-14 | Disposition: A | Discharge status: Home / Self Care | Payer: 59 | Attending: Emergency Medicine | Admitting: Emergency Medicine

## 2014-08-13 ENCOUNTER — Emergency Department (HOSPITAL_COMMUNITY): Payer: 59

## 2014-08-13 DIAGNOSIS — W19XXXA Unspecified fall, initial encounter: Secondary | ICD-10-CM

## 2014-08-13 DIAGNOSIS — J45909 Unspecified asthma, uncomplicated: Secondary | ICD-10-CM | POA: Insufficient documentation

## 2014-08-13 DIAGNOSIS — Z85828 Personal history of other malignant neoplasm of skin: Secondary | ICD-10-CM | POA: Insufficient documentation

## 2014-08-13 DIAGNOSIS — E039 Hypothyroidism, unspecified: Secondary | ICD-10-CM | POA: Diagnosis not present

## 2014-08-13 DIAGNOSIS — I10 Essential (primary) hypertension: Secondary | ICD-10-CM | POA: Insufficient documentation

## 2014-08-13 DIAGNOSIS — Z8739 Personal history of other diseases of the musculoskeletal system and connective tissue: Secondary | ICD-10-CM | POA: Diagnosis not present

## 2014-08-13 DIAGNOSIS — W01198A Fall on same level from slipping, tripping and stumbling with subsequent striking against other object, initial encounter: Secondary | ICD-10-CM | POA: Insufficient documentation

## 2014-08-13 DIAGNOSIS — Y9389 Activity, other specified: Secondary | ICD-10-CM | POA: Diagnosis not present

## 2014-08-13 DIAGNOSIS — G8929 Other chronic pain: Secondary | ICD-10-CM | POA: Diagnosis not present

## 2014-08-13 DIAGNOSIS — D649 Anemia, unspecified: Secondary | ICD-10-CM | POA: Insufficient documentation

## 2014-08-13 DIAGNOSIS — Z87828 Personal history of other (healed) physical injury and trauma: Secondary | ICD-10-CM | POA: Diagnosis not present

## 2014-08-13 DIAGNOSIS — G43909 Migraine, unspecified, not intractable, without status migrainosus: Secondary | ICD-10-CM | POA: Insufficient documentation

## 2014-08-13 DIAGNOSIS — S3992XA Unspecified injury of lower back, initial encounter: Secondary | ICD-10-CM | POA: Diagnosis not present

## 2014-08-13 DIAGNOSIS — Y998 Other external cause status: Secondary | ICD-10-CM | POA: Diagnosis not present

## 2014-08-13 DIAGNOSIS — R531 Weakness: Secondary | ICD-10-CM | POA: Diagnosis present

## 2014-08-13 DIAGNOSIS — Z79899 Other long term (current) drug therapy: Secondary | ICD-10-CM | POA: Diagnosis not present

## 2014-08-13 DIAGNOSIS — Z7951 Long term (current) use of inhaled steroids: Secondary | ICD-10-CM | POA: Insufficient documentation

## 2014-08-13 DIAGNOSIS — Y9289 Other specified places as the place of occurrence of the external cause: Secondary | ICD-10-CM | POA: Diagnosis not present

## 2014-08-13 DIAGNOSIS — Z8719 Personal history of other diseases of the digestive system: Secondary | ICD-10-CM | POA: Insufficient documentation

## 2014-08-13 LAB — CBC WITH DIFFERENTIAL/PLATELET
BASOS ABS: 0 10*3/uL (ref 0.0–0.1)
Basophils Relative: 0 % (ref 0–1)
Eosinophils Absolute: 0.2 10*3/uL (ref 0.0–0.7)
Eosinophils Relative: 2 % (ref 0–5)
HEMATOCRIT: 31.3 % — AB (ref 36.0–46.0)
HEMOGLOBIN: 9.9 g/dL — AB (ref 12.0–15.0)
Lymphocytes Relative: 35 % (ref 12–46)
Lymphs Abs: 2.7 10*3/uL (ref 0.7–4.0)
MCH: 27.7 pg (ref 26.0–34.0)
MCHC: 31.6 g/dL (ref 30.0–36.0)
MCV: 87.7 fL (ref 78.0–100.0)
Monocytes Absolute: 0.6 10*3/uL (ref 0.1–1.0)
Monocytes Relative: 7 % (ref 3–12)
Neutro Abs: 4.4 10*3/uL (ref 1.7–7.7)
Neutrophils Relative %: 56 % (ref 43–77)
Platelets: 237 10*3/uL (ref 150–400)
RBC: 3.57 MIL/uL — AB (ref 3.87–5.11)
RDW: 13.2 % (ref 11.5–15.5)
WBC: 7.8 10*3/uL (ref 4.0–10.5)

## 2014-08-13 LAB — COMPREHENSIVE METABOLIC PANEL
ALK PHOS: 77 U/L (ref 39–117)
ALT: 24 U/L (ref 0–35)
AST: 31 U/L (ref 0–37)
Albumin: 3.5 g/dL (ref 3.5–5.2)
Anion gap: 3 — ABNORMAL LOW (ref 5–15)
BUN: 16 mg/dL (ref 6–23)
CO2: 27 mmol/L (ref 19–32)
Calcium: 8.4 mg/dL (ref 8.4–10.5)
Chloride: 111 mmol/L (ref 96–112)
Creatinine, Ser: 0.96 mg/dL (ref 0.50–1.10)
GFR calc non Af Amer: 62 mL/min — ABNORMAL LOW (ref >90)
GFR, EST AFRICAN AMERICAN: 72 mL/min — AB (ref >90)
GLUCOSE: 94 mg/dL (ref 70–99)
Potassium: 3.3 mmol/L — ABNORMAL LOW (ref 3.5–5.1)
Sodium: 141 mmol/L (ref 135–145)
Total Bilirubin: <0.1 mg/dL — ABNORMAL LOW (ref 0.3–1.2)
Total Protein: 6.3 g/dL (ref 6.0–8.3)

## 2014-08-13 LAB — I-STAT CG4 LACTIC ACID, ED: Lactic Acid, Venous: 0.71 mmol/L (ref 0.5–2.0)

## 2014-08-13 LAB — LIPASE, BLOOD: Lipase: 17 U/L (ref 11–59)

## 2014-08-13 LAB — ETHANOL: Alcohol, Ethyl (B): <5 mg/dL (ref 0–9)

## 2014-08-13 MED ORDER — SODIUM CHLORIDE 0.9 % IV BOLUS (SEPSIS)
1000.0000 mL | Freq: Once | INTRAVENOUS | Status: AC
  Administered 2014-08-13: 1000 mL via INTRAVENOUS

## 2014-08-13 NOTE — ED Notes (Addendum)
Pt presents from home via EMS with c/o weakness. Pt reports to EMS that she fell approx one hour ago and hit her head, abrasion to her left temple. Pt c/o right flank pain as well. Pt reported to EMS that she has fibromyalgia and believes that may be a "brain fog" related to that. Negative for stroke symptoms per EMS. Pt reports she has been having this lightheadedness for three months and has not told her doctor. Pt given 4 zofran by EMS.

## 2014-08-13 NOTE — ED Notes (Signed)
MD at bedside. 

## 2014-08-13 NOTE — ED Notes (Signed)
Pt placed on 2L of O2 via [place] as she was dropping to 88-89% on RA while sleeping.

## 2014-08-13 NOTE — ED Notes (Signed)
Patient transported to CT 
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
## 2014-08-13 NOTE — ED Notes (Signed)
Patient calling her son for ride home.

## 2014-08-13 NOTE — ED Notes (Signed)
Patient transported to X-ray 

## 2014-08-13 NOTE — ED Notes (Signed)
Bed: JK93 Expected date: 08/13/14 Expected time: 8:33 PM Means of arrival: Ambulance Comments: Fall, weak

## 2014-08-13 NOTE — ED Notes (Signed)
Pts [name], and said to let pt know that she has picked up her Dog and was taking care of it until she was home. [phone].

## 2014-08-13 NOTE — Discharge Instructions (Signed)
As discussed, your evaluation today has been largely reassuring.  But, it is important that you monitor your condition carefully, and do not hesitate to return to the ED if you develop new, or concerning changes in your condition.  Otherwise, please follow-up with your physician for appropriate ongoing care.  Please be sure to discuss the anemia identified on today's blood work, as well as the circumstances about your fall.

## 2014-08-14 ENCOUNTER — Telehealth: Payer: Self-pay

## 2014-08-16 ENCOUNTER — Emergency Department (HOSPITAL_COMMUNITY)
Admission: EM | Admit: 2014-08-16 | Discharge: 2014-08-17 | Disposition: A | Discharge status: Home / Self Care | Payer: 59 | Attending: Emergency Medicine | Admitting: Emergency Medicine

## 2014-08-16 ENCOUNTER — Emergency Department (HOSPITAL_COMMUNITY): Payer: 59

## 2014-08-16 ENCOUNTER — Encounter (HOSPITAL_COMMUNITY): Payer: Self-pay | Admitting: Emergency Medicine

## 2014-08-16 DIAGNOSIS — E785 Hyperlipidemia, unspecified: Secondary | ICD-10-CM | POA: Diagnosis not present

## 2014-08-16 DIAGNOSIS — M1990 Unspecified osteoarthritis, unspecified site: Secondary | ICD-10-CM | POA: Insufficient documentation

## 2014-08-16 DIAGNOSIS — R531 Weakness: Secondary | ICD-10-CM | POA: Insufficient documentation

## 2014-08-16 DIAGNOSIS — I10 Essential (primary) hypertension: Secondary | ICD-10-CM | POA: Diagnosis not present

## 2014-08-16 DIAGNOSIS — Z7951 Long term (current) use of inhaled steroids: Secondary | ICD-10-CM | POA: Insufficient documentation

## 2014-08-16 DIAGNOSIS — Y998 Other external cause status: Secondary | ICD-10-CM | POA: Diagnosis not present

## 2014-08-16 DIAGNOSIS — W1839XA Other fall on same level, initial encounter: Secondary | ICD-10-CM | POA: Insufficient documentation

## 2014-08-16 DIAGNOSIS — Z79899 Other long term (current) drug therapy: Secondary | ICD-10-CM | POA: Diagnosis not present

## 2014-08-16 DIAGNOSIS — G43909 Migraine, unspecified, not intractable, without status migrainosus: Secondary | ICD-10-CM | POA: Diagnosis not present

## 2014-08-16 DIAGNOSIS — Y9289 Other specified places as the place of occurrence of the external cause: Secondary | ICD-10-CM | POA: Insufficient documentation

## 2014-08-16 DIAGNOSIS — M797 Fibromyalgia: Secondary | ICD-10-CM | POA: Diagnosis not present

## 2014-08-16 DIAGNOSIS — G8929 Other chronic pain: Secondary | ICD-10-CM | POA: Diagnosis not present

## 2014-08-16 DIAGNOSIS — Z043 Encounter for examination and observation following other accident: Secondary | ICD-10-CM | POA: Diagnosis not present

## 2014-08-16 DIAGNOSIS — Z85828 Personal history of other malignant neoplasm of skin: Secondary | ICD-10-CM | POA: Diagnosis not present

## 2014-08-16 DIAGNOSIS — Y9301 Activity, walking, marching and hiking: Secondary | ICD-10-CM | POA: Diagnosis not present

## 2014-08-16 DIAGNOSIS — J45909 Unspecified asthma, uncomplicated: Secondary | ICD-10-CM | POA: Diagnosis not present

## 2014-08-16 DIAGNOSIS — E039 Hypothyroidism, unspecified: Secondary | ICD-10-CM | POA: Insufficient documentation

## 2014-08-16 DIAGNOSIS — J209 Acute bronchitis, unspecified: Secondary | ICD-10-CM | POA: Insufficient documentation

## 2014-08-16 DIAGNOSIS — J40 Bronchitis, not specified as acute or chronic: Secondary | ICD-10-CM

## 2014-08-16 LAB — I-STAT CHEM 8, ED
BUN: 29 mg/dL — ABNORMAL HIGH (ref 6–23)
Calcium, Ion: 1.11 mmol/L — ABNORMAL LOW (ref 1.13–1.30)
Chloride: 105 mmol/L (ref 96–112)
Creatinine, Ser: 1.1 mg/dL (ref 0.50–1.10)
GLUCOSE: 84 mg/dL (ref 70–99)
HCT: 36 % (ref 36.0–46.0)
Hemoglobin: 12.2 g/dL (ref 12.0–15.0)
Potassium: 3.8 mmol/L (ref 3.5–5.1)
Sodium: 139 mmol/L (ref 135–145)
TCO2: 23 mmol/L (ref 0–100)

## 2014-08-16 LAB — CBC WITH DIFFERENTIAL/PLATELET
Basophils Absolute: 0 10*3/uL (ref 0.0–0.1)
Basophils Relative: 0 % (ref 0–1)
EOS PCT: 1 % (ref 0–5)
Eosinophils Absolute: 0.1 10*3/uL (ref 0.0–0.7)
HCT: 34.8 % — ABNORMAL LOW (ref 36.0–46.0)
Hemoglobin: 11.3 g/dL — ABNORMAL LOW (ref 12.0–15.0)
LYMPHS PCT: 27 % (ref 12–46)
Lymphs Abs: 2.4 10*3/uL (ref 0.7–4.0)
MCH: 28.3 pg (ref 26.0–34.0)
MCHC: 32.5 g/dL (ref 30.0–36.0)
MCV: 87 fL (ref 78.0–100.0)
Monocytes Absolute: 0.6 10*3/uL (ref 0.1–1.0)
Monocytes Relative: 7 % (ref 3–12)
Neutro Abs: 5.5 10*3/uL (ref 1.7–7.7)
Neutrophils Relative %: 65 % (ref 43–77)
PLATELETS: 262 10*3/uL (ref 150–400)
RBC: 4 MIL/uL (ref 3.87–5.11)
RDW: 13.4 % (ref 11.5–15.5)
WBC: 8.6 10*3/uL (ref 4.0–10.5)

## 2014-08-16 LAB — HEPATIC FUNCTION PANEL
ALBUMIN: 4 g/dL (ref 3.5–5.2)
ALK PHOS: 89 U/L (ref 39–117)
ALT: 21 U/L (ref 0–35)
AST: 25 U/L (ref 0–37)
Bilirubin, Direct: <0.1 mg/dL (ref 0.0–0.5)
Total Bilirubin: 0.3 mg/dL (ref 0.3–1.2)
Total Protein: 7.3 g/dL (ref 6.0–8.3)

## 2014-08-16 LAB — I-STAT TROPONIN, ED: Troponin i, poc: 0 ng/mL (ref 0.00–0.08)

## 2014-08-16 MED ORDER — ONDANSETRON HCL 4 MG/2ML IJ SOLN
4.0000 mg | Freq: Once | INTRAMUSCULAR | Status: AC
  Administered 2014-08-16: 4 mg via INTRAVENOUS
  Filled 2014-08-16: qty 2

## 2014-08-16 MED ORDER — AZITHROMYCIN 250 MG PO TABS
500.0000 mg | ORAL_TABLET | Freq: Once | ORAL | Status: AC
  Administered 2014-08-17: 500 mg via ORAL
  Filled 2014-08-16: qty 2

## 2014-08-16 MED ORDER — SODIUM CHLORIDE 0.9 % IV BOLUS (SEPSIS)
1000.0000 mL | Freq: Once | INTRAVENOUS | Status: AC
Start: 2014-08-16 — End: 2014-08-17
  Administered 2014-08-16: 1000 mL via INTRAVENOUS

## 2014-08-16 MED ORDER — HYDROMORPHONE HCL 1 MG/ML IJ SOLN
1.0000 mg | Freq: Once | INTRAMUSCULAR | Status: AC
  Administered 2014-08-16: 1 mg via INTRAVENOUS
  Filled 2014-08-16: qty 1

## 2014-08-16 MED ORDER — AZITHROMYCIN 250 MG PO TABS: 250.0000 mg | ORAL_TABLET | Freq: Every day | ORAL | Status: DC

## 2014-08-16 NOTE — ED Notes (Signed)
Pt has hx of back problems and reports she was walking her dog when she fell.

## 2014-08-16 NOTE — Discharge Instructions (Signed)
Follow up with your family md this week for recheck

## 2014-08-16 NOTE — ED Notes (Signed)
Pt ambulated to restroom with steady gait.
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
## 2014-08-18 ENCOUNTER — Encounter: Payer: Self-pay | Admitting: Internal Medicine

## 2014-08-18 ENCOUNTER — Other Ambulatory Visit: Payer: 59

## 2014-08-18 ENCOUNTER — Ambulatory Visit (INDEPENDENT_AMBULATORY_CARE_PROVIDER_SITE_OTHER): Payer: 59 | Admitting: Internal Medicine

## 2014-08-18 VITALS — BP 158/68 | HR 81 | Temp 98.8°F | Resp 16 | Wt 156.0 lb

## 2014-08-18 DIAGNOSIS — N3000 Acute cystitis without hematuria: Secondary | ICD-10-CM

## 2014-08-18 DIAGNOSIS — R309 Painful micturition, unspecified: Secondary | ICD-10-CM

## 2014-08-18 DIAGNOSIS — R5382 Chronic fatigue, unspecified: Secondary | ICD-10-CM | POA: Diagnosis not present

## 2014-08-18 DIAGNOSIS — G9332 Myalgic encephalomyelitis/chronic fatigue syndrome: Secondary | ICD-10-CM

## 2014-08-18 DIAGNOSIS — M797 Fibromyalgia: Secondary | ICD-10-CM

## 2014-08-18 LAB — POCT URINALYSIS DIPSTICK
Bilirubin, UA: NEGATIVE
GLUCOSE UA: NEGATIVE
Ketones, UA: NEGATIVE
Nitrite, UA: NEGATIVE
Protein, UA: 0.15
Spec Grav, UA: 1.025
UROBILINOGEN UA: 4
pH, UA: 6

## 2014-08-18 MED ORDER — FOSFOMYCIN TROMETHAMINE 3 G PO PACK: 3.0000 g | PACK | Freq: Once | ORAL | Status: DC

## 2014-08-18 MED ORDER — PREGABALIN 75 MG PO CAPS: 75.0000 mg | ORAL_CAPSULE | Freq: Two times a day (BID) | ORAL | Status: DC

## 2014-08-18 NOTE — Assessment & Plan Note (Signed)
Rx for fosfomycin today as she has leukocytes on her urine. Sent for culture.

## 2014-08-18 NOTE — Assessment & Plan Note (Signed)
She is now off methadone for 5 days and is experiencing withdrawal. I have informed her as of our previous discussion that I am unable to prescribe narcotics specifically methadone for her today as this would be in violation of the pain clinic and that I have told them that I am unable to rx narcotics for her anymore. She needs to work this out with the pain clinic and may have to attend their methadone clinic if she needs to be on methadone. Rx for lyrica given today to try since I do not see it on her allergy list and do not see it among her past medications in her rx list (if she has been on it was before epic system in place). She did have apt with pain clinic yesterday and has another appointment tomorrow with them. She also did not return since September and states that she did not know although it was clearly listed on her AVS to return in 3 months if the pain clinic was unable to see her before then.

## 2014-08-18 NOTE — Patient Instructions (Addendum)
We are not able to give you the methadone prescription today because we have said that we will not since you are going to the pain clinic. It would be a violation of their policies to get medicine from another doctor. We can try a different medicine for nerve pain and see if it helps until they are able to try something else tomorrow. Since you are off the methadone I would strongly recommend staying off it possible. We can call the pain clinic today and let them know that we are not able to do that but that you are off the methadone.   We have checked the urine today and you do have signs of an infection. We have sent in fosfomycin which is for the infection. You mix it up and drink it once and it lasts for several days.

## 2014-08-18 NOTE — Progress Notes (Signed)
   Subjective:    Patient ID: [name], female    DOB: 07-24-1950, 64 y.o.   MRN: [id number]  HPI The patient is a 64 YO female who is here to follow up on her chronic pain. She has had several ER visits lately for falls and dizziness. She has seen the pain clinic in March and they wanted her to come to her methadone clinic which she did not feel she could go to. So she did not go and then tried to refill her methadone through our clinic (we sent letter of dismissal from pain management in February). She has not had any methadone since last Friday and since that time she has been feeling weak and falling a lot and feeling bad overall. Denies vomiting or change in her diarrhea. She saw the pain clinic yesterday and states that she was told to come to her PCP and get another methadone rx and get to 60 mg per day so they can switch her to something else. They did not like that she was off altogether.   After our 30 minute visit she follows me into the hallway to mention that she thinks she has a urine infection with increased urgency, frequency, mild burning.   Review of Systems  Constitutional: Positive for fatigue. Negative for fever, chills, activity change and appetite change.  Respiratory: Negative for chest tightness, shortness of breath and wheezing.   Cardiovascular: Negative for chest pain, palpitations and leg swelling.  Gastrointestinal: Positive for abdominal pain and diarrhea.  Musculoskeletal: Positive for myalgias and arthralgias.  Allergic/Immunologic: Positive for environmental allergies.  Neurological: Positive for dizziness, weakness and light-headedness. Negative for syncope, numbness and headaches.      Objective:   Physical Exam  Constitutional: She is oriented to person, place, and time. She appears well-developed and well-nourished.  HENT:  Head: Normocephalic and atraumatic.  Eyes: EOM are normal.  Neck: Normal range of motion. No JVD present.  Cardiovascular:  Normal rate and regular rhythm.   Pulmonary/Chest: Effort normal and breath sounds normal. No respiratory distress. She has no wheezes. She has no rales. She exhibits no tenderness.  Abdominal: Soft. Bowel sounds are normal. She exhibits distension. There is tenderness. There is no rebound and no guarding.  Neurological: She is alert and oriented to person, place, and time.  Stable gait  Skin: Skin is warm and dry.  Psychiatric:  Rambling and talking quite a bit   Filed Vitals:   08/18/14 0951  BP: 158/68  Pulse: 81  Temp: 98.8 F (37.1 C)  TempSrc: Oral  Resp: 16  Weight: 156 lb (70.761 kg)  SpO2: 96%      Assessment & Plan:  Visit time 25 minutes, greater than 50% of which was spent in counseling and coordination of care with the patient face to face. Counseling about drug contracts and pain clinic etiquette as well as symptoms of opiate withdrawal as well as specifically methadone withdrawal.

## 2014-08-18 NOTE — Progress Notes (Signed)
Pre visit review using our clinic review tool, if applicable. No additional management support is needed unless otherwise documented below in the visit note. 

## 2014-08-20 ENCOUNTER — Ambulatory Visit: Payer: Self-pay | Admitting: Internal Medicine

## 2014-08-22 LAB — URINE CULTURE: Colony Count: 30000

## 2014-08-25 ENCOUNTER — Emergency Department (HOSPITAL_COMMUNITY): Payer: 59

## 2014-08-25 ENCOUNTER — Encounter (HOSPITAL_COMMUNITY): Payer: Self-pay | Admitting: Emergency Medicine

## 2014-08-25 ENCOUNTER — Emergency Department (HOSPITAL_COMMUNITY)
Admission: EM | Admit: 2014-08-25 | Discharge: 2014-08-25 | Disposition: A | Discharge status: Home / Self Care | Payer: 59 | Attending: Emergency Medicine | Admitting: Emergency Medicine

## 2014-08-25 DIAGNOSIS — F1310 Sedative, hypnotic or anxiolytic abuse, uncomplicated: Secondary | ICD-10-CM | POA: Insufficient documentation

## 2014-08-25 DIAGNOSIS — Y9289 Other specified places as the place of occurrence of the external cause: Secondary | ICD-10-CM | POA: Diagnosis not present

## 2014-08-25 DIAGNOSIS — S3992XA Unspecified injury of lower back, initial encounter: Secondary | ICD-10-CM | POA: Diagnosis not present

## 2014-08-25 DIAGNOSIS — M1990 Unspecified osteoarthritis, unspecified site: Secondary | ICD-10-CM | POA: Insufficient documentation

## 2014-08-25 DIAGNOSIS — W19XXXA Unspecified fall, initial encounter: Secondary | ICD-10-CM

## 2014-08-25 DIAGNOSIS — G43909 Migraine, unspecified, not intractable, without status migrainosus: Secondary | ICD-10-CM | POA: Insufficient documentation

## 2014-08-25 DIAGNOSIS — G8929 Other chronic pain: Secondary | ICD-10-CM | POA: Diagnosis not present

## 2014-08-25 DIAGNOSIS — E785 Hyperlipidemia, unspecified: Secondary | ICD-10-CM | POA: Insufficient documentation

## 2014-08-25 DIAGNOSIS — S5011XA Contusion of right forearm, initial encounter: Secondary | ICD-10-CM | POA: Diagnosis not present

## 2014-08-25 DIAGNOSIS — Z85828 Personal history of other malignant neoplasm of skin: Secondary | ICD-10-CM | POA: Insufficient documentation

## 2014-08-25 DIAGNOSIS — W1839XA Other fall on same level, initial encounter: Secondary | ICD-10-CM | POA: Diagnosis not present

## 2014-08-25 DIAGNOSIS — Y998 Other external cause status: Secondary | ICD-10-CM | POA: Insufficient documentation

## 2014-08-25 DIAGNOSIS — I10 Essential (primary) hypertension: Secondary | ICD-10-CM | POA: Insufficient documentation

## 2014-08-25 DIAGNOSIS — Z8719 Personal history of other diseases of the digestive system: Secondary | ICD-10-CM | POA: Insufficient documentation

## 2014-08-25 DIAGNOSIS — Y9389 Activity, other specified: Secondary | ICD-10-CM | POA: Diagnosis not present

## 2014-08-25 DIAGNOSIS — E039 Hypothyroidism, unspecified: Secondary | ICD-10-CM | POA: Insufficient documentation

## 2014-08-25 DIAGNOSIS — S59911A Unspecified injury of right forearm, initial encounter: Secondary | ICD-10-CM | POA: Diagnosis present

## 2014-08-25 DIAGNOSIS — M797 Fibromyalgia: Secondary | ICD-10-CM | POA: Diagnosis not present

## 2014-08-25 DIAGNOSIS — M79631 Pain in right forearm: Secondary | ICD-10-CM

## 2014-08-25 DIAGNOSIS — J45909 Unspecified asthma, uncomplicated: Secondary | ICD-10-CM | POA: Insufficient documentation

## 2014-08-25 DIAGNOSIS — Z79899 Other long term (current) drug therapy: Secondary | ICD-10-CM | POA: Diagnosis not present

## 2014-08-25 LAB — CBC WITH DIFFERENTIAL/PLATELET
BASOS ABS: 0 10*3/uL (ref 0.0–0.1)
BASOS PCT: 0 % (ref 0–1)
Eosinophils Absolute: 0 10*3/uL (ref 0.0–0.7)
Eosinophils Relative: 0 % (ref 0–5)
HCT: 42.6 % (ref 36.0–46.0)
Hemoglobin: 13.9 g/dL (ref 12.0–15.0)
Lymphocytes Relative: 21 % (ref 12–46)
Lymphs Abs: 1.8 10*3/uL (ref 0.7–4.0)
MCH: 28.3 pg (ref 26.0–34.0)
MCHC: 32.6 g/dL (ref 30.0–36.0)
MCV: 86.6 fL (ref 78.0–100.0)
Monocytes Absolute: 0.5 10*3/uL (ref 0.1–1.0)
Monocytes Relative: 6 % (ref 3–12)
NEUTROS ABS: 6.2 10*3/uL (ref 1.7–7.7)
NEUTROS PCT: 73 % (ref 43–77)
Platelets: 312 10*3/uL (ref 150–400)
RBC: 4.92 MIL/uL (ref 3.87–5.11)
RDW: 13.7 % (ref 11.5–15.5)
WBC: 8.5 10*3/uL (ref 4.0–10.5)

## 2014-08-25 LAB — URINALYSIS, ROUTINE W REFLEX MICROSCOPIC
BILIRUBIN URINE: NEGATIVE
GLUCOSE, UA: NEGATIVE mg/dL
KETONES UR: 15 mg/dL — AB
Nitrite: NEGATIVE
PH: 6 (ref 5.0–8.0)
Protein, ur: NEGATIVE mg/dL
SPECIFIC GRAVITY, URINE: 1.017 (ref 1.005–1.030)
Urobilinogen, UA: 0.2 mg/dL (ref 0.0–1.0)

## 2014-08-25 LAB — RAPID URINE DRUG SCREEN, HOSP PERFORMED
Amphetamines: NOT DETECTED
BARBITURATES: NOT DETECTED
Benzodiazepines: POSITIVE — AB
Cocaine: NOT DETECTED
Opiates: NOT DETECTED
TETRAHYDROCANNABINOL: NOT DETECTED

## 2014-08-25 LAB — BASIC METABOLIC PANEL
Anion gap: 7 (ref 5–15)
BUN: 27 mg/dL — ABNORMAL HIGH (ref 6–20)
CALCIUM: 9.2 mg/dL (ref 8.9–10.3)
CO2: 22 mmol/L (ref 22–32)
Chloride: 108 mmol/L (ref 101–111)
Creatinine, Ser: 1.12 mg/dL — ABNORMAL HIGH (ref 0.44–1.00)
GFR calc Af Amer: 60 mL/min — ABNORMAL LOW (ref >60)
GFR calc non Af Amer: 52 mL/min — ABNORMAL LOW (ref >60)
Glucose, Bld: 98 mg/dL (ref 70–99)
POTASSIUM: 3.4 mmol/L — AB (ref 3.5–5.1)
SODIUM: 137 mmol/L (ref 135–145)

## 2014-08-25 LAB — URINE MICROSCOPIC-ADD ON

## 2014-08-25 MED ORDER — SODIUM CHLORIDE 0.9 % IV BOLUS (SEPSIS)
1000.0000 mL | Freq: Once | INTRAVENOUS | Status: AC
  Administered 2014-08-25: 1000 mL via INTRAVENOUS
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
## 2014-08-25 NOTE — ED Notes (Signed)
Pt aware that she still needs to provide a urine sample. Pt also aware that the PA has ordered an in and out cath if patient is unable to void. Pt refusing in and out cath and reports that she will be able to provide a urine sample once she has received the IV fluids.

## 2014-08-25 NOTE — ED Notes (Signed)
Bed: WS56 Expected date: 08/25/14 Expected time: 4:20 AM Means of arrival: Ambulance Comments: Global pain, off methadone

## 2014-08-25 NOTE — ED Notes (Addendum)
Patient presents from home via EMS for "global" pain. Patient goes to methadone clinic and has not had methadone x2 weeks. Patient c/o falls, unable to swallow home medications, unable to stand straight.

## 2014-08-25 NOTE — Discharge Instructions (Signed)
Please drink lots of water at home and have your kidney function rechecked by your primary care provider next week. Fall Prevention and Home Safety Falls cause injuries and can affect all age groups. It is possible to use preventive measures to significantly decrease the likelihood of falls. There are many simple measures which can make your home safer and prevent falls. OUTDOORS  Repair cracks and edges of walkways and driveways.  Remove high doorway thresholds.  Trim shrubbery on the main path into your home.  Have good outside lighting.  Clear walkways of tools, rocks, debris, and clutter.  Check that handrails are not broken and are securely fastened. Both sides of steps should have handrails.  Have leaves, snow, and ice cleared regularly.  Use sand or salt on walkways during winter months.  In the garage, clean up grease or oil spills. BATHROOM  Install night lights.  Install grab bars by the toilet and in the tub and shower.  Use non-skid mats or decals in the tub or shower.  Place a plastic non-slip stool in the shower to sit on, if needed.  Keep floors dry and clean up all water on the floor immediately.  Remove soap buildup in the tub or shower on a regular basis.  Secure bath mats with non-slip, double-sided rug tape.  Remove throw rugs and tripping hazards from the floors. BEDROOMS  Install night lights.  Make sure a bedside light is easy to reach.  Do not use oversized bedding.  Keep a telephone by your bedside.  Have a firm chair with side arms to use for getting dressed.  Remove throw rugs and tripping hazards from the floor. KITCHEN  Keep handles on pots and pans turned toward the center of the stove. Use back burners when possible.  Clean up spills quickly and allow time for drying.  Avoid walking on wet floors.  Avoid hot utensils and knives.  Position shelves so they are not too high or low.  Place commonly used objects within easy  reach.  If necessary, use a sturdy step stool with a grab bar when reaching.  Keep electrical cables out of the way.  Do not use floor polish or wax that makes floors slippery. If you must use wax, use non-skid floor wax.  Remove throw rugs and tripping hazards from the floor. STAIRWAYS  Never leave objects on stairs.  Place handrails on both sides of stairways and use them. Fix any loose handrails. Make sure handrails on both sides of the stairways are as long as the stairs.  Check carpeting to make sure it is firmly attached along stairs. Make repairs to worn or loose carpet promptly.  Avoid placing throw rugs at the top or bottom of stairways, or properly secure the rug with carpet tape to prevent slippage. Get rid of throw rugs, if possible.  Have an electrician put in a light switch at the top and bottom of the stairs. OTHER FALL PREVENTION TIPS  Wear low-heel or rubber-soled shoes that are supportive and fit well. Wear closed toe shoes.  When using a stepladder, make sure it is fully opened and both spreaders are firmly locked. Do not climb a closed stepladder.  Add color or contrast paint or tape to grab bars and handrails in your home. Place contrasting color strips on first and last steps.  Learn and use mobility aids as needed. Install an electrical emergency response system.  Turn on lights to avoid dark areas. Replace light bulbs that burn  out immediately. Get light switches that glow.  Arrange furniture to create clear pathways. Keep furniture in the same place.  Firmly attach carpet with non-skid or double-sided tape.  Eliminate uneven floor surfaces.  Select a carpet pattern that does not visually hide the edge of steps.  Be aware of all pets. OTHER HOME SAFETY TIPS  Set the water temperature for 120 F (48.8 C).  Keep emergency numbers on or near the telephone.  Keep smoke detectors on every level of the home and near sleeping areas. Document Released:  03/31/2002 Document Revised: 10/10/2011 Document Reviewed: 06/30/2011 [facility] Patient Information 2015 [place], [place]. This information is not intended to replace advice given to you by your health care provider. Make sure you discuss any questions you have with your health care provider. Chronic Pain Chronic pain can be defined as pain that is off and on and lasts for 3-6 months or longer. Many things cause chronic pain, which can make it difficult to make a diagnosis. There are many treatment options available for chronic pain. However, finding a treatment that works well for you may require trying various approaches until the right one is found. Many people benefit from a combination of two or more types of treatment to control their pain. SYMPTOMS  Chronic pain can occur anywhere in the body and can range from mild to very severe. Some types of chronic pain include:  Headache.  Low back pain.  Cancer pain.  Arthritis pain.  Neurogenic pain. This is pain resulting from damage to nerves. People with chronic pain may also have other symptoms such as:  Depression.  Anger.  Insomnia.  Anxiety. DIAGNOSIS  Your health care provider will help diagnose your condition over time. In many cases, the initial focus will be on excluding possible conditions that could be causing the pain. Depending on your symptoms, your health care provider may order tests to diagnose your condition. Some of these tests may include:   Blood tests.   CT scan.   MRI.   X-rays.   Ultrasounds.   Nerve conduction studies.  You may need to see a specialist.  TREATMENT  Finding treatment that works well may take time. You may be referred to a pain specialist. He or she may prescribe medicine or therapies, such as:   Mindful meditation or yoga.  Shots (injections) of numbing or pain-relieving medicines into the spine or area of pain.  Local electrical stimulation.  Acupuncture.   Massage  therapy.   Aroma, color, light, or sound therapy.   Biofeedback.   Working with a physical therapist to keep from getting stiff.   Regular, gentle exercise.   Cognitive or behavioral therapy.   Group support.  Sometimes, surgery may be recommended.  HOME CARE INSTRUCTIONS   Take all medicines as directed by your health care provider.   Lessen stress in your life by relaxing and doing things such as listening to calming music.   Exercise or be active as directed by your health care provider.   Eat a healthy diet and include things such as vegetables, fruits, fish, and lean meats in your diet.   Keep all follow-up appointments with your health care provider.   Attend a support group with others suffering from chronic pain. SEEK MEDICAL CARE IF:   Your pain gets worse.   You develop a new pain that was not there before.   You cannot tolerate medicines given to you by your health care provider.   You have  new symptoms since your last visit with your health care provider.  SEEK IMMEDIATE MEDICAL CARE IF:   You feel weak.   You have decreased sensation or numbness.   You lose control of bowel or bladder function.   Your pain suddenly gets much worse.   You develop shaking.  You develop chills.  You develop confusion.  You develop chest pain.  You develop shortness of breath.  MAKE SURE YOU:  Understand these instructions.  Will watch your condition.  Will get help right away if you are not doing well or get worse. Document Released: 12/31/2001 Document Revised: 12/11/2012 Document Reviewed: 10/04/2012 [facility] Patient Information 2015 [place], [place]. This information is not intended to replace advice given to you by your health care provider. Make sure you discuss any questions you have with your health care provider.

## 2014-08-25 NOTE — ED Notes (Signed)
When asked if patient could provide urine specimen using bed pan, patient states "nothing has come out of me in two days". Mentioned in and out catheter for urine and patient states "I'll get a bladder infection".

## 2014-08-26 LAB — URINE CULTURE: Colony Count: 15000

## 2014-08-31 ENCOUNTER — Telehealth: Payer: Self-pay | Admitting: Internal Medicine

## 2014-08-31 DIAGNOSIS — R29898 Other symptoms and signs involving the musculoskeletal system: Secondary | ICD-10-CM

## 2014-08-31 NOTE — Telephone Encounter (Signed)
Patient is requesting referral to [facility] - DR. Willis.  States legs have been giving out from under her.  Patient is requesting urgent rush on referral.

## 2014-08-31 NOTE — Telephone Encounter (Signed)
Done

## 2014-08-31 NOTE — Telephone Encounter (Signed)
Patient is

## 2014-09-02 ENCOUNTER — Encounter: Payer: Self-pay | Admitting: Diagnostic Neuroimaging

## 2014-09-02 ENCOUNTER — Ambulatory Visit (INDEPENDENT_AMBULATORY_CARE_PROVIDER_SITE_OTHER): Payer: 59 | Admitting: Diagnostic Neuroimaging

## 2014-09-02 VITALS — BP 148/96 | HR 95 | Ht 64.25 in | Wt 143.3 lb

## 2014-09-02 DIAGNOSIS — M5416 Radiculopathy, lumbar region: Secondary | ICD-10-CM

## 2014-09-02 DIAGNOSIS — R269 Unspecified abnormalities of gait and mobility: Secondary | ICD-10-CM | POA: Diagnosis not present

## 2014-09-02 NOTE — Patient Instructions (Signed)
I will check MRI lumbar spine and refer you to physical therapy.
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
## 2014-09-03 ENCOUNTER — Encounter (HOSPITAL_COMMUNITY): Payer: Self-pay

## 2014-09-03 ENCOUNTER — Emergency Department (HOSPITAL_COMMUNITY)
Admission: EM | Admit: 2014-09-03 | Discharge: 2014-09-03 | Discharge status: Against Medical Advice | Payer: 59 | Attending: Emergency Medicine | Admitting: Emergency Medicine

## 2014-09-03 ENCOUNTER — Ambulatory Visit (INDEPENDENT_AMBULATORY_CARE_PROVIDER_SITE_OTHER): Payer: 59

## 2014-09-03 DIAGNOSIS — Z85828 Personal history of other malignant neoplasm of skin: Secondary | ICD-10-CM | POA: Insufficient documentation

## 2014-09-03 DIAGNOSIS — R269 Unspecified abnormalities of gait and mobility: Secondary | ICD-10-CM | POA: Diagnosis not present

## 2014-09-03 DIAGNOSIS — R531 Weakness: Secondary | ICD-10-CM | POA: Diagnosis not present

## 2014-09-03 DIAGNOSIS — I10 Essential (primary) hypertension: Secondary | ICD-10-CM | POA: Insufficient documentation

## 2014-09-03 DIAGNOSIS — G8929 Other chronic pain: Secondary | ICD-10-CM | POA: Insufficient documentation

## 2014-09-03 DIAGNOSIS — M5416 Radiculopathy, lumbar region: Secondary | ICD-10-CM | POA: Diagnosis not present

## 2014-09-03 DIAGNOSIS — J45909 Unspecified asthma, uncomplicated: Secondary | ICD-10-CM | POA: Insufficient documentation

## 2014-09-03 DIAGNOSIS — R1110 Vomiting, unspecified: Secondary | ICD-10-CM | POA: Diagnosis not present

## 2014-09-03 NOTE — ED Notes (Addendum)
Pt states that she does not wish to stay, states that she is too dehydrated. Explained that pt will get one of the next available rooms. Pt still does not want to stay. Chris EMT offered to wheel pt out in wheel chair, pt refused wheelchair and ambulated independently out of the ED with her walker.

## 2014-09-03 NOTE — ED Notes (Signed)
Pt states she started getting sick last night and vomiting last night about 2300. Pt started having problems with her legs about 4 weeks ago. Noticed she walking funny and falling down. [facility] and [facility] today. States it feels like her legs are in a vice.

## 2014-09-07 ENCOUNTER — Telehealth: Payer: Self-pay | Admitting: Internal Medicine

## 2014-09-07 ENCOUNTER — Telehealth: Payer: Self-pay | Admitting: Diagnostic Neuroimaging

## 2014-09-07 ENCOUNTER — Other Ambulatory Visit: Payer: Self-pay | Admitting: Internal Medicine

## 2014-09-07 DIAGNOSIS — K589 Irritable bowel syndrome without diarrhea: Secondary | ICD-10-CM

## 2014-09-07 NOTE — Telephone Encounter (Signed)
Patient called and requested to speak with the nurse regarding the results to her MRI. Please call and advise.

## 2014-09-07 NOTE — Telephone Encounter (Signed)
Patient aware that referral has been placed.  

## 2014-09-07 NOTE — Telephone Encounter (Signed)
placed

## 2014-09-07 NOTE — Telephone Encounter (Signed)
Pt called in and is needing referral to see GI.  She said that she has an appt 6/21 with Dr Olevia Perches

## 2014-09-08 NOTE — Telephone Encounter (Signed)
Spoke with Dr. Leta Baptist about the MRI. He told me to call her and let her know that her MRI showed degenerative changes.   Called the pt and spoke with her, told her about her MRI results and encouraged her to follow up with physical therapy and gave her the number to neuro rehab next door. She also told me that she has had a migraine now for 14 days, is throwing up almost nightly and has gone to the ER x3; twice for falls and once for dehydration. She wants to know if there is anything that Dr. Leta Baptist can do to help her. i told her that I would get back with her after I talked with him this afternoon. She thanked me

## 2014-09-08 NOTE — Telephone Encounter (Signed)
Spoke with Dr. Leta Baptist about the pts migraine issues and what did he want the pt to do. He asked me to call her back and inform her that if her migraine was making her dehydrated, have multiple falls, and throw up every morning, she needed to go to the ER and get a cocktail to break it. She told me that if it got to that point again she would call EMS to come get her. She thanked me

## 2014-09-08 NOTE — Telephone Encounter (Signed)
Patient called wanting to follow up on her message from 09/07/14. She would like to hear something back today regarding her results. Please call and advise. Patient can be reached @ [phone]

## 2014-09-09 ENCOUNTER — Encounter (HOSPITAL_COMMUNITY): Payer: Self-pay | Admitting: Emergency Medicine

## 2014-09-09 ENCOUNTER — Emergency Department (HOSPITAL_COMMUNITY)
Admission: EM | Admit: 2014-09-09 | Discharge: 2014-09-09 | Disposition: A | Discharge status: Home / Self Care | Payer: 59 | Attending: Emergency Medicine | Admitting: Emergency Medicine

## 2014-09-09 ENCOUNTER — Emergency Department (HOSPITAL_COMMUNITY): Payer: 59

## 2014-09-09 DIAGNOSIS — J45901 Unspecified asthma with (acute) exacerbation: Secondary | ICD-10-CM | POA: Diagnosis not present

## 2014-09-09 DIAGNOSIS — R110 Nausea: Secondary | ICD-10-CM | POA: Diagnosis not present

## 2014-09-09 DIAGNOSIS — G8929 Other chronic pain: Secondary | ICD-10-CM | POA: Insufficient documentation

## 2014-09-09 DIAGNOSIS — N3000 Acute cystitis without hematuria: Secondary | ICD-10-CM | POA: Diagnosis not present

## 2014-09-09 DIAGNOSIS — Z8719 Personal history of other diseases of the digestive system: Secondary | ICD-10-CM | POA: Insufficient documentation

## 2014-09-09 DIAGNOSIS — Z79899 Other long term (current) drug therapy: Secondary | ICD-10-CM | POA: Diagnosis not present

## 2014-09-09 DIAGNOSIS — R519 Headache, unspecified: Secondary | ICD-10-CM

## 2014-09-09 DIAGNOSIS — R079 Chest pain, unspecified: Secondary | ICD-10-CM | POA: Diagnosis not present

## 2014-09-09 DIAGNOSIS — G43909 Migraine, unspecified, not intractable, without status migrainosus: Secondary | ICD-10-CM | POA: Insufficient documentation

## 2014-09-09 DIAGNOSIS — R51 Headache: Secondary | ICD-10-CM

## 2014-09-09 DIAGNOSIS — M549 Dorsalgia, unspecified: Secondary | ICD-10-CM | POA: Diagnosis present

## 2014-09-09 DIAGNOSIS — Z85828 Personal history of other malignant neoplasm of skin: Secondary | ICD-10-CM | POA: Insufficient documentation

## 2014-09-09 DIAGNOSIS — E039 Hypothyroidism, unspecified: Secondary | ICD-10-CM | POA: Diagnosis not present

## 2014-09-09 DIAGNOSIS — M1990 Unspecified osteoarthritis, unspecified site: Secondary | ICD-10-CM | POA: Diagnosis not present

## 2014-09-09 LAB — CBC WITH DIFFERENTIAL/PLATELET
BASOS ABS: 0 10*3/uL (ref 0.0–0.1)
Basophils Relative: 1 % (ref 0–1)
EOS ABS: 0.1 10*3/uL (ref 0.0–0.7)
Eosinophils Relative: 1 % (ref 0–5)
HCT: 37.8 % (ref 36.0–46.0)
Hemoglobin: 11.9 g/dL — ABNORMAL LOW (ref 12.0–15.0)
Lymphocytes Relative: 22 % (ref 12–46)
Lymphs Abs: 1.4 10*3/uL (ref 0.7–4.0)
MCH: 27.4 pg (ref 26.0–34.0)
MCHC: 31.5 g/dL (ref 30.0–36.0)
MCV: 87.1 fL (ref 78.0–100.0)
Monocytes Absolute: 0.4 10*3/uL (ref 0.1–1.0)
Monocytes Relative: 6 % (ref 3–12)
NEUTROS ABS: 4.5 10*3/uL (ref 1.7–7.7)
Neutrophils Relative %: 70 % (ref 43–77)
PLATELETS: 269 10*3/uL (ref 150–400)
RBC: 4.34 MIL/uL (ref 3.87–5.11)
RDW: 13.7 % (ref 11.5–15.5)
WBC: 6.3 10*3/uL (ref 4.0–10.5)

## 2014-09-09 LAB — BASIC METABOLIC PANEL
Anion gap: 9 (ref 5–15)
BUN: 9 mg/dL (ref 6–20)
CO2: 27 mmol/L (ref 22–32)
Calcium: 9.3 mg/dL (ref 8.9–10.3)
Chloride: 105 mmol/L (ref 101–111)
Creatinine, Ser: 0.94 mg/dL (ref 0.44–1.00)
GFR calc Af Amer: >60 mL/min (ref >60)
GFR calc non Af Amer: >60 mL/min (ref >60)
Glucose, Bld: 124 mg/dL — ABNORMAL HIGH (ref 65–99)
Potassium: 4 mmol/L (ref 3.5–5.1)
Sodium: 141 mmol/L (ref 135–145)

## 2014-09-09 LAB — URINE MICROSCOPIC-ADD ON

## 2014-09-09 LAB — URINALYSIS, ROUTINE W REFLEX MICROSCOPIC
Bilirubin Urine: NEGATIVE
GLUCOSE, UA: NEGATIVE mg/dL
Hgb urine dipstick: NEGATIVE
Ketones, ur: NEGATIVE mg/dL
Nitrite: NEGATIVE
PH: 7.5 (ref 5.0–8.0)
Protein, ur: NEGATIVE mg/dL
Specific Gravity, Urine: 1.01 (ref 1.005–1.030)
Urobilinogen, UA: 0.2 mg/dL (ref 0.0–1.0)

## 2014-09-09 LAB — TROPONIN I
Troponin I: <0.03 ng/mL (ref <0.031)
Troponin I: <0.03 ng/mL (ref <0.031)

## 2014-09-09 MED ORDER — DIPHENHYDRAMINE HCL 50 MG/ML IJ SOLN
25.0000 mg | Freq: Once | INTRAMUSCULAR | Status: AC
  Administered 2014-09-09: 25 mg via INTRAVENOUS
  Filled 2014-09-09: qty 1

## 2014-09-09 MED ORDER — ASPIRIN 81 MG PO CHEW: 324.0000 mg | CHEWABLE_TABLET | Freq: Once | ORAL | Status: DC

## 2014-09-09 MED ORDER — SODIUM CHLORIDE 0.9 % IV BOLUS (SEPSIS)
1000.0000 mL | Freq: Once | INTRAVENOUS | Status: AC
  Administered 2014-09-09: 1000 mL via INTRAVENOUS

## 2014-09-09 MED ORDER — CEPHALEXIN 500 MG PO CAPS: 500.0000 mg | ORAL_CAPSULE | Freq: Two times a day (BID) | ORAL | Status: AC

## 2014-09-09 MED ORDER — DEXAMETHASONE SODIUM PHOSPHATE 10 MG/ML IJ SOLN
10.0000 mg | Freq: Once | INTRAMUSCULAR | Status: AC
  Administered 2014-09-09: 10 mg via INTRAVENOUS
  Filled 2014-09-09: qty 1

## 2014-09-09 MED ORDER — KETOROLAC TROMETHAMINE 30 MG/ML IJ SOLN
30.0000 mg | Freq: Once | INTRAMUSCULAR | Status: AC
  Administered 2014-09-09: 30 mg via INTRAVENOUS
  Filled 2014-09-09: qty 1

## 2014-09-09 NOTE — Discharge Instructions (Signed)

## 2014-09-09 NOTE — ED Notes (Signed)
Pts vital signs updated pt getting dressed for discharge.
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
## 2014-09-09 NOTE — ED Notes (Signed)
Pt received ASA 324mg  per EMS,

## 2014-09-09 NOTE — ED Notes (Signed)
Ambulated to BR with assistance, used w/c back to room.

## 2014-09-09 NOTE — ED Notes (Signed)
Pt c/o pain at 10/10-- talking continuously regarding prior health issues, states " I can't imagine going home with this same headache"

## 2014-09-09 NOTE — ED Notes (Addendum)
Pt to ED via [facility]-- with multiple complaints-- states has fallen 35 times in past 3 months, has had back pain, with MRI recently-- migraine for 2 weeks-- "day 13" -- woke up this am with chest pain and difficulty breathing.  Headache is on right side-- "I have had migraine aura for 2 weeks-- I can ease it with Imitrex, but wake up feeling like I want to throw up."  States "I think my migraine is caused by pain-used to be a pain pt". \ Pt has rambling speech, talking continuously about past pain and past illnesses. Chest pain on left side "like squeezing" with shortness of breath.  States "eyes changed to amber with black dots when coming off methadone" Started taking Morphine time released "can't tell you if it's 2mg  or 12mg "

## 2014-09-14 ENCOUNTER — Telehealth: Payer: Self-pay | Admitting: Diagnostic Neuroimaging

## 2014-09-14 NOTE — Telephone Encounter (Signed)
Spent around 10 minutes on the phone with the pt, she told me about being in the ER, read me an article about a laser treatment option for peripheral neuropathy and then told me that she was still having the migraine, now for 17 days straight, and that she was not pleased with her pain MD. I told her that she would need to be scheduled for a new patient consult for her migraines since she was originally seen for lower extremity weakness. She told me that would be fine, appt was made for Friday 09/18/14 and I told her that if she needed to go back to the ER for help with breaking her migraine, she would need to actually sit and wait; I explained that migraines in the ER are not a high priority and she would need to wait for help, not just leave after a few hours. She stated an understanding, and thanked me for calling and getting her an appt

## 2014-09-14 NOTE — Telephone Encounter (Signed)
Patient is calling as she has had a migraine for 17 days and went to the ER but they couldn't help her. They thought she had a heart attack as she had chest pain but they could not find any proof.  She would like to talk about the migraines. Please call.

## 2014-09-15 ENCOUNTER — Telehealth: Payer: Self-pay | Admitting: Nurse Practitioner

## 2014-09-15 NOTE — Telephone Encounter (Signed)
Patient left voicemail requesting referral for mammogram. States she has a form of insurance that requires the referral. Would like order sent to [facility].

## 2014-09-15 NOTE — Telephone Encounter (Signed)
Attempted to reach patient at number provided [phone]. Phone rang and rang with no answer. Need to advised will be happy to place referral for mammogram but with [facility] referrals typically must come directly from her PCP per Leeroy Bock and Ivar Drape.

## 2014-09-16 ENCOUNTER — Telehealth: Payer: Self-pay | Admitting: Internal Medicine

## 2014-09-16 ENCOUNTER — Other Ambulatory Visit: Payer: Self-pay | Admitting: Geriatric Medicine

## 2014-09-16 DIAGNOSIS — R350 Frequency of micturition: Secondary | ICD-10-CM

## 2014-09-16 NOTE — Telephone Encounter (Signed)
Spoke with patient and put orders in for a UA. Patient will come in and leave a sample.

## 2014-09-16 NOTE — Telephone Encounter (Signed)
Patient was in ER 5/18 with a really bad uti and she was told to contact you after her medicine was finished to come in to get a urinalysis. Please call patient.

## 2014-09-18 ENCOUNTER — Encounter: Payer: Self-pay | Admitting: Diagnostic Neuroimaging

## 2014-09-18 ENCOUNTER — Ambulatory Visit (INDEPENDENT_AMBULATORY_CARE_PROVIDER_SITE_OTHER): Payer: 59 | Admitting: Diagnostic Neuroimaging

## 2014-09-18 VITALS — BP 143/82 | HR 85 | Ht 64.25 in | Wt 144.0 lb

## 2014-09-18 DIAGNOSIS — M5416 Radiculopathy, lumbar region: Secondary | ICD-10-CM

## 2014-09-18 DIAGNOSIS — G43101 Migraine with aura, not intractable, with status migrainosus: Secondary | ICD-10-CM | POA: Insufficient documentation

## 2014-09-18 DIAGNOSIS — F449 Dissociative and conversion disorder, unspecified: Secondary | ICD-10-CM

## 2014-09-18 DIAGNOSIS — R269 Unspecified abnormalities of gait and mobility: Secondary | ICD-10-CM
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
## 2014-09-19 ENCOUNTER — Other Ambulatory Visit: Payer: 59

## 2014-09-22 ENCOUNTER — Other Ambulatory Visit (INDEPENDENT_AMBULATORY_CARE_PROVIDER_SITE_OTHER): Payer: 59

## 2014-09-22 DIAGNOSIS — R350 Frequency of micturition: Secondary | ICD-10-CM | POA: Diagnosis not present

## 2014-09-22 LAB — URINALYSIS, ROUTINE W REFLEX MICROSCOPIC
Bilirubin Urine: NEGATIVE
KETONES UR: NEGATIVE
Nitrite: NEGATIVE
Specific Gravity, Urine: 1.02 (ref 1.000–1.030)
Total Protein, Urine: NEGATIVE
UROBILINOGEN UA: 0.2 (ref 0.0–1.0)
Urine Glucose: NEGATIVE
pH: 6 (ref 5.0–8.0)

## 2014-09-23 ENCOUNTER — Encounter: Payer: Self-pay | Admitting: Family

## 2014-09-24 NOTE — Telephone Encounter (Signed)
Attempted to reach patient at number provided [phone]. Phone rang and rang with no answer. Will try again later.

## 2014-10-02 NOTE — Telephone Encounter (Signed)
Returned call to number provided [phone]. Female answered phone. When I asked to speak with patient female states "I am sorry ma'am but she passed away last week. I am her son." Apologized for his loss and that I was returning a call. Again apologized. Son was very Patent attorney.  Routing to provider for final review. Patient agreeable to disposition. Will close encounter.

## 2014-10-13 ENCOUNTER — Ambulatory Visit: Payer: 59 | Admitting: Internal Medicine

## 2014-10-21 ENCOUNTER — Ambulatory Visit: Payer: Self-pay | Admitting: Internal Medicine

## 2014-10-23 ENCOUNTER — Telehealth: Payer: Self-pay | Admitting: Internal Medicine

## 2014-10-23 NOTE — Telephone Encounter (Signed)
Called patient to see if a recent mammogram has been done call both phone numbers, but did not go through.

## 2014-10-23 DEATH — deceased

## 2014-11-04 NOTE — Telephone Encounter (Signed)
error 

## 2014-12-07 ENCOUNTER — Ambulatory Visit: Payer: 59 | Admitting: Diagnostic Neuroimaging

## 2014-12-08 ENCOUNTER — Encounter: Payer: Self-pay | Admitting: Diagnostic Neuroimaging

## 2014-12-21 ENCOUNTER — Ambulatory Visit: Payer: 59 | Admitting: Diagnostic Neuroimaging

## 2015-03-20 IMAGING — CR DG ABDOMEN 2V
2 series · 2 of 2 positions shown · non-contrast
Comparison: September 30, 2010.

CLINICAL DATA: Abdominal pain.

EXAM:
ABDOMEN - 2 VIEW

[view not recorded (1 of 2)]
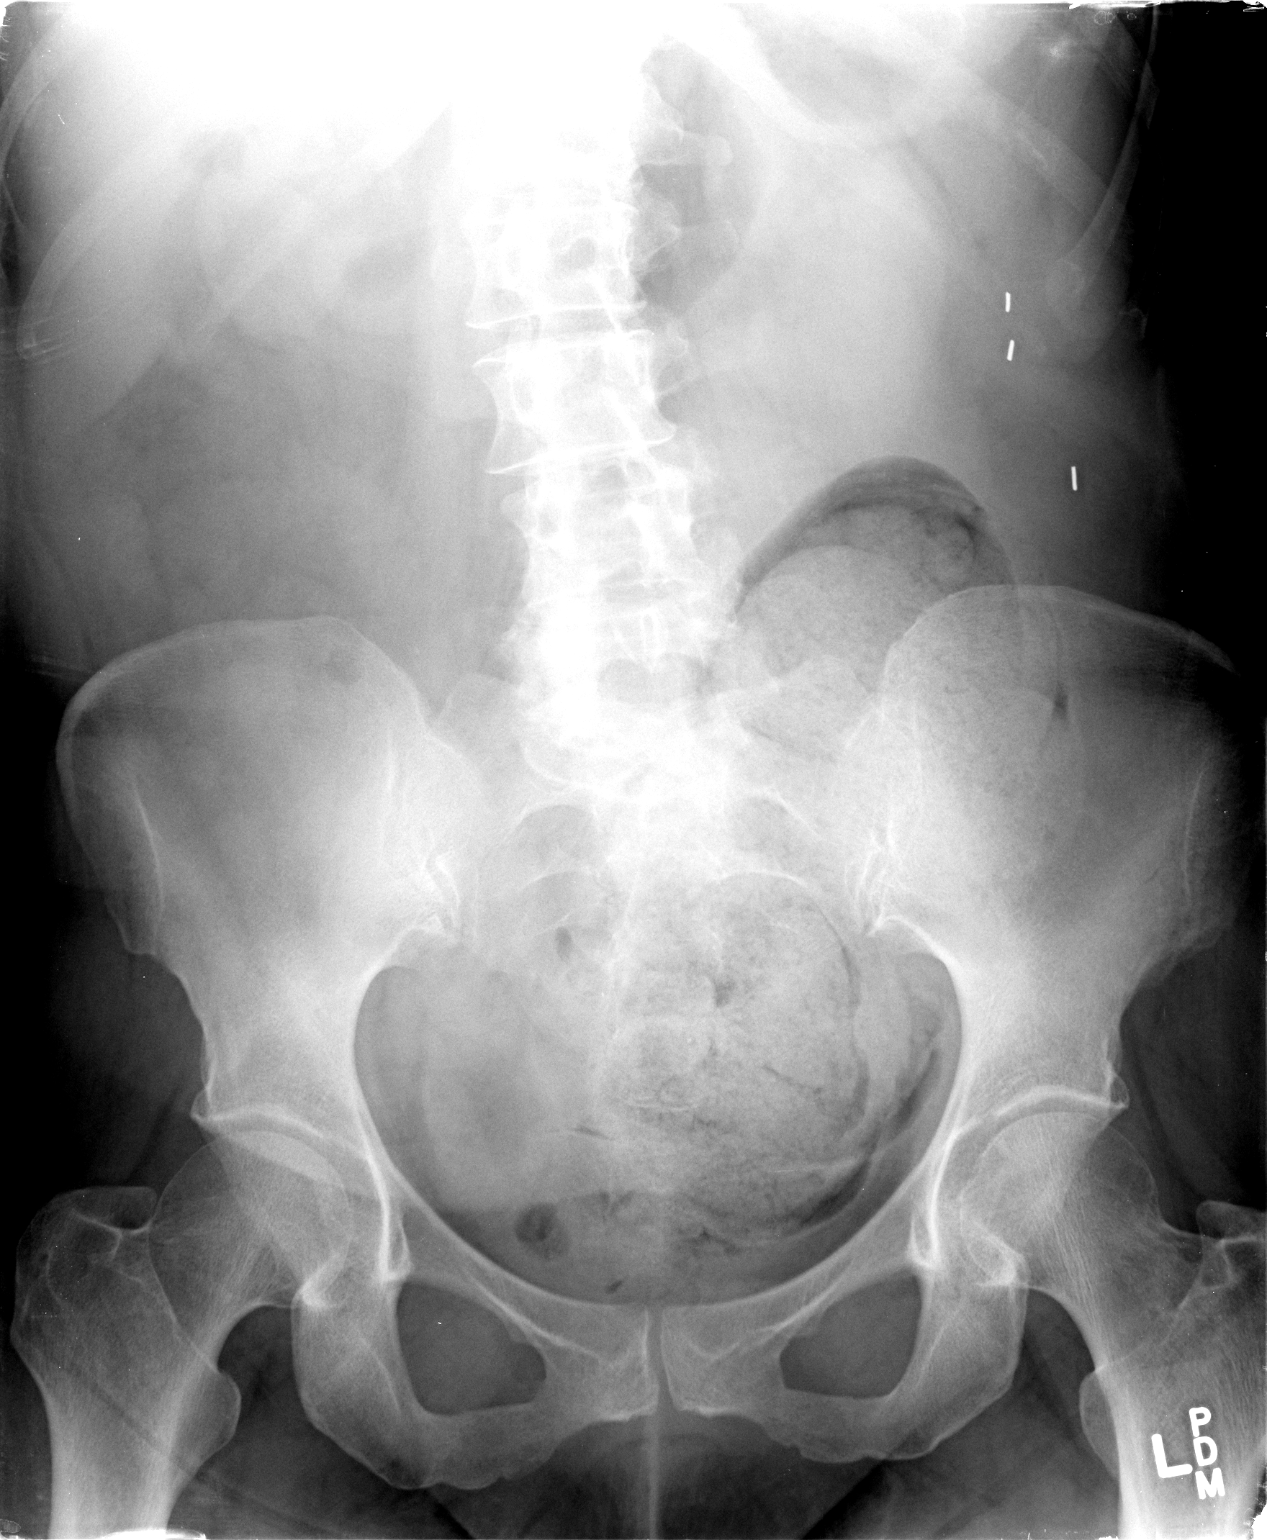

[view not recorded (2 of 2)]
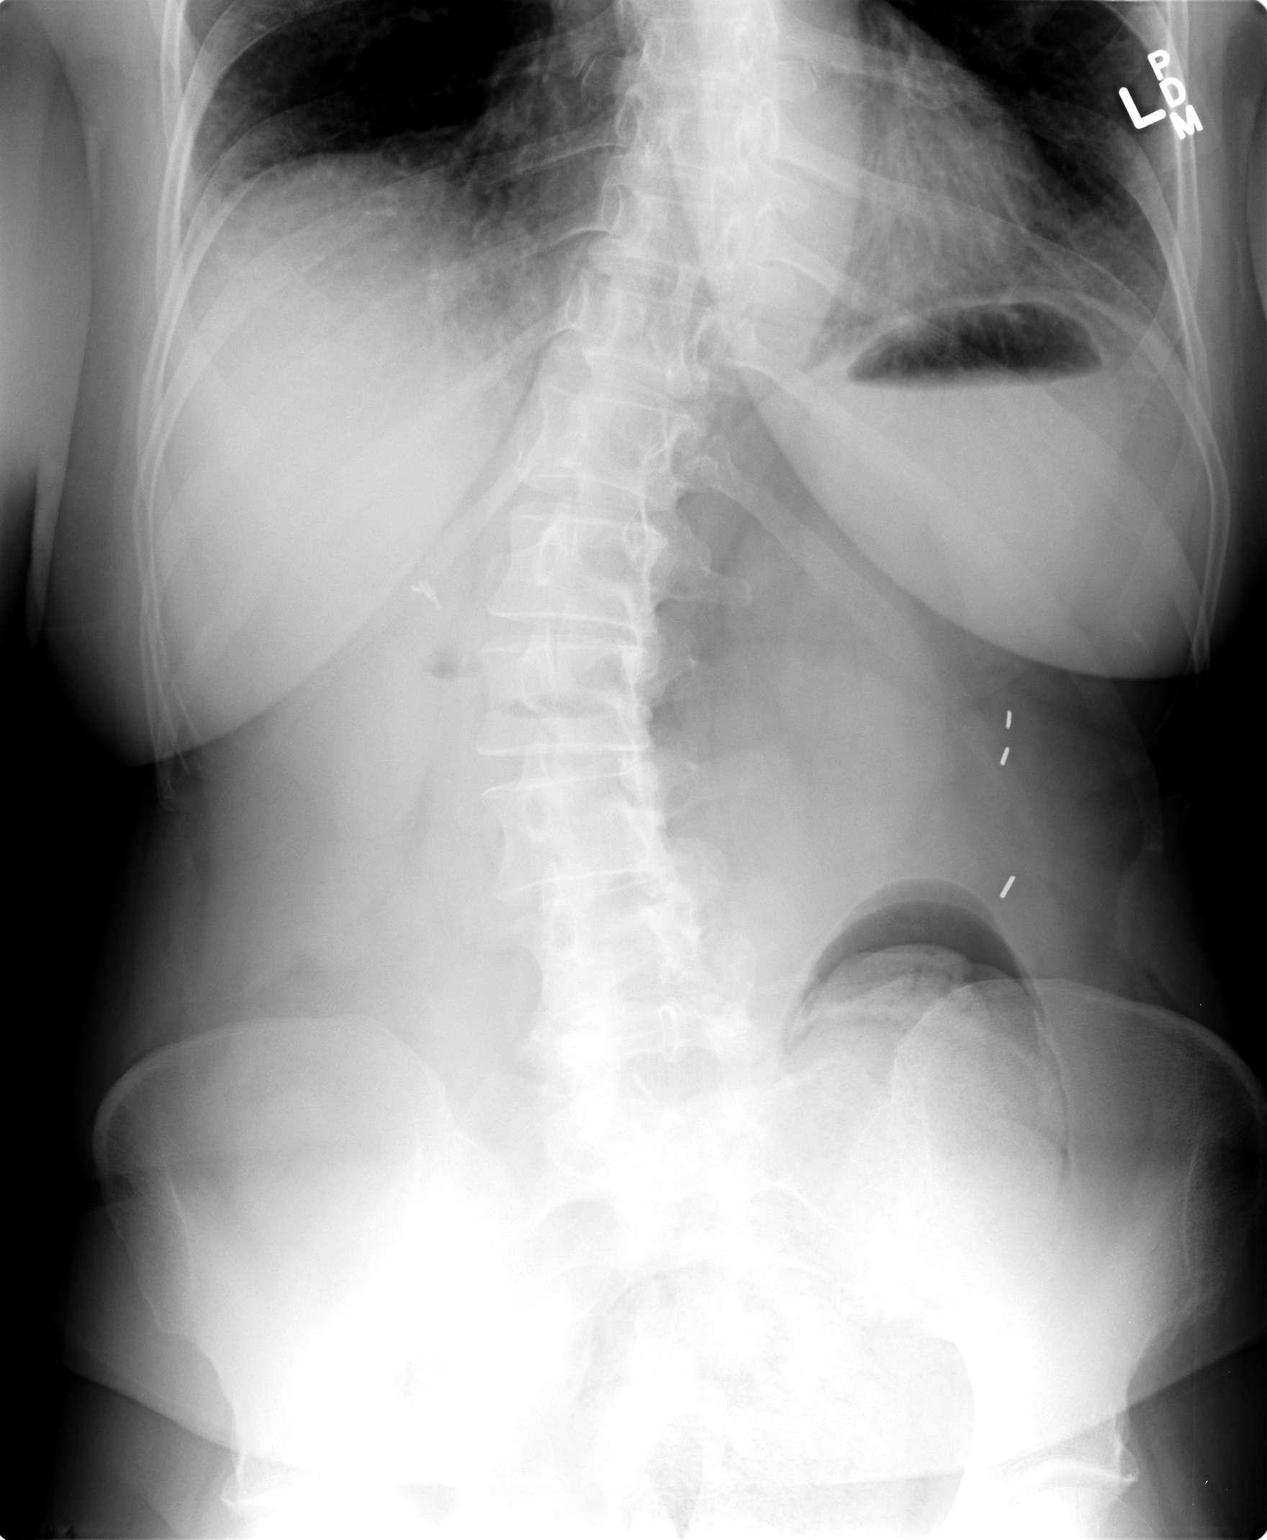

[2 of 2 positions shown; findings below may reference images not displayed]

FINDINGS: There is a large amount of stool in the rectosigmoid in a pattern
consistent with fecal impaction. This is similar to that seen
previously. More proximally the colon and small bowel appear normal.
There is air in fluid in the stomach. There is there are surgical
clips in the gallbladder fossa. There is mild curvature of the
lumbar spine convex toward the right which may be positional.
IMPRESSION: The bowel gas pattern is consistent with constipation and likely
fecal impaction.

## 2016-09-23 IMAGING — CR DG FOREARM 2V*R*
2 series · 2 of 2 positions shown · non-contrast
Comparison: None.

CLINICAL DATA: Recent fall on hyperflexed wrist, initial encounter.

EXAM:
RIGHT FOREARM - 2 VIEW

[x forearm ap right]
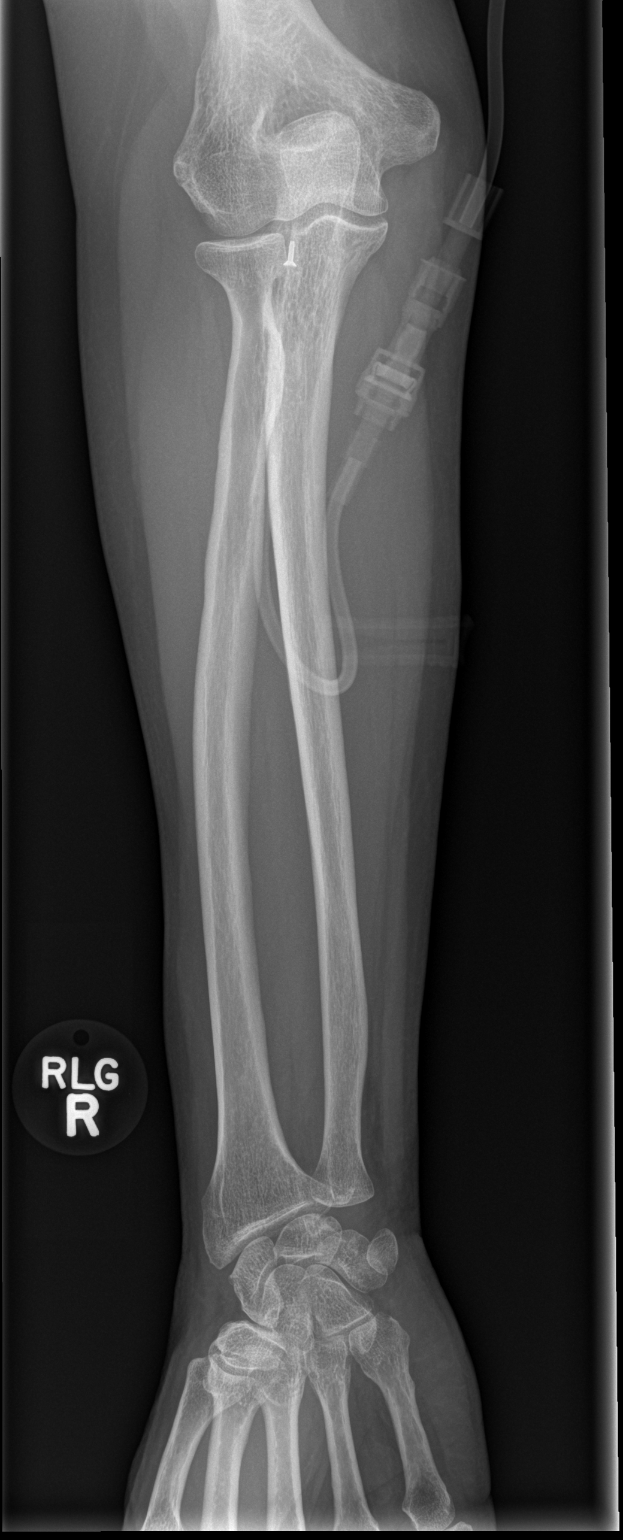

[x forearm lat right]
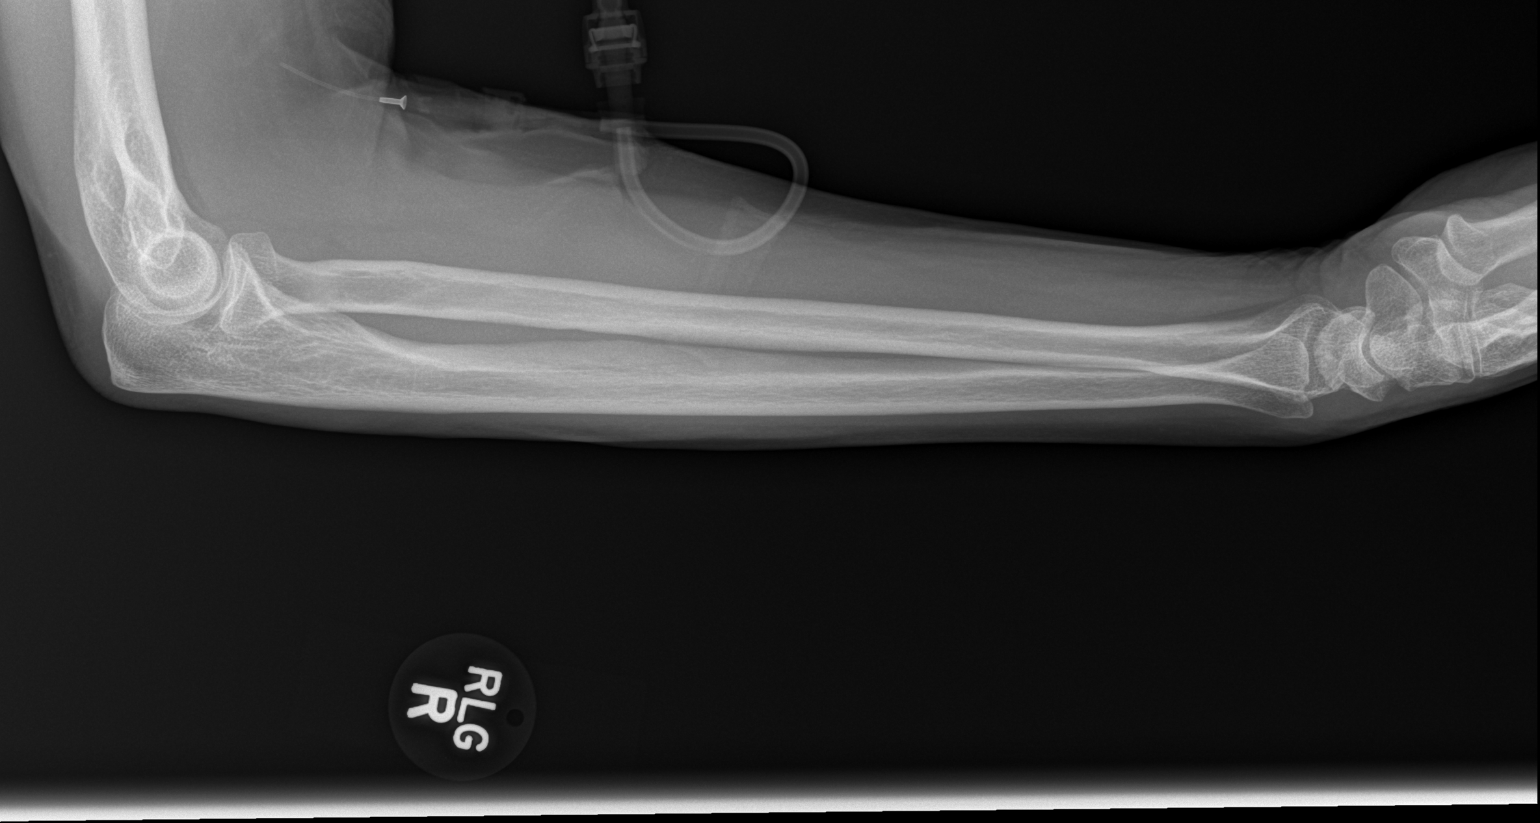

[2 of 2 positions shown; findings below may reference images not displayed]

FINDINGS: There is no evidence of fracture or other focal bone lesions. Soft
tissues are unremarkable.
IMPRESSION: No acute abnormality noted.
